# Patient Record
Sex: Male | Born: 1935 | Race: White | Hispanic: No | Marital: Married | Smoking: Former smoker
Health system: Southern US, Community
[De-identification: ages and names within clinical notes are randomized; demographics above are authoritative.]

## PROBLEM LIST (undated history)

## (undated) DIAGNOSIS — E559 Vitamin D deficiency, unspecified: Secondary | ICD-10-CM

## (undated) DIAGNOSIS — N200 Calculus of kidney: Secondary | ICD-10-CM

## (undated) DIAGNOSIS — E785 Hyperlipidemia, unspecified: Secondary | ICD-10-CM

## (undated) DIAGNOSIS — I219 Acute myocardial infarction, unspecified: Secondary | ICD-10-CM

## (undated) DIAGNOSIS — I10 Essential (primary) hypertension: Secondary | ICD-10-CM

## (undated) DIAGNOSIS — J449 Chronic obstructive pulmonary disease, unspecified: Secondary | ICD-10-CM

## (undated) DIAGNOSIS — I639 Cerebral infarction, unspecified: Secondary | ICD-10-CM

## (undated) DIAGNOSIS — N400 Enlarged prostate without lower urinary tract symptoms: Secondary | ICD-10-CM

## (undated) DIAGNOSIS — I2510 Atherosclerotic heart disease of native coronary artery without angina pectoris: Secondary | ICD-10-CM

## (undated) HISTORY — DX: Hyperlipidemia, unspecified: E78.5

## (undated) HISTORY — DX: Vitamin D deficiency, unspecified: E55.9

## (undated) HISTORY — DX: Benign prostatic hyperplasia without lower urinary tract symptoms: N40.0

## (undated) HISTORY — DX: Essential (primary) hypertension: I10

## (undated) HISTORY — DX: Calculus of kidney: N20.0

## (undated) HISTORY — PX: SHOULDER SURGERY: SHX246

## (undated) HISTORY — DX: Atherosclerotic heart disease of native coronary artery without angina pectoris: I25.10

## (undated) HISTORY — DX: Chronic obstructive pulmonary disease, unspecified: J44.9

## (undated) HISTORY — DX: Acute myocardial infarction, unspecified: I21.9

## (undated) HISTORY — PX: OTHER SURGICAL HISTORY: SHX169

---

## 1997-11-11 ENCOUNTER — Ambulatory Visit (HOSPITAL_COMMUNITY): Admission: RE | Admit: 1997-11-11 | Discharge: 1997-11-11 | Payer: Self-pay | Admitting: Cardiology

## 1998-03-22 ENCOUNTER — Ambulatory Visit (HOSPITAL_COMMUNITY): Admission: RE | Admit: 1998-03-22 | Discharge: 1998-03-22 | Payer: Self-pay | Admitting: *Deleted

## 1998-04-21 ENCOUNTER — Ambulatory Visit (HOSPITAL_COMMUNITY): Admission: RE | Admit: 1998-04-21 | Discharge: 1998-04-21 | Payer: Self-pay | Admitting: Gastroenterology

## 1999-03-28 ENCOUNTER — Ambulatory Visit (HOSPITAL_COMMUNITY): Admission: RE | Admit: 1999-03-28 | Discharge: 1999-03-28 | Payer: Self-pay | Admitting: *Deleted

## 1999-10-30 ENCOUNTER — Encounter: Payer: Self-pay | Admitting: Oncology

## 1999-10-30 ENCOUNTER — Ambulatory Visit (HOSPITAL_COMMUNITY): Admission: RE | Admit: 1999-10-30 | Discharge: 1999-10-30 | Payer: Self-pay | Admitting: Oncology

## 1999-11-09 ENCOUNTER — Encounter: Payer: Self-pay | Admitting: Specialist

## 1999-11-09 ENCOUNTER — Ambulatory Visit (HOSPITAL_COMMUNITY): Admission: RE | Admit: 1999-11-09 | Discharge: 1999-11-09 | Payer: Self-pay | Admitting: Specialist

## 2000-04-15 ENCOUNTER — Ambulatory Visit (HOSPITAL_COMMUNITY): Admission: RE | Admit: 2000-04-15 | Discharge: 2000-04-15 | Payer: Self-pay | Admitting: *Deleted

## 2000-05-07 ENCOUNTER — Ambulatory Visit: Admission: RE | Admit: 2000-05-07 | Discharge: 2000-05-07 | Payer: Self-pay | Admitting: Internal Medicine

## 2000-05-07 ENCOUNTER — Encounter (INDEPENDENT_AMBULATORY_CARE_PROVIDER_SITE_OTHER): Payer: Self-pay | Admitting: Specialist

## 2001-04-17 ENCOUNTER — Encounter: Payer: Self-pay | Admitting: Internal Medicine

## 2001-04-17 ENCOUNTER — Ambulatory Visit (HOSPITAL_COMMUNITY): Admission: RE | Admit: 2001-04-17 | Discharge: 2001-04-17 | Payer: Self-pay | Admitting: Internal Medicine

## 2002-07-08 LAB — HM COLONOSCOPY

## 2002-08-25 ENCOUNTER — Ambulatory Visit (HOSPITAL_COMMUNITY): Admission: RE | Admit: 2002-08-25 | Discharge: 2002-08-25 | Payer: Self-pay | Admitting: Internal Medicine

## 2002-08-25 ENCOUNTER — Encounter: Payer: Self-pay | Admitting: Internal Medicine

## 2003-06-24 ENCOUNTER — Ambulatory Visit (HOSPITAL_COMMUNITY): Admission: RE | Admit: 2003-06-24 | Discharge: 2003-06-24 | Payer: Self-pay | Admitting: Gastroenterology

## 2003-09-28 ENCOUNTER — Ambulatory Visit (HOSPITAL_COMMUNITY): Admission: RE | Admit: 2003-09-28 | Discharge: 2003-09-28 | Payer: Self-pay | Admitting: Internal Medicine

## 2003-12-14 ENCOUNTER — Observation Stay (HOSPITAL_COMMUNITY): Admission: EM | Admit: 2003-12-14 | Discharge: 2003-12-16 | Payer: Self-pay | Admitting: Emergency Medicine

## 2004-06-15 ENCOUNTER — Encounter: Admission: RE | Admit: 2004-06-15 | Discharge: 2004-06-15 | Payer: Self-pay | Admitting: Vascular Surgery

## 2004-09-07 ENCOUNTER — Encounter: Admission: RE | Admit: 2004-09-07 | Discharge: 2004-09-07 | Payer: Self-pay | Admitting: Vascular Surgery

## 2004-11-12 ENCOUNTER — Ambulatory Visit (HOSPITAL_COMMUNITY): Admission: RE | Admit: 2004-11-12 | Discharge: 2004-11-12 | Payer: Self-pay | Admitting: Internal Medicine

## 2005-03-22 ENCOUNTER — Encounter: Admission: RE | Admit: 2005-03-22 | Discharge: 2005-03-22 | Payer: Self-pay | Admitting: Vascular Surgery

## 2005-07-12 ENCOUNTER — Ambulatory Visit (HOSPITAL_COMMUNITY): Admission: RE | Admit: 2005-07-12 | Discharge: 2005-07-12 | Payer: Self-pay | Admitting: Internal Medicine

## 2005-09-27 ENCOUNTER — Encounter: Admission: RE | Admit: 2005-09-27 | Discharge: 2005-09-27 | Payer: Self-pay | Admitting: Vascular Surgery

## 2005-10-23 ENCOUNTER — Inpatient Hospital Stay (HOSPITAL_COMMUNITY): Admission: RE | Admit: 2005-10-23 | Discharge: 2005-10-27 | Payer: Self-pay | Admitting: Vascular Surgery

## 2005-10-23 HISTORY — PX: ABDOMINAL AORTIC ANEURYSM REPAIR: SUR1152

## 2005-10-24 ENCOUNTER — Encounter: Payer: Self-pay | Admitting: Vascular Surgery

## 2005-11-15 ENCOUNTER — Encounter: Admission: RE | Admit: 2005-11-15 | Discharge: 2005-11-15 | Payer: Self-pay | Admitting: Vascular Surgery

## 2006-03-07 ENCOUNTER — Encounter: Admission: RE | Admit: 2006-03-07 | Discharge: 2006-03-07 | Payer: Self-pay | Admitting: Vascular Surgery

## 2006-06-06 ENCOUNTER — Encounter: Admission: RE | Admit: 2006-06-06 | Discharge: 2006-06-06 | Payer: Self-pay | Admitting: Vascular Surgery

## 2006-10-10 ENCOUNTER — Encounter: Admission: RE | Admit: 2006-10-10 | Discharge: 2006-10-10 | Payer: Self-pay | Admitting: Vascular Surgery

## 2006-10-10 ENCOUNTER — Ambulatory Visit: Payer: Self-pay | Admitting: Vascular Surgery

## 2007-11-04 ENCOUNTER — Ambulatory Visit: Payer: Self-pay | Admitting: Vascular Surgery

## 2007-11-04 ENCOUNTER — Encounter: Admission: RE | Admit: 2007-11-04 | Discharge: 2007-11-04 | Payer: Self-pay | Admitting: Vascular Surgery

## 2008-10-19 ENCOUNTER — Ambulatory Visit: Payer: Self-pay | Admitting: Vascular Surgery

## 2009-10-18 ENCOUNTER — Ambulatory Visit: Payer: Self-pay | Admitting: Vascular Surgery

## 2009-10-18 ENCOUNTER — Encounter: Admission: RE | Admit: 2009-10-18 | Discharge: 2009-10-18 | Payer: Self-pay | Admitting: Vascular Surgery

## 2010-06-13 ENCOUNTER — Ambulatory Visit (HOSPITAL_COMMUNITY)
Admission: RE | Admit: 2010-06-13 | Discharge: 2010-06-13 | Payer: Self-pay | Source: Home / Self Care | Admitting: Internal Medicine

## 2010-06-29 ENCOUNTER — Ambulatory Visit (HOSPITAL_COMMUNITY)
Admission: RE | Admit: 2010-06-29 | Discharge: 2010-06-29 | Payer: Self-pay | Source: Home / Self Care | Attending: Internal Medicine | Admitting: Internal Medicine

## 2010-07-29 ENCOUNTER — Encounter: Payer: Self-pay | Admitting: Vascular Surgery

## 2010-11-02 ENCOUNTER — Encounter (INDEPENDENT_AMBULATORY_CARE_PROVIDER_SITE_OTHER): Payer: Medicare Other

## 2010-11-02 DIAGNOSIS — Z48812 Encounter for surgical aftercare following surgery on the circulatory system: Secondary | ICD-10-CM

## 2010-11-02 DIAGNOSIS — I714 Abdominal aortic aneurysm, without rupture: Secondary | ICD-10-CM

## 2010-11-09 NOTE — Procedures (Unsigned)
VASCULAR LAB EXAM  INDICATION:  Followup AAA endograft placed 10/23/2005.  HISTORY: Diabetes:  Yes. Cardiac:  MI. Hypertension:  No.  EXAM:  AAA sac size 3.3 cm AP, 3.9 cm transverse. Previous sac size 10/19/2008 3.3 cm AP, 4.4 cm transverse.  IMPRESSION: 1. The aorta and endograft appear patent. 2. No significant change in size of the aneurysmal sac surrounding the     endograft. 3. No evidence of endoleak was detected. 4. Limited visualization due to overlying bowel gas.  ___________________________________________ Jessy Oto. Oneida Alar, MD  EM/MEDQ  D:  11/05/2010  T:  11/05/2010  Job:  [id number]

## 2010-11-20 NOTE — Assessment & Plan Note (Signed)
OFFICE VISIT   [name], Isaiah Jackson  DOB:  [birth year]                                       11/04/2007  [id number]   The patient returns for followup today.  He had an aortic aneurysm stent  graft placed in April of 2007.  He has done well since then.  He has no  complaints of abdominal or back pain.  He is continuing to eat well.  He  has officially retired and is now mainly spending time at [employer] doing  some fishing   PHYSICAL EXAMINATION:  Vital signs:  On physical exam today blood  pressure is 123/69 in the left arm, pulse is 64 and regular.  Abdomen:  Abdomen is soft, nontender, no pulsatile mass.  Extremities:  He has 2+  femoral and dorsalis pedis pulses bilaterally.  Feet are pink, warm and  well-perfused.   He had a CT scan of the abdomen and pelvis performed today which showed  some thrombus lining the inner lumen of the stent at its midportion  primarily around the origins of the limbs of the graft.  This was  present 1 year ago and is unchanged.  The aneurysm diameter is 4.6 cm.  The aneurysm was originally 5.1 cm prior to stenting.  The stent begins  at the lower aspect of the superior mesenteric artery and is unchanged.  Renal arteries are patent bilaterally.   Overall the patient is doing well.  He is now 2 years out from his stent  graft repair.  We will follow up in 1 year with an abdominal aortic  ultrasound for stent grafts in our office.   Jessy Oto. Fields, MD  Electronically Signed   CEF/MEDQ  D:  11/04/2007  T:  11/05/2007  Job:  [id number]   cc:   Darcus Austin, D.O.  Ezzard Standing, M.D.

## 2010-11-20 NOTE — Procedures (Signed)
ENDOVASCULAR STENT GRAFT EXAM   INDICATION:  Stent graft repair of abdominal aortic aneurysm on  10/23/05.   HISTORY:                           DUPLEX EVALUATION   AAA Sac Size:                 3.3 CM AP             4.4 CM TRV  Previous Sac Size:            3.9 CM AP             4.6, 11/04/07 CM TRV  Evidence of an endoleak?      No                    No   Velocity Criteria:  Proximal Aorta                159 cm/sec  Proximal Stent Graft          127 cm/sec  Main Body Stent Graft-Mid     137 cm/sec  Right Limb-Proximal           118 cm/sec  Right Limb-Distal             118 cm/sec  Left Limb-Proximal            120 cm/sec  Left Limb-Distal              107 cm/sec  Patent Renal Arteries?        Yes                   Yes   IMPRESSION:  Patent aorto-iliac endostent with no evidence of endoleak.   ___________________________________________  Jessy Oto. Fields, MD   MC/MEDQ  D:  10/19/2008  T:  10/19/2008  Job:  [id number]

## 2010-11-20 NOTE — Procedures (Signed)
LOWER EXTREMITY ARTERIAL EVALUATION-SINGLE LEVEL   INDICATION:  Follow-up evaluation of aorto-iliac stent graft.   HISTORY:  Diabetes:  Yes.  Cardiac:  MI.  Hypertension:  No.  Smoking:  Patient is a former smoker for 40 years.  Previous Surgery:  Stent graft of abdominal aortic aneurysm in 10/2005.   RESTING SYSTOLIC PRESSURES: (ABI)                          RIGHT                LEFT  Brachial:               158                  160  Anterior tibial:        158                  164  Posterior tibial:       182 (>1.0)           180 (>1.0)  Peroneal:  DOPPLER WAVEFORM ANALYSIS:  Anterior tibial:        Biphasic             Triphasic  Posterior tibial:       Biphasic             Triphasic  Peroneal:   PREVIOUS ABI'S:  Date: 10/24/05  RIGHT:  >1.0  LEFT:  >1.0   IMPRESSION:  Ankle brachial indices are stable compared to previous  study bilaterally and suggest no significant arterial occlusive disease.   ___________________________________________  Jessy Oto. Fields, MD   MC/MEDQ  D:  10/19/2008  T:  10/19/2008  Job:  [id number]

## 2010-11-23 NOTE — Op Note (Signed)
Isaiah Jackson, CONSTABLE NO.:  [id number]   MEDICAL RECORD NO.:  [id number]          PATIENT TYPE:  INP   LOCATION:  3301                         FACILITY:  [facility]   PHYSICIAN:  Dorothea Glassman, M.D.    DATE OF BIRTH:  [birth year]   DATE OF PROCEDURE:  10/23/2005  DATE OF DISCHARGE:                                 OPERATIVE REPORT   SURGEON:  Gordy Clement, M.D.   ASSISTANT:  Ruta Hinds, M.D.   DIAGNOSIS:  A 5.1-cm abdominal aortic aneurysm.   PROCEDURE:  Open left common femoral artery exposure for placement of Zenith  bifurcated aortic stent graft.   OPERATIVE PROCEDURE:  The patient brought to the operating room in stable  condition.  Placed in supine position.  General endotracheal anesthesia  induced.  Abdomen and both legs prepped and draped in sterile fashion.   Oblique skin incision made through the left groin at the level of the  inguinal ligament.  Dissection carried through subcutaneous tissue with  electrocautery.  Deep dissection carried down to expose the inguinal  ligament.  The common femoral artery exposed and encircled with vessel loop  proximally.  Bridging veins were ligated with 3-0 silk and divided.  Common  femoral artery mobilized down to the origin of the profunda and superficial  femoral arteries and encircled with vessel loop distally.   The common femoral artery was soft, large, a good caliber, minimal disease.   A Zenith bifurcated aortic stent graft was then placed, dictated under  separate heading by Dr. Oneida Alar.   At completion of the aortic stent graft procedure, all sheaths and  guidewires and catheters were removed.  The left common femoral artery  controlled with DeBakey clamps proximally and distally.  The transverse  arteriotomy was closed with running 6-0 Prolene suture.  The wound then  irrigated with saline solution.  Subcutaneous tissue closed with deep layer  of running 2-0 Vicryl suture.  Subcutaneous layer  running 3-0 Vicryl suture.  Skin closed with 4-0 Monocryl.  Steri-Strips applied.  The patient remained  stable throughout the operative procedure.      Dorothea Glassman, M.D.  Electronically Signed     PGH/MEDQ  D:  10/23/2005  T:  10/24/2005  Job:  [id number]

## 2010-11-23 NOTE — Discharge Summary (Signed)
Isaiah Jackson, Isaiah Jackson NO.:  [id number]   MEDICAL RECORD NO.:  [id number]           PATIENT TYPE:   LOCATION:                               FACILITY:  [facility]   PHYSICIAN:  Jessy Oto. Fields, MD  DATE OF BIRTH:  Mar 02, 1934   DATE OF ADMISSION:  10/23/2005  DATE OF DISCHARGE:                                 DISCHARGE SUMMARY   ADMISSION DIAGNOSIS:  Abdominal aortic aneurysm.   DISCHARGE/SECONDARY DIAGNOSES:  1.  Abdominal aortic aneurysm.  2.  Hypertension.  3.  Diabetes mellitus, type 2.  4.  Coronary artery disease, status post PTA and stenting.  5.  History of myocardial infarction.  6.  Hyperlipidemia.  7.  Obesity.  8.  Benign prostatic hypertrophy with history of post catheterization,      urinary retention requiring Foley catheter placement.  9.  Nephrolithiasis with history of cystoscopy.   ALLERGIES:  No known drug allergies.   PROCEDURES:  October 23, 2005:  Venous aneurysm stent graft repair of  abdominal aortic aneurysm (main body venous graft 32 x 111, left limb 14 x  88, right limb 16 x 71).  Surgeons:  Isaiah Jackson and Isaiah Jackson.   BRIEF HISTORY AND PHYSICAL:  Isaiah Jackson is a 77 year old Caucasian male who  was found to have an abdominal aortic aneurysm in June of 2005 which  measured 5.2 cm.  The patient had been completely asymptomatic.  He was  referred to vascular surgeon Isaiah Jackson, who recommended  surveillance scans every six months.  He continued to follow up the regular  intervals and his aneurysm remained stable.  He recently underwent CT angio  on September 27, 2005, which revealed his aneurysm to be 5.1 cm in diameter with  26 mm diameter neck and adequate neck length for stent graft repair.  Once  again Isaiah Jackson felt that the patient's risk for rupture was low and he  could continue routine surveillance.  However, at the time the patient  desired to proceed with surgical intervention.  Isaiah Jackson discussed  a  repair alternative and ultimately it was felt he was a good candidate for  stent graft repair.  The patient was seen by Isaiah Jackson preoperatively and  was cleared to proceed with the surgery from a cardiac standpoint.   HOSPITAL COURSE:  On October 23, 2005, Isaiah Jackson was electively admitted to  [facility] and did undergo stent graft repair of his abdominal  aortic aneurysm.  At the time of this dictation he has remained stable  throughout his postoperative course, although he has been monitored for  leukocytosis with a white count peaking at 19,000 with follow-up white count  beginning to trend down.  There was a follow-up CBC ordered but is still  pending.  He has had a low grade temperature peaking at 100.7 which is felt  possibly secondary to atelectasis.  A urinalysis was checked to look for  possible other sources, but is negative for leukocyte nitrites.  At this  time he has not had a postoperative chest  x-ray but his lung sounds have  remained clear and his saturations have been __________ % on room air.  Postoperatively, Isaiah Jackson has been comfortable from a pain standpoint.  His incisions also appear to be healing well without signs of infection or  evidence of hematoma.  He has been able to mobilize without difficulty.  He  has been advanced to a regular carbohydrate modified diet and his home  medications have been resumed.  Initially it was anticipated he would be  ready for discharge home on postoperative day #2, but developed nausea and  at this point without vomiting.  He has had no abdominal pain and his  physical exam shows excellent bowel sounds, although his abdomen is  moderately distended.  It remains soft and nontender.  His nausea was  treated with Zofran and Phenergan p.r.n. and Dulcolax suppository was given  to simulate his bowel function.  He was encouraged to increase his  mobilization.  It was determined he should remain hospitalized for an   additional day to monitor his bowel function and to give his nausea a chance  to subside.  Will also follow up on his lab results, specifically his white  blood count and follow up his basic metabolic panel as well.  If his nausea  has resolved and there are no significant changes in status, it is  anticipated he will be ready for discharge home on postoperative day #3,  October 26, 2005.   LABORATORY DATA:  His most recent labs show a white blood count of 19,600,  hemoglobin 10.8, hematocrit of 30.8, platelet count 190.  Sodium 135,  potassium 3.5, chloride 105, CO2 25, blood glucose 145, BUN 15, creatinine  1.3, calcium 8.3.  A follow-up CBC and BMET have been ordered but are still  pending at the time of this dictation.  I previously had stated his white  blood count had been trending down; however, after further review, it  appears that his white blood count postoperatively had remained stable at  first 19.4, then increased to 19.6, again with the follow-up WBC still  pending.  His preoperative liver function tests were normal, showing a total  bilirubin of 0.3, alkaline phosphatase 57, AST 24, ALT of 18, total protein  6.2, and a__________ was 3.8.   DISCHARGE MEDICATIONS:  1.  Toprol XL 50 mg p.o. daily.  2.  Actos 45 mg p.o. daily.  3.  Lipitor 40 mg p.o. daily.  4.  Tricor 145 mg p.o. daily.  5.  Aspirin 81 mg p.o. daily.  6.  Oxycodone 5 mg 1-2 tablets p.o. q.4-6 h. p.r.n. pain.   DISCHARGE INSTRUCTIONS:   DIET:  He is to follow a diabetic low fat, low salt diet.   ACTIVITY:  He is to avoid driving or heavy lifting for the next two weeks.  He should increase his activity slowly and was encouraged to continue daily  walking and breathing exercises.   WOUND CARE:  He may shower and clean his incision gently with soap and  water.  He should notify the [facility] office if he develops fever greater than 101, redness or drainage from his incision site, or abdominal pain or   increasing abdominal distention, or nausea or vomiting.  Of note his feet  have remained well perfused during this hospitalization.   FOLLOW UP:  He is to follow up with Isaiah Jackson at the [place] office per stent  graft protocol and he will be contacted regarding a specific  appointment  date and time.      Jacinta Shoe, P.A.    ______________________________  Jessy Oto Fields, MD    AWZ/MEDQ  D:  10/25/2005  T:  10/26/2005  Job:  [id number]   cc:   Jessy Oto. [address], [place], [place] [address]   W. Tollie Eth, M.D.  Fax: [phone]  Email: [email or web address]   Darcus Austin, D.O.  Fax: [phone]

## 2010-11-23 NOTE — H&P (Signed)
NAMERAMIR, KALLAY NO.:  [id number]   MEDICAL RECORD NO.:  [id number]          PATIENT TYPE:  INP   LOCATION:  NA                           FACILITY:  [facility]   PHYSICIAN:  Jessy Oto. Fields, MD  DATE OF BIRTH:  1934-05-04   DATE OF ADMISSION:  DATE OF DISCHARGE:                                HISTORY & PHYSICAL   REASON FOR ADMISSION:  Abdominal aortic aneurysm.   HISTORY OF PRESENT ILLNESS:  The patient is a 77 year old white male who was  found in June 2005 to have an abdominal aortic aneurysm which at that time  measured 5.2 cm.  The patient been completely asymptomatic.  He was seen in  consultation by Dr. Ruta Hinds, and because he had been asymptomatic and  based on the size of the aneurysm, Dr. Oneida Alar recommended follow-up  surveillance scans every six months.  He has continued to follow up at  regular intervals, and his aneurysm has remained stable.  He continues to  remain asymptomatic as well, denying abdominal pain or back pain.  He  recently underwent a CT angiogram on September 27, 2005 which revealed his  aneurysm to be 5.1 cm in diameter with 26-mm diameter neck and adequate neck  length for stent graft repair.  Once again, Dr. Oneida Alar felt that the  patient's risk of rupture was low and that he can continue routine  surveillance; however, at this time, the patient desires to proceed with  surgical intervention.  Dr. Oneida Alar has discussed the risks, benefits and  alternatives of repair, and based on his discussions with the patient, it  was decided to proceed with stent graft repair at this time.  Mr. Sport  has been seen by Dr. Wynonia Lawman preoperatively and has been cleared to proceed  with surgery from a cardiac standpoint.  He presents today for preoperative  history and physical.   PAST MEDICAL HISTORY:  1.  History of hypertension, currently on no medications secondary to      dizziness with his medications.  2.  Type 2 diabetes mellitus.  3.  Coronary artery disease status post PTA and stent.  4.  History of MI.  5.  Hyperlipidemia  6.  Obesity.  7.  History of benign prostatic hypertrophy with a history of post      catheterization urinary retention requiring Foley catheter placement.  8.  History of kidney stones.   PAST SURGICAL HISTORY:  Cystoscopy for kidney stones.   ALLERGIES:  No known drug allergies.   MEDICATIONS:  1.  Actos 45 mg q.d.  2.  Lipitor 40 mg q.d.  3.  TriCor 145 mg q.d.  4.  Aspirin 81 mg q.d.  5.  Toprol XL 50 mg; this was started one week prior to surgery by Dr.      Wynonia Lawman and is to be continued one week postoperatively   SOCIAL HISTORY:  He is married and resides with his wife in [place].  He  has two children.  He previously smoked one pack of cigarettes per day for  30 years and quit in 1992.  He does not consume alcohol.  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   FAMILY HISTORY:  His mother died age at 65 of a myocardial infarction.  She  also had diabetes.  His father died when the patient was quite young an  accidental death.  He has one sister living who has diabetes and one sister  living of whose medical history he is unaware.  He has one daughter who has  diabetes as well.   REVIEW OF SYSTEMS:  See history of present illness for pertinent positives  and negatives.  Also, he has had problems recently with the swelling in his  right knee and is scheduled for further workup once the aneurysm repair is  completed.  He has occasional dizzy spells, but this have improved since he  was been taken off his hypertensive medications.  He wears glasses.  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  He complains of nocturia.  He also has  hemorrhoids.  He has some arthritis which he treats conservatively.  He  denies fevers, chills, weight loss, recent infections, TIA symptoms, visual  changes, amaurosis fugax, dysphagia, dysarthria, syncope,  chest pain,  palpitations shortness of breath, dyspnea on exertion, orthopnea, PND,  hemoptysis, hematemesis, hematochezia, melena, abdominal pain, nausea,  vomiting, diarrhea, constipation, reflux symptoms, dysuria, hematuria, lower  extremity edema, lower extremity claudication symptoms, rest pain,  nonhealing ulcers, intolerance to heat or cold, anxiety, depression, muscle  or joint problems.   PHYSICAL EXAMINATION:  VITAL SIGNS:  Blood pressure is 118/70, heart rate 60  and regular, respirations 16 and unlabored.  GENERAL:  This is a well-developed, well-nourished white male in no acute  distress.  HEENT: Normocephalic, atraumatic.  Pupils equal, round and reactive to light  and accommodation.  Extraocular movements intact.  TMs and canals are clear.  Oropharynx is clear with moist mucous membranes and upper and lower dentures  in place.  NECK:  Supple without lymphadenopathy, thyromegaly or carotid bruits.  HEART:  Regular rate and rhythm without murmurs, rubs or gallops.  LUNGS:  Clear to auscultation.  ABDOMEN:  Soft, nontender, nondistended, obese with active bowel sounds in  all quadrants.  There is a pulsatile midline mass to deep palpation.  EXTREMITIES:  No clubbing, cyanosis or edema.  He has 2+ femoral, dorsalis  pedis and posterior tibial pulses bilaterally.  NEUROLOGIC:  Cranial nerves II-XII grossly intact.  He is alert and oriented  x3.  His gait is within normal limits.  Muscle strength is 5+ in the upper  and lower extremities and symmetrical.   ASSESSMENT/PLAN:  This is a 77 year old male with an asymptomatic abdominal  aortic aneurysm.  He will be admitted to [facility] on October 23, 2005 for stent graft repair by Dr. Ruta Hinds.      Suzzanne Cloud, P.A.    ______________________________  Jessy Oto. Fields, MD    GC/MEDQ  D:  10/18/2005  T:  10/18/2005  Job:  [id number]  cc:   Darcus Austin, D.O.  [phone]   W. Tollie Eth, M.D.   Fax: [phone]  Email: [email or web address]

## 2010-11-23 NOTE — Op Note (Signed)
NAMEBONNER, Isaiah NO.:  [id number]   MEDICAL RECORD NO.:  [id number]          PATIENT TYPE:  INP   LOCATION:  3301                         FACILITY:  [facility]   PHYSICIAN:  Jessy Oto. Fields, MD  DATE OF BIRTH:  06-07-1934   DATE OF PROCEDURE:  10/23/2005  DATE OF DISCHARGE:                                 OPERATIVE REPORT   PROCEDURE:  Zenith aneurysm stent graft repair of abdominal aortic aneurysm.   PREOPERATIVE DIAGNOSIS:  Abdominal aortic aneurysm.   POSTOPERATIVE DIAGNOSIS:  Abdominal aortic aneurysm.   ANESTHESIA:  General.   INDICATIONS:  The patient is a 77 year old male with history of a 5.1-cm  abdominal aortic aneurysm.  He now presents for elective repair.  Options of  waiting until the aneurysm was 5.5 cm diameter were explained to the patient  but he is very anxious about the aneurysm and wishes to have it repaired  electively at this time.   OPERATIVE FINDINGS:  1.  Main body Zenith graft 32 x 111.  2.  Left limb 14 x 88.  3.  Right limb 16 x 71.   OPERATIVE DETAIL:  After obtaining informed consent, the patient taken the  operating room.  The patient placed supine position operating table.  After  induction of general anesthesia endotracheal intubation, a Foley catheter  was placed.  Next the patient's abdomen was prepped and draped usual sterile  fashion.  Bilateral groin incisions were made over the common femoral  artery.  The left common femoral artery dissection then repair is dictated  as separate operative note by Dr. Amedeo Plenty.  On the right side incision was  carried down through subcutaneous tissues down to level of right common  femoral artery.  This dissected free circumferentially and controlled  proximally and distally with vessel loops.  Next a Majestic needle was used  to cannulate the right common femoral artery.  A 0.035 J-tip guidewire was  threaded through the right common femoral artery up into the abdominal aorta  under fluoroscopic guidance.  Next the 8-French sheath was placed over the  guidewire into the right iliac system.  An H1 catheter was then threaded  over the guidewire through the sheath and the J-wire exchange for a 0.035  Amplatz wire.  Next attention was turned to left side.  The left common  femoral artery was cannulated with a Majestic needle.  A 0.035 J-tip  guidewire was then threaded up into the abdominal aorta and a 8-French  sheath placed over the guidewire into the left common femoral artery.  A 5-  French pigtail catheter was then placed over the guidewire into the  abdominal aorta.  Tip of the Amplatz wire was measured to be in the mid  thoracic aorta.  Next a 32 x 111 main body device was brought up to the  operative field and oriented with the gate in a slightly anterior fashion.  The 8-French sheath was removed from the right common femoral artery and the  main body device threaded over the guidewire up into the abdominal aorta.  A  contrast arteriogram  was then obtained using a pigtail catheter to show the  precise location of the renal arteries.  Top portion of the main body the  graft was then deployed in the usual fashion.  This was done with 10 degrees  of craniocaudal angulation.  After the main body device had been deployed  down to the level of the gate.  This was inspected and found to be in good  position below the renal arteries and the suprarenal stent was deployed.  Next the pigtail catheter was pulled down into the left common iliac system.  Using a guidewire this was then threaded into the contralateral limb and the  pigtail catheter advanced up into the abdominal aorta.  This was then  confirmed to be within the lumen of the stent by twirling the catheter as  well as checking lateral fluoroscopy.  A retrograde arteriogram was obtained  through the sheath in the left groin to confirm that a 14 x 88 limb would be  adequate coverage of the left iliac system.   This was then brought up on the  operative field.  The 8-French sheath was removed over the guidewire as well  as the pigtail catheter.  14 x 88 limb was then placed over the guidewire in  the left iliac system and deployed with one and a half stent overlap in the  usual fashion.  Attention was then turned back to the right femoral artery.  The remainder of the ipsilateral limb was deployed and the trigger wire  removed.  The remainder of the ipsilateral limb was then deployed.  The  dilator was then recaptured in the usual fashion.  This was then removed and  a pigtail catheter placed over the guidewire and again a retrograde contrast  angiogram was obtained to show the precise level of the internal iliac  artery on the right side.  Contrast angiogram had also been obtained in the  left iliac system retrograde to determine level of hypogastric artery.  A 16  x 71 limb was selected for the right side.  This was then deployed with  approximately two-stent overlap above the level of the internal iliac  artery.  The dilator was then recaptured.  A Coda molding balloon was then  brought up on the operating field and placed over the guidewire and the  proximal portion of the stent was tacked with this.  Also all joints on the  left and right side were also tacked as well as the distal endpoint.  5-  French pigtail catheter was then placed over the guidewire up into the  abdominal aorta and a completion arteriogram was obtained.  This showed no  evidence of type 2 or type 1 proximal or distal endo leak.  Stent graft was  in good position and both internal iliac arteries were widely patent as well  as both renal arteries.  At this point all guidewires and sheaths were  removed.  The femoral arteries were clamped proximally.  As stated above,  repair of the left artery is dictated by Dr. Amedeo Plenty.  The right common femoral artery was closed with interrupted 6-0 Prolene suture.  Subcutaneous  tissues  reapproximated with running 2-0 Vicryl suture.  The deep layers  closed running 3-0 Vicryl suture.  The skin closed with 4-0 Vicryl  subcuticular stitch.  A left groin was closed in similar fashion.  The  patient tolerated procedure well and there were no complications.  Instrument, sponge, needle counts were correct  at the end of the case.  The  patient was taken the recovery room in stable condition with warm feet with  palpable pedal pulses.           ______________________________  Jessy Oto Fields, MD     CEF/MEDQ  D:  10/23/2005  T:  10/24/2005  Job:  [id number]

## 2010-11-23 NOTE — Cardiovascular Report (Signed)
NAMEELSIE, REE                         ACCOUNT NO.:  [id number]   MEDICAL RECORD NO.:  [id number]                   PATIENT TYPE:  INP   LOCATION:  [id number]                                 FACILITY:  [facility]   PHYSICIAN:  W. Doristine Church., M.D.         DATE OF BIRTH:  [birth year]   DATE OF PROCEDURE:  12/15/2003  DATE OF DISCHARGE:  12/16/2003                              CARDIAC CATHETERIZATION   HISTORY:  A 77 year old male with previous stenting of the LAD years ago for  an anterior infarct.  He presented with increasing nausea, dizziness,  diaphoresis and had a myocardial infarction ruled out.  He also had some  abdominal pain and was found to have a 5.2 cm aneurysm.  He is admitted at  this time for catheterization to exclude unstable angina.   PROCEDURE:  Left heart catheterization with coronary angiogram, left  ventriculogram, distal aortogram.   COMMENTS ABOUT PROCEDURE:  The patient tolerated the procedure well without  complications.  A distal aortogram was obtained following the procedure  without complications.  He had good hemostasis and peripheral pulses noted  following the procedure.   HEMODYNAMIC DATA:  1. Aorta postcontrast:  140/72.  2. LV postcontrast:  140/18-21.   ANGIOGRAPHIC DATA:   LEFT VENTRICULOGRAM:  Performed in the 30 degree RAO projection.  The aortic  valve was normal.  The mitral valve was normal.  The left ventricle appears  normal in size.  There is  moderate anterior hypokinesis noted.  Estimated  ejection fraction is 40-45%.  Coronary arteries arise and distribute  normally.  There is calcification noted involving the left and the right  coronary arteries.  Left main coronary artery is calcified with mild  narrowing.  Left anterior descending previous Cook stent is seen and is  widely patent.  There is no significant residual stenosis noted.  There is  mild-to-moderate 30% irregularity with a somewhat small distal vessel that  terminates in the distal anterior wall.   Circumflex coronary artery:  Single marginal artery has mild-to-moderate 30%  diffuse disease.   Right coronary artery is a large dominant vessel.  There is diffuse 30%  irregularity in the proximal and mid portion of the vessel.   The posterior descending extends to the apex.  Several posterior lateral  branches arise which are free of disease.   Distal aortogram shows renal arteries without significant stenosis.  There  is a somewhat fusiform aneurysmal dilation of the aorta diffusely with a  focal area just below the renal arteries.   IMPRESSION:  1. Coronary artery disease with mild-to-moderate diffuse disease, residual,     with long term patency of the stented     site to the left anterior descending.  2. Abnormal left ventricular function with anterior hypokinesis.  3. Distal abdominal aortic aneurysm.  Kerry Hough., M.D.    WST/MEDQ  D:  12/15/2003  T:  12/16/2003  Job:  [id number]   cc:   Darcus Austin, D.O.  [address]  [place]  [place] [address]  Fax: [phone]

## 2010-11-23 NOTE — Op Note (Signed)
NAME:  Isaiah Jackson, Isaiah Jackson                         ACCOUNT NO.:  [id number]   MEDICAL RECORD NO.:  [id number]                   PATIENT TYPE:  AMB   LOCATION:  ENDO                                 FACILITY:  [facility]   PHYSICIAN:  James L. Rolla Flatten., M.D.          DATE OF BIRTH:  [birth year]   DATE OF PROCEDURE:  06/24/2003  DATE OF DISCHARGE:                                 OPERATIVE REPORT   PROCEDURE:  Colonoscopy.   MEDICATIONS:  Fentanyl 25 mcg, Versed 5 mg IV.   SCOPE:  Olympus pediatric adjustable colonoscope.   INDICATIONS FOR PROCEDURE:  Colon cancer screening.   DESCRIPTION OF PROCEDURE:  The procedure had been explained to the patient  and consent obtained. With the patient in the left lateral decubitus  position, the Olympus pediatric adjustable scope was inserted and advanced.  The prep was excellent. We were able to reach the cecum without difficulty.  The ileocecal valve and appendiceal orifice were seen. The scope was  withdrawn and the cecum, ascending colon, transverse colon, descending and  sigmoid colon were seen well. No polyps or other lesions were seen. The  scope was withdrawn. The patient tolerated the procedure well.   ASSESSMENT:  Normal screening colonoscopy, V76.51.   PLAN:  Routine colonoscopy instructions.  Recommend yearly Hemoccults,  consider another screen in 10 years.                                               James L. Rolla Flatten., M.D.    Jaynie Bream  D:  06/24/2003  T:  06/24/2003  Job:  [id number]   cc:   Darcus Austin, D.O.  [address]  [place]  [place] [address]  Fax: [phone]

## 2010-11-23 NOTE — H&P (Signed)
Isaiah Jackson, Isaiah Jackson                         ACCOUNT NO.:  [id number]   MEDICAL RECORD NO.:  [id number]                   PATIENT TYPE:  INP   LOCATION:  1826                                 FACILITY:  [facility]   PHYSICIAN:  W. Doristine Church., M.D.         DATE OF BIRTH:  05-Mar-1934   DATE OF ADMISSION:  12/14/2003  DATE OF DISCHARGE:                                HISTORY & PHYSICAL   CHIEF COMPLAINT:  Diaphoresis, chest and back pain and nausea.   HISTORY OF PRESENT ILLNESS:  This 77 year old male has a prior history of  hypertension, diabetes and hyperlipidemia.  He several years ago had an  anterior myocardial infarction treated with angioplasty and later had  stenting of the LAD.  He had done well from a cardiac viewpoint, but had  recently been diagnosed with diabetes over the past year.  He presented with  a three to four day episode of episodic spells of dizziness.  He would  become dizzy, nauseous and feel like he might pass out.  One day he had cut  the grass at his daughter's house and shortly afterwards became severely  nauseous, dizziness, broke into a cold sweat and felt as if he might pass  out and went home.  Last evening, after getting a hair cut, he was coming  home and had severe nausea.  He went home, broke out into a diffuse sweat  and had significant nausea and then mid and low back pain.  He laid down but  had then some continued aching in his lower back as well as cramping in his  right leg.  He has not had any arm pain, but because of the symptoms he went  and saw him primary doctor who asked him to see me and he is admitted at  this time to evaluate for a possible unstable angina or acute coronary  syndrome.  He has been having increasing shortness of breath and had been  recently called back to work from his previous employer, but had to cut his  hours back because he did not feel well.   PAST MEDICAL HISTORY:  1. Diabetes.  2. Hypertension.  3.  Hyperlipidemia.  4. Obesity.   PAST SURGICAL HISTORY:  None.   ALLERGIES:  None.   CURRENT MEDICATIONS:  1. Lipitor daily.  2. Tricor daily.  3. Aspirin daily.  4. Actos 15 mg daily.  5. Atenolol daily.   FAMILY HISTORY:  Father died of trauma.  Mother died of diabetes and heart  disease.  Three sisters are living.  One brother died several years ago.   SOCIAL HISTORY:  He has been married.  He quit smoking in 1994 at the time  of his heart attack.  He is a semi-retired Clinical biochemist for [employer]  He does not use alcohol to excess.   REVIEW OF SYMPTOMS:  GENERAL:  His weight tends to fluctuate.  ENDOCRINE:  He has no diabetic retinopathy, glaucoma, cataracts or macular degeneration.  HEENT:  He has no difficulty hearing and no difficulty swallowing.  He has  no eye, ear, nose or throat complaints otherwise.  PULMONARY:  He has a  history of a chronic cough and has been receiving treatment for an upper  respiratory infection recently through Dr. Franklyn Lor office.  CARDIAC:  He  does not have palpitations and does not have definite syncope.  GI:  He  denies any diarrhea, constipation, hematochezia or melena.  GU:  He has no  dysuria, urgency, frequency or hematuria.  MUSCULOSKELETAL:  He does have  arthritis that involves his left knee.  He complains of some cramping  involving his right leg, but no claudication.  NEUROLOGIC:  He has had some  history of headaches but no history of chronic strokes.  He did have a  bronchoscopy several years ago for a possibly abnormal chest x-ray and cough  that was unremarkable.  Other than as noted above, the remainder of the  review of systems is unremarkable.   PHYSICAL EXAMINATION:  GENERAL:  On examination he is a pleasant, obese male  who is currently in no acute distress.  VITAL SIGNS:  Blood pressure was 120/70, pulse was 45.  SKIN: Warm and dry.  HEENT:  There is an erythematous rash involving the right side of the  face.  He has a slightly balding male hair pattern. Eyes:  He wears glasses.  Extraocular movements are intact.  Pupils equal, round and reactive to light  and accommodation.  CNS is clear.  Fundi are unremarkable.  Pharynx is  negative.  The neck is supple without masses, JVD, thyromegaly or bruits.  Lymph nodes are normal.  LUNGS:  Clear to auscultation and percussion.  CARDIAC:  Normal S1 and S2, slow rhythm, no S3 or murmur.  ABDOMEN:  Soft and non-tender.  No masses, hepatosplenomegaly or aneurysm  palpated.  EXTREMITIES:  Femoral pulses are 2+.  Dorsalis pedis and posterior tibialis  pulses are 2+.  There is no edema noted.  NEUROLOGIC:  Normal.  GU AND RECTAL EXAMS:  Deferred due to cardiac condition.   LABORATORY DATA:  EKG shows sinus rhythm with bradycardia, nonspecific ST  and T wave changes, possible old anterior infarction.   Hemoglobin 14.1, hematocrit 40.7, white count 10,300.  PT and PTT are  normal.  Chemistry panel shows a glucose of 153, sodium 134, potassium 3.5,  chloride 103.  Liver enzymes are all normal.   Chest x-ray portable was normal.   IMPRESSION:  1. Atypical back pain with nausea, diaphoresis and near syncope; rule out     arrhythmia, rule out unstable angina or presentation of ischemia.  2. Coronary artery disease with previous stenting fo the left anterior     descending and previous anterior infarction.  3. Hypertension.  4. Hyperlipidemia, under treatment.  5. Type 2 diabetes, under treatment.  6. Obesity.   RECOMMENDATIONS:  1. The patient is brought in at this time and will have a CT scan of the     chest and abdomen to rule out dissection of     the aorta or pulmonary embolus.  2. Serial enzymes will be obtained.  3. Likely consider repeat catheterization if he rules out for a myocardial     infarction.  Kerry Hough., M.D.   WST/MEDQ  D:  12/14/2003  T:  12/14/2003  Job:   [id number]   cc:   Darcus Austin, D.O.  [address]  [place]  [place] [address]  Fax: [phone]

## 2010-11-23 NOTE — Assessment & Plan Note (Signed)
OFFICE VISIT   DAMARIS, REHL  DOB:  August 31, 1933                                       10/18/2009  [id number]   The patient is a 77 year old male who previously underwent aneurysm  stent graft repair in 2007.  This was done with a Zenith graft.  He  returns for followup today.  He has no new medical problems since his  last office visit.  He has no complaints of abdominal or back pain.  He  has no symptoms of claudication.   CHRONIC MEDICAL PROBLEMS:  Include elevated cholesterol and diabetes.  These are currently controlled.   REVIEW OF SYSTEMS:  He is 6 foot 1, 243 pounds.  He denies any shortness  of breath.  He denies any chest pain.   FAMILY HISTORY:  Unremarkable.   SOCIAL HISTORY:  He is retired.  He is a former smoker and quit in 1994.  He is retired.   PHYSICAL EXAM:  Blood pressure 147/75 in the left arm, heart rate 54 and  regular.  Temperature is 97.8.  Chest is clear to auscultation.  Cardiac  exam is regular rate and rhythm.  Abdomen is soft, nontender,  nondistended.  No palpable masses.  He has 2+ femoral pulses  bilaterally.  He has a 2+ right dorsalis pedis pulse and a 2+ left  dorsalis pedis pulse.  He has absent left dorsalis pedis pulse and  absent right posterior tibial pulse.   He had a CT angiogram today which showed the top of his stent is 2.5 mm  below the origin of the superior mesenteric artery.  This is unchanged  from its previous position.  The aneurysm is well excluded with no  change in size.  There is no evidence of endoleak.   In summary, overall, the patient continues to do well.  He will continue  yearly followup and we will do an ultrasound next year.     Jessy Oto. Fields, MD  Electronically Signed   CEF/MEDQ  D:  10/19/2009  T:  10/19/2009  Job:  [id number]   cc:   Darcus Austin, D.O.  Ezzard Standing, M.D.

## 2010-11-23 NOTE — Cardiovascular Report (Signed)
NAMEHOUGHTON, ABELAR                         ACCOUNT NO.:  [id number]   MEDICAL RECORD NO.:  [id number]                   PATIENT TYPE:  INP   LOCATION:  [id number]                                 FACILITY:  [facility]   PHYSICIAN:  W. Doristine Church., M.D.         DATE OF BIRTH:  02-Nov-1933   DATE OF PROCEDURE:  12/15/2003  DATE OF DISCHARGE:  12/16/2003                              CARDIAC CATHETERIZATION   HISTORY:  A 77 year old male with previous anterior infarction and stenting  of the LAD presented with episodes of diaphoresis, nausea and weakness with  symptoms consistent with possible unstable angina.  He also had mid back  pain and has been found to have an abdominal aneurysm of 5.2 cm.  He is  brought in at this time for catheterization to exclude unstable angina.   PROCEDURE:  Left heart catheterization with coronary angiogram and left  ventriculogram.   COMMENTS ABOUT PROCEDURE:  The patient tolerated the procedure well without  complications.  Following the procedure, he had good hemostasis and good  peripheral pulses noted.  A distal aortogram was performed and he tolerated  the procedure well.   HEMODYNAMIC DATA:  Dictation ended at this point.                                               Kerry Hough., M.D.    WST/MEDQ  D:  12/15/2003  T:  12/16/2003  Job:  [id number]   cc:   Darcus Austin, D.O.  [address]  [place]  [place] [address]  Fax: [phone]

## 2010-11-23 NOTE — Discharge Summary (Signed)
Isaiah Jackson, Isaiah Jackson                         ACCOUNT NO.:  [id number]   MEDICAL RECORD NO.:  [id number]                   PATIENT TYPE:  INP   LOCATION:  3740                                 FACILITY:  [facility]   PHYSICIAN:  Kerry Hough., M.D.         DATE OF BIRTH:  06-29-1934   DATE OF ADMISSION:  12/14/2003  DATE OF DISCHARGE:  12/16/2003                                 DISCHARGE SUMMARY   FINAL DIAGNOSES:  1. Diaphoresis, nausea, presyncope, myocardial infarction ruled out.     A. Catheterization showing widely patent stent site to the LAD and only        mild to moderate diffuse disease elsewhere.  No severe focal        structures or stenoses noted.  2. Hypertension.  3. Type II diabetes.  4. Hyperlipidemia.  5. Obesity.  6. Abdominal aortic aneurysm.   PROCEDURES:  1. CT scan of chest and abdomen.  2. Cardiac catheterization.   HISTORY:  This 77 year old male has a prior history of an anterior  myocardial infarction, treated with angioplasty and later stenting of the  LAD in 1996.  He was recently diagnosed with diabetes.  He presented with a  three to four day episode of episodic dizziness, where he would become  dizzy, nauseous, and felt like he might pass out.  He has had significant  diaphoresis associated with it, and last evening prior to admission had mid  and low back pain accompanied with nausea.  He had aching in his lower back  as well as cramping in his right leg.  He had no arm pain and was seen by  his primary doctor and I saw him in the emergency room and he was admitted  to rule out an myocardial infarction.  Please see the previously dictated  history and physical for the remainder of the details.   HOSPITAL COURSE:  Laboratory data on admission shows a normal CBC.  Hemoglobin is 14, hematocrit is 40.7.  Chemistry panel shows a sodium of  134, potassium 3.5, normal BUN and creatinine, glucose of 153.  PT and PTT  were normal.  Liver enzymes  were normal.  Lipid panel shows a total  cholesterol of 121.  HDL cholesterol of 44.  LDL cholesterol 48.  Triglycerides of 144.  Serial CPK and troponins were normal.  The day of  admission he underwent a spiral CT scan of his chest and abdomen.  He had  cardiomegaly noted.  There was mild ectasia of the descending thoracic aorta  noted.  There were upper limits of normal hilar mediastinal nodes that have  previously been worked up.  There is a 2.7 x 1.7 focal opacity in the right  posterior lung lobe.  The remainder of the lobes were clear.  There is a  focal infra-renal suprailiac abdominal aortic aneurysm, measuring 4.7 x 5.2  cm in greatest diameter.  There was a moderate  enlargement of  atherosclerotic plaque on the left posterior aspect of the aneurysm.  Chest  x-ray shows cardiomegaly.   The patient was given Lovenox on admission.  An EKG was unremarkable.  He  had no recurrence of  symptoms and remained bradycardiac.  He was taken to  the cardiac cath lab the next day and had findings of abnormal LV function  with a moderate anterior hypokinesis noted.  His previously stented site in  the LAD was widely patent.  Circumflex had 30 - 40 percent proximal  narrowing and the right coronary artery diffuse 30 percent narrowing, but no  severe focal obstructions or stenoses were noted anywhere.  He was seen in  consultation by Dr. Oneida Alar, who felt as if his symptoms had completely  resolved, and he doubted that it was related to the abdominal aneurysm.  He  felt that the risk of rupture at 5.2 cm was less than 5% per year.  He was  given signs and symptoms to watch for, and was recommended to have a follow  up ultrasound and an appointment with him in six months.  He felt that he  had a high thrombus burden, but he felt that this was usual, asymptomatic as  was in his case, and it was not an indication for repair.  The results were  discussed with the patient.  The etiology of his spells  is uncertain.  It  was not felt to be ischemic, and we wondered whether it could be arrhythmic  because of the bradycardia that he had.  Because of this, I stopped his  Atenolol and he will be discharged today on Lipitor daily, Tricor 145 mg  daily, Actos 15 mg daily, and aspirin.  He is to follow up with me in one  week, and he is to call if there are problems.                                                Kerry Hough., M.D.    WST/MEDQ  D:  12/16/2003  T:  12/16/2003  Job:  [id number]   cc:   Jessy Oto. [place], [place], [place] [address]   Darcus Austin, [address], [place]  [place]  [place] [address]  Fax: [phone]

## 2010-11-23 NOTE — Procedures (Signed)
[facility]  Patient:    Isaiah Jackson, Isaiah Jackson                      MRN: [id number] Proc. Date: 05/07/00 Adm. Date:  [id number] Attending:  Arturo Morton CC:         Kirtland Bouchard, M.D.   Procedure Report  PROCEDURE:  Fiberoptic bronchoscopy with Wang biopsy of the carina.  SURGEON:  Christena Deem. Melvyn Novas, M.D.  INDICATIONS:  This is a 77 year old white male, former smoker with incidentally noted fullness in the right hilum by plain film associated with nonspecific adenopathy.  Because of the history of smoking and adenopathy, the differential diagnosis includes bronchogenic carcinoma.  Therefore, bronchoscopy was felt to be indicated for exploration of endobronchial anatomy and tissue diagnosis if possible.  DESCRIPTION OF PROCEDURE:  The procedure was performed in the bronchoscopy suite with continuous monitoring by surface ECG and oximetry.  The patient maintained greater than 90% saturations throughout the procedure in sinus rhythm.  He received a total of 5 mg of IV Versed and 50 mg of IV Demerol for adequate sedation and cough suppression.  The right nares and oropharynx were anesthetized with 10% lidocaine spray.  The right nares was additional _______ with 2% lidocaine jelly.  Using a standard flexible fiberoptic bronchoscope, the right nares was easily cannulated with good visualization of the entire oropharynx and larynx.  The cords moved normally and there were upper airway lesions identified.  Using additional 1% lidocaine as needed, the entire tracheal bronchial tree was explored bilaterally with the following findings:  1. There were nonspecific changes in the airways consistent with mild chronic    bronchitis.  There were no focal endobronchial lesions with all airways    opening widely to the subsegmental ______ bilaterally.  The right airways    were inspected twice and all the airway dividers appeared relatively  sharp.  Therefore, using a standard Wang transtracheal approach, the carina was biopsied x 2 with adequate sampling.  The patient tolerated the procedure well.  IMPRESSION: 1. No evidence of an endobronchial lesion. 2. Mediastinal node sampled by Mina Marble technique.  Most likely these will be    nonspecific and the recommendation probably will be simply to follow him    serially, even if this is a "early" bronchogenic carcinoma with extensive    mediastinal and hilar adenopathy, it would not be considered resectable for    cure.  The best case scenario is that this simply represents reactive    adenopathy and will not evolve over time any worsening changes to suggest    an underlying malignancy. DD:  05/07/00 TD:  05/07/00 Job: [id number]

## 2011-04-29 ENCOUNTER — Other Ambulatory Visit (HOSPITAL_COMMUNITY): Payer: Self-pay | Admitting: Internal Medicine

## 2011-04-29 ENCOUNTER — Ambulatory Visit (HOSPITAL_COMMUNITY)
Admission: RE | Admit: 2011-04-29 | Discharge: 2011-04-29 | Disposition: A | Discharge status: Home / Self Care | Payer: Medicare Other | Source: Ambulatory Visit | Attending: Internal Medicine | Admitting: Internal Medicine

## 2011-04-29 DIAGNOSIS — Z87891 Personal history of nicotine dependence: Secondary | ICD-10-CM | POA: Insufficient documentation

## 2011-04-29 DIAGNOSIS — J4489 Other specified chronic obstructive pulmonary disease: Secondary | ICD-10-CM | POA: Insufficient documentation

## 2011-04-29 DIAGNOSIS — R05 Cough: Secondary | ICD-10-CM

## 2011-04-29 DIAGNOSIS — R059 Cough, unspecified: Secondary | ICD-10-CM

## 2011-04-29 DIAGNOSIS — J449 Chronic obstructive pulmonary disease, unspecified: Secondary | ICD-10-CM | POA: Insufficient documentation

## 2011-10-25 ENCOUNTER — Encounter: Payer: Self-pay | Admitting: Vascular Surgery

## 2011-11-06 ENCOUNTER — Encounter: Payer: Self-pay | Admitting: Vascular Surgery

## 2011-11-07 ENCOUNTER — Encounter: Payer: Self-pay | Admitting: Vascular Surgery

## 2011-11-07 ENCOUNTER — Ambulatory Visit (INDEPENDENT_AMBULATORY_CARE_PROVIDER_SITE_OTHER): Payer: Medicare Other | Admitting: *Deleted

## 2011-11-07 ENCOUNTER — Ambulatory Visit (INDEPENDENT_AMBULATORY_CARE_PROVIDER_SITE_OTHER): Payer: Medicare Other | Admitting: Neurosurgery

## 2011-11-07 VITALS — BP 137/66 | HR 51 | Temp 98.0°F | Ht 74.0 in | Wt 212.4 lb

## 2011-11-07 DIAGNOSIS — Z48812 Encounter for surgical aftercare following surgery on the circulatory system: Secondary | ICD-10-CM

## 2011-11-07 DIAGNOSIS — I714 Abdominal aortic aneurysm, without rupture, unspecified: Secondary | ICD-10-CM

## 2011-11-07 NOTE — Progress Notes (Signed)
Addended by: Mena Goes on: 11/07/2011 02:37 PM   Modules accepted: Orders

## 2011-11-07 NOTE — Progress Notes (Signed)
[facility] [place] HISTORY AND PHYSICAL   CC: One year AAA duplex Referring Physician: Fields  History of Present Illness: 78 year old patient of Dr. Oneida Alar that underwent a AAA endograft in April of 2007. Patient reports no problems with abdominal pain or back pain and no signs of claudication with ambulation.  Past Medical History  Diagnosis Date  . Hyperlipidemia   . Diabetes mellitus   . Hypertension   . Coronary artery disease     s/p PTA and stenting  . Myocardial infarction   . Benign prostatic hypertrophy     history of post catheterization  . Nephrolithiasis     history of cystoscopy  . Kidney stone     ROS: [x]  Positive   [ ]  Denies    General: [ ]  Weight loss, [ ]  Fever, [ ]  chills Neurologic: [ ]  Dizziness, [ ]  Blackouts, [ ]  Seizure [ ]  Stroke, [ ]  "Mini stroke", [ ]  Slurred speech, [ ]  Temporary blindness; [ ]  weakness in arms or legs, [ ]  Hoarseness Cardiac: [ ]  Chest pain/pressure, [ ]  Shortness of breath at rest [ ]  Shortness of breath with exertion, [ ]  Atrial fibrillation or irregular heartbeat Vascular: [ ]  Pain in legs with walking, [ ]  Pain in legs at rest, [ ]  Pain in legs at night,  [ ]  Non-healing ulcer, [ ]  Blood clot in vein/DVT,   Pulmonary: [ ]  Home oxygen, [ ]  Productive cough, [ ]  Coughing up blood, [ ]  Asthma,  [ ]  Wheezing Musculoskeletal:  [ ]  Arthritis, [ ]  Low back pain, [ ]  Joint pain Hematologic: [ ]  Easy Bruising, [ ]  Anemia; [ ]  Hepatitis Gastrointestinal: [ ]  Blood in stool, [ ]  Gastroesophageal Reflux/heartburn, [ ]  Trouble swallowing Urinary: [ ]  chronic Kidney disease, [ ]  on HD - [ ]  MWF or [ ]  TTHS, [ ]  Burning with urination, [ ]  Difficulty urinating Skin: [ ]  Rashes, [ ]  Wounds Psychological: [ ]  Anxiety, [ ]  Depression   Social History History  Substance Use Topics  . Smoking status: Former Smoker    Quit date: 07/08/1992  . Smokeless tobacco: Not on file  . Alcohol Use:     Family  History Family History  Problem Relation Age of Onset  . Diabetes Mother   . Heart disease Mother   . Heart attack Mother 47    mother passed as a result  . Diabetes Daughter   . Diabetes Sister     No Known Allergies  Current Outpatient Prescriptions  Medication Sig Dispense Refill  . aspirin 81 MG tablet Take 81 mg by mouth daily.      [name] atorvastatin (LIPITOR) 10 MG tablet Take 10 mg by mouth daily.      . fenofibrate (TRICOR) 145 MG tablet Take 145 mg by mouth daily.      . fenofibrate micronized (LOFIBRA) 134 MG capsule Take 134 mg by mouth daily.      . metFORMIN (GLUCOPHAGE-XR) 500 MG 24 hr tablet Take 500 mg by mouth 2 (two) times daily. 2 pills bid      . pioglitazone (ACTOS) 45 MG tablet Take 45 mg by mouth daily.      . pioglitazone-metformin (ACTOPLUS MET) 15-500 MG per tablet Take 1 tablet by mouth 2 (two) times daily.        Physical Examination  Filed Vitals:   11/07/11 0943  BP: 137/66  Pulse: 51  Temp: 98 F (36.7 C)    Body  mass index is 27.27 kg/(m^2).  General:  WDWN in NAD Gait: Normal HEENT: WNL Eyes: Pupils equal Pulmonary: normal non-labored breathing , without Rales, rhonchi,  wheezing Cardiac: RRR, without  Murmurs, rubs or gallops; No carotid bruits Abdomen: soft, NT, no masses Skin: no rashes, ulcers noted Vascular Exam/Pulses: Patient has palpable femoral pulses bilaterally no palpable abdominal mass  Extremities without ischemic changes, no Gangrene , no cellulitis; no open wounds;  Musculoskeletal: no muscle wasting or atrophy  Neurologic: A&O X 3; Appropriate Affect ; SENSATION: normal; MOTOR FUNCTION:  moving all extremities equally. Speech is fluent/normal  Non-Invasive Vascular Imaging: Vascular duplex of AAA today shows a sac sestamibi 3.2 AP, 3.3 transverse which is a slight decrease from last year  ASSESSMENT/PLAN: Patient now 6 years status post AAA endograft doing well, he will return in one year for repeat AAA duplex and be  seen in followup his questions were encouraged and answered.  Beatris Ship ANP  Clinic M.D.: Fields

## 2011-11-15 NOTE — Procedures (Unsigned)
VASCULAR LAB EXAM  INDICATION:  Follow up AAA endograft placed 10/23/2005.  HISTORY: Diabetes:  Yes. Cardiac:  Yes. Hypertension:  No.  EXAM:  AAA sac size 3.2 cm AP, 3.3 cm transverse.  Previous sac size date 11/02/2010 3.3 cm AP, 3.9 cm transverse.  IMPRESSION: 1. The aorta and endograft are patent. 2. Aneurysmal sac cannot be appreciated and appears to have fully     shrunk around the stent graft 3. No evidence of endoleak was detected.  ___________________________________________ Jessy Oto. Fields, MD  LT/MEDQ  D:  11/07/2011  T:  11/07/2011  Job:  [id number]

## 2012-01-16 ENCOUNTER — Other Ambulatory Visit: Payer: Self-pay | Admitting: Dermatology

## 2012-11-11 ENCOUNTER — Encounter: Payer: Self-pay | Admitting: Vascular Surgery

## 2012-11-12 ENCOUNTER — Encounter (INDEPENDENT_AMBULATORY_CARE_PROVIDER_SITE_OTHER): Payer: Medicare Other | Admitting: *Deleted

## 2012-11-12 ENCOUNTER — Ambulatory Visit: Payer: Medicare Other | Admitting: Vascular Surgery

## 2012-11-12 DIAGNOSIS — Z48812 Encounter for surgical aftercare following surgery on the circulatory system: Secondary | ICD-10-CM

## 2012-11-12 DIAGNOSIS — I714 Abdominal aortic aneurysm, without rupture: Secondary | ICD-10-CM

## 2013-07-12 ENCOUNTER — Encounter: Payer: Self-pay | Admitting: Internal Medicine

## 2013-07-13 ENCOUNTER — Encounter: Payer: Self-pay | Admitting: Emergency Medicine

## 2013-07-13 ENCOUNTER — Ambulatory Visit (INDEPENDENT_AMBULATORY_CARE_PROVIDER_SITE_OTHER): Payer: Medicare Other | Admitting: Emergency Medicine

## 2013-07-13 VITALS — BP 104/62 | HR 58 | Temp 98.4°F | Resp 16 | Ht 73.5 in | Wt 208.0 lb

## 2013-07-13 DIAGNOSIS — E785 Hyperlipidemia, unspecified: Secondary | ICD-10-CM

## 2013-07-13 DIAGNOSIS — J449 Chronic obstructive pulmonary disease, unspecified: Secondary | ICD-10-CM | POA: Insufficient documentation

## 2013-07-13 DIAGNOSIS — I2510 Atherosclerotic heart disease of native coronary artery without angina pectoris: Secondary | ICD-10-CM

## 2013-07-13 DIAGNOSIS — E1169 Type 2 diabetes mellitus with other specified complication: Secondary | ICD-10-CM | POA: Insufficient documentation

## 2013-07-13 DIAGNOSIS — E559 Vitamin D deficiency, unspecified: Secondary | ICD-10-CM | POA: Insufficient documentation

## 2013-07-13 DIAGNOSIS — E782 Mixed hyperlipidemia: Secondary | ICD-10-CM

## 2013-07-13 DIAGNOSIS — I10 Essential (primary) hypertension: Secondary | ICD-10-CM

## 2013-07-13 DIAGNOSIS — E119 Type 2 diabetes mellitus without complications: Secondary | ICD-10-CM

## 2013-07-13 LAB — CBC WITH DIFFERENTIAL/PLATELET
Basophils Absolute: 0.1 10*3/uL (ref 0.0–0.1)
Basophils Relative: 1 % (ref 0–1)
Eosinophils Absolute: 0.1 10*3/uL (ref 0.0–0.7)
Eosinophils Relative: 2 % (ref 0–5)
HEMATOCRIT: 42.2 % (ref 39.0–52.0)
HEMOGLOBIN: 14.6 g/dL (ref 13.0–17.0)
LYMPHS ABS: 3.5 10*3/uL (ref 0.7–4.0)
LYMPHS PCT: 45 % (ref 12–46)
MCH: 30.5 pg (ref 26.0–34.0)
MCHC: 34.6 g/dL (ref 30.0–36.0)
MCV: 88.3 fL (ref 78.0–100.0)
MONO ABS: 0.7 10*3/uL (ref 0.1–1.0)
MONOS PCT: 9 % (ref 3–12)
NEUTROS ABS: 3.5 10*3/uL (ref 1.7–7.7)
Neutrophils Relative %: 43 % (ref 43–77)
Platelets: 266 10*3/uL (ref 150–400)
RBC: 4.78 MIL/uL (ref 4.22–5.81)
RDW: 13.6 % (ref 11.5–15.5)
WBC: 7.9 10*3/uL (ref 4.0–10.5)

## 2013-07-13 LAB — LIPID PANEL
CHOL/HDL RATIO: 3.3 ratio
CHOLESTEROL: 159 mg/dL (ref 0–200)
HDL: 48 mg/dL (ref >39)
LDL Cholesterol: 70 mg/dL (ref 0–99)
Triglycerides: 205 mg/dL — ABNORMAL HIGH (ref <150)
VLDL: 41 mg/dL — ABNORMAL HIGH (ref 0–40)

## 2013-07-13 LAB — HEPATIC FUNCTION PANEL
ALBUMIN: 4.1 g/dL (ref 3.5–5.2)
ALT: 15 U/L (ref 0–53)
AST: 18 U/L (ref 0–37)
Alkaline Phosphatase: 78 U/L (ref 39–117)
BILIRUBIN TOTAL: 0.6 mg/dL (ref 0.3–1.2)
Bilirubin, Direct: 0.1 mg/dL (ref 0.0–0.3)
Indirect Bilirubin: 0.5 mg/dL (ref 0.0–0.9)
Total Protein: 6.5 g/dL (ref 6.0–8.3)

## 2013-07-13 LAB — BASIC METABOLIC PANEL WITH GFR
BUN: 11 mg/dL (ref 6–23)
CO2: 28 mEq/L (ref 19–32)
Calcium: 9 mg/dL (ref 8.4–10.5)
Chloride: 97 mEq/L (ref 96–112)
Creat: 1.08 mg/dL (ref 0.50–1.35)
GFR, EST AFRICAN AMERICAN: 76 mL/min
GFR, Est Non African American: 66 mL/min
GLUCOSE: 176 mg/dL — AB (ref 70–99)
POTASSIUM: 4 meq/L (ref 3.5–5.3)
Sodium: 131 mEq/L — ABNORMAL LOW (ref 135–145)

## 2013-07-13 LAB — HEMOGLOBIN A1C
Hgb A1c MFr Bld: 8.4 % — ABNORMAL HIGH (ref <5.7)
MEAN PLASMA GLUCOSE: 194 mg/dL — AB (ref <117)

## 2013-07-13 LAB — MAGNESIUM: Magnesium: 1.8 mg/dL (ref 1.5–2.5)

## 2013-07-13 NOTE — Patient Instructions (Signed)

## 2013-07-13 NOTE — Progress Notes (Signed)
   Subjective:    Patient ID: Isaiah Jackson, male    DOB: [birth year], 80 y.o.   MRN: [id number]  HPI Comments: 80 yo male presents for 3 month F/U for HTN, Cholesterol, DM, D. Deficient. He is eating descent except for sweets and breads. He is not exercising routinely. LAST LABS T 164 TG 175 H 50 L 79 A1C 8.2 MAG 1.8 D 47 BS at home 140s. HE IS  Not checking BP since has been off RX.  He would like samples of viagra for ED. He notes it works well without any effect on BP or adverse SE.  Hyperlipidemia  Diabetes   Current Outpatient Prescriptions on File Prior to Visit  Medication Sig Dispense Refill  . aspirin 81 MG tablet Take 81 mg by mouth daily.      . Cholecalciferol (VITAMIN D-3) 1000 UNITS CAPS Take 1,000 Units by mouth daily.      . fenofibrate micronized (LOFIBRA) 134 MG capsule Take 134 mg by mouth daily. Tues, Thurs, Sat, Sun      . Magnesium 400 MG CAPS Take 400 mg by mouth daily.       No current facility-administered medications on file prior to visit.   ALLERGIES Capoten and Lamisil  Past Medical History  Diagnosis Date  . Hyperlipidemia   . Diabetes mellitus   . Hypertension   . Coronary artery disease     s/p PTA and stenting  . Myocardial infarction   . Benign prostatic hypertrophy     history of post catheterization  . Nephrolithiasis     history of cystoscopy  . Kidney stone   . COPD (chronic obstructive pulmonary disease)   . Vitamin D deficiency        Review of Systems  Genitourinary:       ED +  All other systems reviewed and are negative.   BP 104/62  Pulse 58  Temp(Src) 98.4 F (36.9 C) (Temporal)  Resp 16  Ht 6' 1.5" (1.867 m)  Wt 208 lb (94.348 kg)  BMI 27.07 kg/m2     Objective:   Physical Exam  Nursing note and vitals reviewed. Constitutional: He is oriented to person, place, and time. He appears well-developed and well-nourished.  HENT:  Head: Normocephalic and atraumatic.  Right Ear: External ear normal.  Left Ear:  External ear normal.  Nose: Nose normal.  Eyes: Conjunctivae and EOM are normal.  Neck: Normal range of motion. Neck supple. No JVD present. No thyromegaly present.  Cardiovascular: Normal rate, regular rhythm, normal heart sounds and intact distal pulses.   Pulmonary/Chest: Effort normal and breath sounds normal.  Abdominal: Soft. Bowel sounds are normal. He exhibits no distension and no mass. There is no tenderness. There is no rebound and no guarding.  Musculoskeletal: Normal range of motion. He exhibits no edema and no tenderness.  Lymphadenopathy:    He has no cervical adenopathy.  Neurological: He is alert and oriented to person, place, and time. He has normal reflexes. No cranial nerve deficit. Coordination normal.  Skin: Skin is warm and dry.  Psychiatric: He has a normal mood and affect. His behavior is normal. Judgment and thought content normal.          Assessment & Plan:  1.  3 month F/U for HTN, Cholesterol,DM, D. Deficient. Needs healthy diet, cardio QD and obtain healthy weight. Check Labs, Check BP if >130/80 call office, Check BS if >200 call office 2. ED-SX Viagra 50 mg #3 AD

## 2013-07-14 LAB — INSULIN, FASTING: Insulin fasting, serum: 22 u[IU]/mL (ref 3–28)

## 2013-08-26 ENCOUNTER — Other Ambulatory Visit: Payer: Self-pay | Admitting: *Deleted

## 2013-08-26 MED ORDER — FENOFIBRATE MICRONIZED 134 MG PO CAPS: 134.0000 mg | ORAL_CAPSULE | Freq: Every day | ORAL | Status: DC

## 2013-08-31 ENCOUNTER — Ambulatory Visit: Payer: Self-pay

## 2013-09-07 ENCOUNTER — Ambulatory Visit (INDEPENDENT_AMBULATORY_CARE_PROVIDER_SITE_OTHER): Payer: Medicare Other | Admitting: *Deleted

## 2013-09-07 DIAGNOSIS — R6889 Other general symptoms and signs: Secondary | ICD-10-CM

## 2013-09-07 LAB — BASIC METABOLIC PANEL
BUN: 15 mg/dL (ref 6–23)
CHLORIDE: 98 meq/L (ref 96–112)
CO2: 25 meq/L (ref 19–32)
Calcium: 9.4 mg/dL (ref 8.4–10.5)
Creat: 1.12 mg/dL (ref 0.50–1.35)
GLUCOSE: 299 mg/dL — AB (ref 70–99)
POTASSIUM: 4.8 meq/L (ref 3.5–5.3)
SODIUM: 133 meq/L — AB (ref 135–145)

## 2013-09-09 ENCOUNTER — Other Ambulatory Visit: Payer: Self-pay | Admitting: *Deleted

## 2013-09-09 ENCOUNTER — Other Ambulatory Visit: Payer: Self-pay | Admitting: Emergency Medicine

## 2013-09-09 MED ORDER — CANAGLIFLOZIN 100 MG PO TABS: ORAL_TABLET | ORAL | Status: DC

## 2013-10-13 ENCOUNTER — Ambulatory Visit (INDEPENDENT_AMBULATORY_CARE_PROVIDER_SITE_OTHER): Payer: Medicare Other | Admitting: Internal Medicine

## 2013-10-13 ENCOUNTER — Encounter: Payer: Self-pay | Admitting: Internal Medicine

## 2013-10-13 VITALS — BP 134/66 | HR 60 | Temp 98.1°F | Resp 16 | Ht 73.0 in | Wt 205.4 lb

## 2013-10-13 DIAGNOSIS — E559 Vitamin D deficiency, unspecified: Secondary | ICD-10-CM

## 2013-10-13 DIAGNOSIS — I10 Essential (primary) hypertension: Secondary | ICD-10-CM

## 2013-10-13 DIAGNOSIS — E1140 Type 2 diabetes mellitus with diabetic neuropathy, unspecified: Secondary | ICD-10-CM | POA: Insufficient documentation

## 2013-10-13 DIAGNOSIS — Z79899 Other long term (current) drug therapy: Secondary | ICD-10-CM

## 2013-10-13 DIAGNOSIS — Z125 Encounter for screening for malignant neoplasm of prostate: Secondary | ICD-10-CM

## 2013-10-13 DIAGNOSIS — I714 Abdominal aortic aneurysm, without rupture, unspecified: Secondary | ICD-10-CM

## 2013-10-13 DIAGNOSIS — Z1331 Encounter for screening for depression: Secondary | ICD-10-CM

## 2013-10-13 DIAGNOSIS — Z1212 Encounter for screening for malignant neoplasm of rectum: Secondary | ICD-10-CM

## 2013-10-13 DIAGNOSIS — E1129 Type 2 diabetes mellitus with other diabetic kidney complication: Secondary | ICD-10-CM

## 2013-10-13 DIAGNOSIS — E785 Hyperlipidemia, unspecified: Secondary | ICD-10-CM

## 2013-10-13 DIAGNOSIS — Z0000 Encounter for general adult medical examination without abnormal findings: Secondary | ICD-10-CM

## 2013-10-13 DIAGNOSIS — Z789 Other specified health status: Secondary | ICD-10-CM

## 2013-10-13 LAB — CBC WITH DIFFERENTIAL/PLATELET
BASOS ABS: 0.1 10*3/uL (ref 0.0–0.1)
Basophils Relative: 1 % (ref 0–1)
EOS ABS: 0.1 10*3/uL (ref 0.0–0.7)
EOS PCT: 1 % (ref 0–5)
HEMATOCRIT: 39.7 % (ref 39.0–52.0)
Hemoglobin: 13.6 g/dL (ref 13.0–17.0)
LYMPHS ABS: 3.4 10*3/uL (ref 0.7–4.0)
Lymphocytes Relative: 46 % (ref 12–46)
MCH: 29.2 pg (ref 26.0–34.0)
MCHC: 34.3 g/dL (ref 30.0–36.0)
MCV: 85.2 fL (ref 78.0–100.0)
MONO ABS: 0.7 10*3/uL (ref 0.1–1.0)
Monocytes Relative: 9 % (ref 3–12)
Neutro Abs: 3.1 10*3/uL (ref 1.7–7.7)
Neutrophils Relative %: 43 % (ref 43–77)
PLATELETS: 265 10*3/uL (ref 150–400)
RBC: 4.66 MIL/uL (ref 4.22–5.81)
RDW: 14.2 % (ref 11.5–15.5)
WBC: 7.3 10*3/uL (ref 4.0–10.5)

## 2013-10-13 LAB — MAGNESIUM: Magnesium: 1.9 mg/dL (ref 1.5–2.5)

## 2013-10-13 LAB — BASIC METABOLIC PANEL WITH GFR
BUN: 17 mg/dL (ref 6–23)
CHLORIDE: 98 meq/L (ref 96–112)
CO2: 26 mEq/L (ref 19–32)
Calcium: 10 mg/dL (ref 8.4–10.5)
Creat: 1.07 mg/dL (ref 0.50–1.35)
GFR, Est African American: 77 mL/min
GFR, Est Non African American: 67 mL/min
Glucose, Bld: 141 mg/dL — ABNORMAL HIGH (ref 70–99)
POTASSIUM: 4.2 meq/L (ref 3.5–5.3)
SODIUM: 132 meq/L — AB (ref 135–145)

## 2013-10-13 LAB — HEPATIC FUNCTION PANEL
ALK PHOS: 64 U/L (ref 39–117)
ALT: 15 U/L (ref 0–53)
AST: 19 U/L (ref 0–37)
Albumin: 4 g/dL (ref 3.5–5.2)
BILIRUBIN TOTAL: 0.6 mg/dL (ref 0.2–1.2)
Bilirubin, Direct: 0.1 mg/dL (ref 0.0–0.3)
Indirect Bilirubin: 0.5 mg/dL (ref 0.2–1.2)
Total Protein: 6.4 g/dL (ref 6.0–8.3)

## 2013-10-13 LAB — LIPID PANEL
Cholesterol: 143 mg/dL (ref 0–200)
HDL: 46 mg/dL (ref >39)
LDL CALC: 68 mg/dL (ref 0–99)
TRIGLYCERIDES: 146 mg/dL (ref <150)
Total CHOL/HDL Ratio: 3.1 Ratio
VLDL: 29 mg/dL (ref 0–40)

## 2013-10-13 LAB — TSH: TSH: 2.224 u[IU]/mL (ref 0.350–4.500)

## 2013-10-13 LAB — HEMOGLOBIN A1C
Hgb A1c MFr Bld: 7.7 % — ABNORMAL HIGH (ref <5.7)
Mean Plasma Glucose: 174 mg/dL — ABNORMAL HIGH (ref <117)

## 2013-10-13 MED ORDER — CANAGLIFLOZIN 300 MG PO TABS: ORAL_TABLET | ORAL | Status: DC

## 2013-10-13 NOTE — Patient Instructions (Signed)

## 2013-10-13 NOTE — Progress Notes (Signed)
Patient ID: Isaiah Jackson, male   DOB: 1934-02-21, 80 y.o.   MRN: [id number]   Annual Screening Comprehensive Examination  This very nice 80 y.o.  WWM presents for complete physical.  Patient has been followed for HTN, ASHD, T2 NIDDM w/ Stage II CKD, Hyperlipidemia, and Vitamin D Deficiency.   Patient has long Hx/o labile HTN. Today's BP is 134/66 mmHg.  Patient had an acute MI in 1994 and PTCA/Stents in 1996 with a negative Cardiolyte in 2006. Patient denies any cardiac symptoms as chest pain, palpitations, shortness of breath, dizziness or ankle swelling.   Patient's hyperlipidemia is controlled with diet and medications. Patient denies myalgias or other medication SE's. Last cholesterol last visit was 159, triglycerides 205, HDL 48 and LDL 70 in Jan 2015 - all at goal.     Patient has T2 NIDDM since 2003 w/Stage II CKD (GFR 66 ml/min) with last A1c 8.2% in Sept 2014. Patient denies reactive hypoglycemic symptoms, visual blurring, diabetic polys, or paresthesias.    Finally, patient has history of Vitamin D Deficiency of 33 in 2012with last vitamin D 47 in Sept 2014.  Medication Sig  . aspirin 81 MG tablet Take 81 mg by mouth daily.  . Canagliflozin (INVOKANA) 300 MG TABS Taking 1/2 tab (150 mg) daily  . VITAMIN D-3 1000 UNITS CAPS Take 1,000 Units by mouth daily.  . fenofibrate micronized  134 MG capsule Take 1 capsule (134 mg total) by mouth daily. Tues, Thurs, Sat, Sun  . Magnesium 400 MG CAPS Take 400 mg by mouth daily.  . metFORMIN (GLUCOPHAGE) 500 MG tablet Take 500 mg by mouth 3 (three) times daily.  . simvastatin (ZOCOR) 40 MG tablet Take 40 mg by mouth daily. M, W, F   Allergies  Allergen Reactions  . Capoten [Captopril]     Fatigue/depression  . Lamisil [Terbinafine Hcl] Rash    Past Medical History  Diagnosis Date  . Hyperlipidemia   . Diabetes mellitus   . Hypertension   . Coronary artery disease     s/p PTA and stenting  . Myocardial infarction   . Benign  prostatic hypertrophy     history of post catheterization  . Nephrolithiasis     history of cystoscopy  . Kidney stone   . COPD (chronic obstructive pulmonary disease)   . Vitamin D deficiency     Past Surgical History  Procedure Laterality Date  . Abdominal aortic aneurysm repair  10/23/2005    endograft  . Cytoscopy      for kidney stones  . Shoulder surgery Right     Family History  Problem Relation Age of Onset  . Diabetes Mother   . Heart disease Mother   . Heart attack Mother 7    mother passed as a result  . Diabetes Daughter   . Diabetes Sister     History   Social History  . Marital Status: Widowed Nov 2012    Spouse Name: N/A    Number of Children: N/A  . Years of Education: N/A   Occupational History  . Ret electrician 2002   Social History Main Topics  . Smoking status: Former Smoker    Quit date: 07/08/1992  . Smokeless tobacco: No  . Alcohol Use: No  . Drug Use: No  . Sexual Activity: No     ROS Constitutional: Denies fever, chills, weight loss/gain, headaches, insomnia, fatigue, night sweats, and change in appetite. Eyes: Denies redness, blurred vision, diplopia, discharge, itchy, watery eyes.  ENT: Denies discharge, congestion, post nasal drip, epistaxis, sore throat, earache, hearing loss, dental pain, Tinnitus, Vertigo, Sinus pain, snoring.  Cardio: Denies chest pain, palpitations, irregular heartbeat, syncope, dyspnea, diaphoresis, orthopnea, PND, claudication, edema Respiratory: denies cough, dyspnea, DOE, pleurisy, hoarseness, laryngitis, wheezing.  Gastrointestinal: Denies dysphagia, heartburn, reflux, water brash, pain, cramps, nausea, vomiting, bloating, diarrhea, constipation, hematemesis, melena, hematochezia, jaundice, hemorrhoids Genitourinary: Denies dysuria, frequency, urgency, nocturia, hesitancy, discharge, hematuria, flank pain Musculoskeletal: Denies arthralgia, myalgia, stiffness, Jt. Swelling, pain, limp, and  strain/sprain. Skin: Denies puritis, rash, hives, warts, acne, eczema, changing in skin lesion Neuro: No weakness, tremor, incoordination, spasms, paresthesia, pain Psychiatric: Denies confusion, memory loss, sensory loss Endocrine: Denies change in weight, skin, hair change, nocturia, and paresthesia, diabetic polys, visual blurring, hyper / hypo glycemic episodes.  Heme/Lymph: No excessive bleeding, bruising, or elarged lymph nodes.  Physical Exam    BP 134/66  Pulse 60  Temp 98.1 F   Resp 16  Ht _0    Wt 205 lb 6.4 oz  BMI 27.11 kg/m2  General Appearance: Well nourished, in no apparent distress. Eyes: PERRLA, EOMs, conjunctiva no swelling or erythema, normal fundi and vessels. Sinuses: No frontal/maxillary tenderness ENT/Mouth: EACs patent / TMs  nl. Nares clear without erythema, swelling, mucoid exudates. Oral hygiene is good. No erythema, swelling, or exudate. Tongue normal, non-obstructing. Tonsils not swollen or erythematous. Hearing normal.  Neck: Supple, thyroid normal. No bruits, nodes or JVD. Respiratory: Respiratory effort normal.  BS equal and clear bilateral without rales, rhonci, wheezing or stridor. Cardio: Heart sounds are normal with regular rate and rhythm and no murmurs, rubs or gallops. Peripheral pulses are normal and equal bilaterally without edema. No aortic or femoral bruits. Chest: symmetric with normal excursions and percussion.  Abdomen: Flat, soft, with bowl sounds. Nontender, no guarding, rebound, hernias, masses, or organomegaly.  Lymphatics: Non tender without lymphadenopathy.  Genitourinary: No hernias.Testes nl. DRE - prostate nl for age - smooth & firm w/o nodules. Musculoskeletal: Full ROM all peripheral extremities, joint stability, 5/5 strength, and normal gait. Skin: Warm and dry without rashes, lesions, cyanosis, clubbing or  ecchymosis.  Neuro: Cranial nerves intact, reflexes equal bilaterally. Normal muscle tone, no cerebellar symptoms.  Sensation intact.  Pysch: Awake and oriented X 3, normal affect, insight and judgment appropriate.   Assessment and Plan  1. Annual Screening Examination 2. Hypertension  3. Hyperlipidemia 4. T2 NIDDM w/Stage II CKD 5. Vitamin D Deficiency 6. ASCAD/ ZO(1096), PTCA/Stent (1996)  Continue prudent diet as discussed, weight control, BP monitoring, regular exercise, and medications as discussed.  Discussed med effects and SE's. Routine screening labs and tests as requested with regular follow-up as recommended.

## 2013-10-14 LAB — INSULIN, FASTING: INSULIN FASTING, SERUM: 30 u[IU]/mL — AB (ref 3–28)

## 2013-10-14 LAB — MICROALBUMIN / CREATININE URINE RATIO
CREATININE, URINE: 36.2 mg/dL
MICROALB UR: 0.69 mg/dL (ref 0.00–1.89)
Microalb Creat Ratio: 19.1 mg/g (ref 0.0–30.0)

## 2013-10-14 LAB — URINALYSIS, MICROSCOPIC ONLY
Bacteria, UA: NONE SEEN
Casts: NONE SEEN
Crystals: NONE SEEN
Squamous Epithelial / LPF: NONE SEEN

## 2013-10-14 LAB — VITAMIN D 25 HYDROXY (VIT D DEFICIENCY, FRACTURES): Vit D, 25-Hydroxy: 34 ng/mL (ref 30–89)

## 2013-10-14 LAB — PSA: PSA: 0.17 ng/mL (ref <=4.00)

## 2014-01-17 ENCOUNTER — Encounter: Payer: Self-pay | Admitting: Physician Assistant

## 2014-01-17 ENCOUNTER — Ambulatory Visit (INDEPENDENT_AMBULATORY_CARE_PROVIDER_SITE_OTHER): Payer: Medicare Other | Admitting: Physician Assistant

## 2014-01-17 VITALS — BP 120/60 | HR 56 | Temp 97.9°F | Resp 16 | Wt 208.0 lb

## 2014-01-17 DIAGNOSIS — J42 Unspecified chronic bronchitis: Secondary | ICD-10-CM

## 2014-01-17 DIAGNOSIS — K1370 Unspecified lesions of oral mucosa: Secondary | ICD-10-CM

## 2014-01-17 DIAGNOSIS — Z789 Other specified health status: Secondary | ICD-10-CM

## 2014-01-17 DIAGNOSIS — E785 Hyperlipidemia, unspecified: Secondary | ICD-10-CM

## 2014-01-17 DIAGNOSIS — E1129 Type 2 diabetes mellitus with other diabetic kidney complication: Secondary | ICD-10-CM

## 2014-01-17 DIAGNOSIS — Z0000 Encounter for general adult medical examination without abnormal findings: Secondary | ICD-10-CM

## 2014-01-17 DIAGNOSIS — I10 Essential (primary) hypertension: Secondary | ICD-10-CM

## 2014-01-17 DIAGNOSIS — R590 Localized enlarged lymph nodes: Secondary | ICD-10-CM

## 2014-01-17 DIAGNOSIS — Z1331 Encounter for screening for depression: Secondary | ICD-10-CM

## 2014-01-17 DIAGNOSIS — Z79899 Other long term (current) drug therapy: Secondary | ICD-10-CM

## 2014-01-17 DIAGNOSIS — E559 Vitamin D deficiency, unspecified: Secondary | ICD-10-CM

## 2014-01-17 LAB — LIPID PANEL
CHOL/HDL RATIO: 2.8 ratio
CHOLESTEROL: 145 mg/dL (ref 0–200)
HDL: 52 mg/dL (ref >39)
LDL Cholesterol: 64 mg/dL (ref 0–99)
Triglycerides: 143 mg/dL (ref <150)
VLDL: 29 mg/dL (ref 0–40)

## 2014-01-17 LAB — CBC WITH DIFFERENTIAL/PLATELET
Basophils Absolute: 0.1 10*3/uL (ref 0.0–0.1)
Basophils Relative: 1 % (ref 0–1)
Eosinophils Absolute: 0.1 10*3/uL (ref 0.0–0.7)
Eosinophils Relative: 1 % (ref 0–5)
HCT: 39.2 % (ref 39.0–52.0)
HEMOGLOBIN: 13.2 g/dL (ref 13.0–17.0)
LYMPHS ABS: 2.6 10*3/uL (ref 0.7–4.0)
LYMPHS PCT: 44 % (ref 12–46)
MCH: 27.8 pg (ref 26.0–34.0)
MCHC: 33.7 g/dL (ref 30.0–36.0)
MCV: 82.5 fL (ref 78.0–100.0)
MONOS PCT: 9 % (ref 3–12)
Monocytes Absolute: 0.5 10*3/uL (ref 0.1–1.0)
NEUTROS ABS: 2.7 10*3/uL (ref 1.7–7.7)
NEUTROS PCT: 45 % (ref 43–77)
Platelets: 252 10*3/uL (ref 150–400)
RBC: 4.75 MIL/uL (ref 4.22–5.81)
RDW: 14.1 % (ref 11.5–15.5)
WBC: 5.9 10*3/uL (ref 4.0–10.5)

## 2014-01-17 LAB — TSH: TSH: 1.82 u[IU]/mL (ref 0.350–4.500)

## 2014-01-17 LAB — HEPATIC FUNCTION PANEL
ALBUMIN: 3.9 g/dL (ref 3.5–5.2)
ALT: 16 U/L (ref 0–53)
AST: 16 U/L (ref 0–37)
Alkaline Phosphatase: 57 U/L (ref 39–117)
BILIRUBIN TOTAL: 0.5 mg/dL (ref 0.2–1.2)
Bilirubin, Direct: 0.1 mg/dL (ref 0.0–0.3)
Indirect Bilirubin: 0.4 mg/dL (ref 0.2–1.2)
Total Protein: 6.5 g/dL (ref 6.0–8.3)

## 2014-01-17 LAB — BASIC METABOLIC PANEL WITH GFR
BUN: 16 mg/dL (ref 6–23)
CHLORIDE: 100 meq/L (ref 96–112)
CO2: 26 meq/L (ref 19–32)
Calcium: 9.3 mg/dL (ref 8.4–10.5)
Creat: 0.95 mg/dL (ref 0.50–1.35)
GFR, Est African American: 88 mL/min
GFR, Est Non African American: 76 mL/min
GLUCOSE: 217 mg/dL — AB (ref 70–99)
POTASSIUM: 4.3 meq/L (ref 3.5–5.3)
SODIUM: 133 meq/L — AB (ref 135–145)

## 2014-01-17 LAB — HEMOGLOBIN A1C
HEMOGLOBIN A1C: 7.3 % — AB (ref <5.7)
MEAN PLASMA GLUCOSE: 163 mg/dL — AB (ref <117)

## 2014-01-17 LAB — VITAMIN D 25 HYDROXY (VIT D DEFICIENCY, FRACTURES): Vit D, 25-Hydroxy: 40 ng/mL (ref 30–89)

## 2014-01-17 LAB — MAGNESIUM: MAGNESIUM: 1.7 mg/dL (ref 1.5–2.5)

## 2014-01-17 MED ORDER — METFORMIN HCL 500 MG PO TABS: 500.0000 mg | ORAL_TABLET | Freq: Two times a day (BID) | ORAL | Status: DC

## 2014-01-17 MED ORDER — SIMVASTATIN 40 MG PO TABS: 40.0000 mg | ORAL_TABLET | Freq: Every day | ORAL | Status: DC

## 2014-01-17 MED ORDER — FENOFIBRATE MICRONIZED 134 MG PO CAPS: 134.0000 mg | ORAL_CAPSULE | Freq: Every day | ORAL | Status: DC

## 2014-01-17 NOTE — Patient Instructions (Signed)
Preventative Care for Adults, Male       REGULAR HEALTH EXAMS:  A routine yearly physical is a good way to check in with your primary care provider about your health and preventive screening. It is also an opportunity to share updates about your health and any concerns you have, and receive a thorough all-over exam.   Most health insurance companies pay for at least some preventative services.  Check with your health plan for specific coverages.  WHAT PREVENTATIVE SERVICES DO MEN NEED?  Adult men should have their weight and blood pressure checked regularly.   Men age 35 and older should have their cholesterol levels checked regularly.  Beginning at age 50 and continuing to age 75, men should be screened for colorectal cancer.  Certain people should may need continued testing until age 85.  Other cancer screening may include exams for testicular and prostate cancer.  Updating vaccinations is part of preventative care.  Vaccinations help protect against diseases such as the flu.  Lab tests are generally done as part of preventative care to screen for anemia and blood disorders, to screen for problems with the kidneys and liver, to screen for bladder problems, to check blood sugar, and to check your cholesterol level.  Preventative services generally include counseling about diet, exercise, avoiding tobacco, drugs, excessive alcohol consumption, and sexually transmitted infections.    GENERAL RECOMMENDATIONS FOR GOOD HEALTH:  Healthy diet:  Eat a variety of foods, including fruit, vegetables, animal or vegetable protein, such as meat, fish, chicken, and eggs, or beans, lentils, tofu, and grains, such as rice.  Drink plenty of water daily.  Decrease saturated fat in the diet, avoid lots of red meat, processed foods, sweets, fast foods, and fried foods.  Exercise:  Aerobic exercise helps maintain good heart health. At least 30-40 minutes of moderate-intensity exercise is recommended.  For example, a brisk walk that increases your heart rate and breathing. This should be done on most days of the week.   Find a type of exercise or a variety of exercises that you enjoy so that it becomes a part of your daily life.  Examples are running, walking, swimming, water aerobics, and biking.  For motivation and support, explore group exercise such as aerobic class, spin class, Zumba, Yoga,or  martial arts, etc.    Set exercise goals for yourself, such as a certain weight goal, walk or run in a race such as a 5k walk/run.  Speak to your primary care provider about exercise goals.  Disease prevention:  If you smoke or chew tobacco, find out from your caregiver how to quit. It can literally save your life, no matter how long you have been a tobacco user. If you do not use tobacco, never begin.   Maintain a healthy diet and normal weight. Increased weight leads to problems with blood pressure and diabetes.   The Body Mass Index or BMI is a way of measuring how much of your body is fat. Having a BMI above 27 increases the risk of heart disease, diabetes, hypertension, stroke and other problems related to obesity. Your caregiver can help determine your BMI and based on it develop an exercise and dietary program to help you achieve or maintain this important measurement at a healthful level.  High blood pressure causes heart and blood vessel problems.  Persistent high blood pressure should be treated with medicine if weight loss and exercise do not work.   Fat and cholesterol leaves deposits in your arteries   that can block them. This causes heart disease and vessel disease elsewhere in your body.  If your cholesterol is found to be high, or if you have heart disease or certain other medical conditions, then you may need to have your cholesterol monitored frequently and be treated with medication.   Ask if you should have a stress test if your history suggests this. A stress test is a test done on  a treadmill that looks for heart disease. This test can find disease prior to there being a problem.  Avoid drinking alcohol in excess (more than two drinks per day).  Avoid use of street drugs. Do not share needles with anyone. Ask for professional help if you need assistance or instructions on stopping the use of alcohol, cigarettes, and/or drugs.  Brush your teeth twice a day with fluoride toothpaste, and floss once a day. Good oral hygiene prevents tooth decay and gum disease. The problems can be painful, unattractive, and can cause other health problems. Visit your dentist for a routine oral and dental check up and preventive care every 6-12 months.   Look at your skin regularly.  Use a mirror to look at your back. Notify your caregivers of changes in moles, especially if there are changes in shapes, colors, a size larger than a pencil eraser, an irregular border, or development of new moles.  Safety:  Use seatbelts 100% of the time, whether driving or as a passenger.  Use safety devices such as hearing protection if you work in environments with loud noise or significant background noise.  Use safety glasses when doing any work that could send debris in to the eyes.  Use a helmet if you ride a bike or motorcycle.  Use appropriate safety gear for contact sports.  Talk to your caregiver about gun safety.  Use sunscreen with a SPF (or skin protection factor) of 15 or greater.  Lighter skinned people are at a greater risk of skin cancer. Don't forget to also wear sunglasses in order to protect your eyes from too much damaging sunlight. Damaging sunlight can accelerate cataract formation.   Practice safe sex. Use condoms. Condoms are used for birth control and to help reduce the spread of sexually transmitted infections (or STIs).  Some of the STIs are gonorrhea (the clap), chlamydia, syphilis, trichomonas, herpes, HPV (human papilloma virus) and HIV (human immunodeficiency virus) which causes AIDS.  The herpes, HIV and HPV are viral illnesses that have no cure. These can result in disability, cancer and death.   Keep carbon monoxide and smoke detectors in your home functioning at all times. Change the batteries every 6 months or use a model that plugs into the wall.   Vaccinations:  Stay up to date with your tetanus shots and other required immunizations. You should have a booster for tetanus every 10 years. Be sure to get your flu shot every year, since 5%-20% of the [place] population comes down with the flu. The flu vaccine changes each year, so being vaccinated once is not enough. Get your shot in the fall, before the flu season peaks.   Other vaccines to consider:  Pneumococcal vaccine to protect against certain types of pneumonia.  This is normally recommended for adults age 65 or older.  However, adults younger than 78 years old with certain underlying conditions such as diabetes, heart or lung disease should also receive the vaccine.  Shingles vaccine to protect against Varicella Zoster if you are older than age 60, or younger   than 78 years old with certain underlying illness.  Hepatitis A vaccine to protect against a form of infection of the liver by a virus acquired from food.  Hepatitis B vaccine to protect against a form of infection of the liver by a virus acquired from blood or body fluids, particularly if you work in health care.  If you plan to travel internationally, check with your local health department for specific vaccination recommendations.  Cancer Screening:  Most routine colon cancer screening begins at the age of 50. On a yearly basis, doctors may provide special easy to use take-home tests to check for hidden blood in the stool. Sigmoidoscopy or colonoscopy can detect the earliest forms of colon cancer and is life saving. These tests use a small camera at the end of a tube to directly examine the colon. Speak to your caregiver about this at age 50, when routine  screening begins (and is repeated every 5 years unless early forms of pre-cancerous polyps or small growths are found).   At the age of 50 men usually start screening for prostate cancer every year. Screening may begin at a younger age for those with higher risk. Those at higher risk include African-Americans or having a family history of prostate cancer. There are two types of tests for prostate cancer:   Prostate-specific antigen (PSA) testing. Recent studies raise questions about prostate cancer using PSA and you should discuss this with your caregiver.   Digital rectal exam (in which your doctor's lubricated and gloved finger feels for enlargement of the prostate through the anus).   Screening for testicular cancer.  Do a monthly exam of your testicles. Gently roll each testicle between your thumb and fingers, feeling for any abnormal lumps. The best time to do this is after a hot shower or bath when the tissues are looser. Notify your caregivers of any lumps, tenderness or changes in size or shape immediately.     

## 2014-01-17 NOTE — Progress Notes (Signed)
MEDICARE ANNUAL WELLNESS VISIT AND FOLLOW UP Assessment:   1. Essential hypertension - CBC with Differential - BASIC METABOLIC PANEL WITH GFR - Hepatic function panel - TSH  2. 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 - Hemoglobin A1c  3. Encounter for long-term (current) use of other medications - Magnesium  4. Hyperlipidemia - Lipid panel  5. Vitamin D deficiency - Vit D  25 hydroxy (rtn osteoporosis monitoring)  6. Chronic bronchitis, unspecified chronic bronchitis type stable  7. Lymphadenopathy of right cervical region and white area on right posterior pharynx rule out cancer, tonsil stone, yeast unlikely no pain 50 year pack smoking history +, stopped in 1991.  - Ambulatory referral to ENT rule out cancer  8. Right shoulder pain likely OA, adhesive capsulitis, subacromial bursitis - declines PT, injection at this time -will continue OTC meds and given exercises to do at home.    Plan:   During the course of the visit the patient was educated and counseled about appropriate screening and preventive services including:    Pneumococcal vaccine   Influenza vaccine  Td vaccine  Screening electrocardiogram  Colorectal cancer screening  Diabetes screening  Glaucoma screening  Nutrition counseling   Screening recommendations, referrals: Vaccinations: Tdap vaccine declined Influenza vaccine not indicated Pneumococcal vaccine declined Shingles vaccine declined Hep B vaccine not indicated  Nutrition assessed and recommended  Colonoscopy per patient up to date Recommended yearly ophthalmology/optometry visit for glaucoma screening and checkup Recommended yearly dental visit for hygiene and checkup Advanced directives -  requested  Conditions/risks identified: BMI: Discussed weight loss, diet, and increase physical activity.  Increase physical activity: AHA recommends 150 minutes of physical activity a week.  Medications reviewed Diabetes is not at goal, ACE/ARB therapy: Yes. Urinary Incontinence is not an issue: discussed non pharmacology and pharmacology options.  Fall risk: low- discussed PT, home fall assessment, medications.    Subjective:  Isaiah Jackson is a 80 y.o. male who presents for Medicare Annual Wellness Visit and 3 month follow up for HTN, hyperlipidemia, diabetes, and vitamin D Def.  Date of last medicare wellness visit was is unknown.  His blood pressure has been controlled at home, today their BP is BP: 120/60 mmHg He does workout, walks and dances on the weekends. He denies chest pain, shortness of breath, dizziness.  He is on cholesterol medication and denies myalgias. His cholesterol is at goal. The cholesterol last visit was:   Lab Results  Component Value Date   CHOL 143 10/13/2013   HDL 46 10/13/2013   LDLCALC 68 10/13/2013   TRIG 146 10/13/2013   CHOLHDL 3.1 10/13/2013  He has DMII since 2003 w/Stage II CKD.checks his sugars every morning at home runs 130's.  He has been working on diet and exercise for diabetes, and denies paresthesia of the feet, polydipsia, polyuria and visual disturbances. Last A1C in the office was:  Lab Results  Component Value Date   HGBA1C 7.7* 10/13/2013   Patient is on Vitamin D supplement.   Lab Results  Component Value Date   VD25OH 34 10/13/2013     Has COPD via CXR but denies SOB.   Names of Other Physician/Practitioners you currently use: 1. [facility] here for primary care 2. Dr. Einar Gip, eye doctor, last visit 2 years ago 3. Has false teeth/dentures  Patient Care Team: [name], MD as PCP - General (Internal Medicine) Jacolyn Reedy, MD as Consulting Physician (Cardiology) Elam Dutch, MD  as Consulting Physician (Vascular Surgery) Winfield Cunas., MD as Consulting Physician (Gastroenterology) Simona Huh, MD as Consulting Physician (Dermatology) Dr. Sharol Given, ortho  Medication Review: Current Outpatient Prescriptions on File Prior to Visit  Medication Sig Dispense Refill  . aspirin 81 MG tablet Take 81 mg by mouth daily.      . Canagliflozin (INVOKANA) 300 MG TABS Take 1/2 to 1 tablet daily as directed for Diabetes  30 tablet  99  . Cholecalciferol (VITAMIN D-3) 1000 UNITS CAPS Take 1,000 Units by mouth daily.      . fenofibrate micronized (LOFIBRA) 134 MG capsule Take 1 capsule (134 mg total) by mouth daily. Tues, Thurs, Sat, Sun  90 capsule  1  . Magnesium 400 MG CAPS Take 400 mg by mouth daily.      . metFORMIN (GLUCOPHAGE) 500 MG tablet Take 500 mg by mouth 3 (three) times daily.      . simvastatin (ZOCOR) 40 MG tablet Take 40 mg by mouth daily. M, W, F       No current facility-administered medications on file prior to visit.    Current Problems (verified) Patient Active Problem List   Diagnosis Date Noted  . Encounter for long-term (current) use of other medications 10/13/2013  . 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 Hyperlipidemia   . Hypertension   . Coronary artery disease   . Vitamin D deficiency   . COPD (chronic obstructive pulmonary disease)   . 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 Zostavax  12/21/1995  . Pneumococcal Polysaccharide Vaccine Age 63 And Over  12/20/2000  . Colonoscopy  07/08/2012  . Tetanus/tdap  05/25/2013  . Influenza Vaccine  02/05/2014    Immunization History  Administered Date(s) Administered  . Influenza Whole 06/16/2012, 03/26/2013  . Td 05/26/2003    Preventative care: Last colonoscopy: 2014 per patient, and states every 2 years CXR 2012 Carotid Doppler neg 2013 Stress test 2002   Prior vaccinations: TD or Tdap:  2004  Influenza: 2014 Pneumococcal: 2000 Shingles/Zostavax: declines  History reviewed: allergies, current medications, past family history, past medical history, past social history, past surgical history and problem list   Risk Factors: Tobacco History  Substance Use Topics  . Smoking status: Former Smoker    Quit date: 07/08/1992  . Smokeless tobacco: Not on file  . Alcohol Use: No   He does not smoke.  Patient is a former smoker. Are there smokers in your home (other than you)?  No  Alcohol Current alcohol use: none  Caffeine Current caffeine use: coffee 2-3 /day  Exercise Current exercise: walking and dancing  Nutrition/Diet Current diet: in general, a "healthy" diet    Cardiac risk factors: advanced age (older than 53 for men, 29 for women), diabetes mellitus, dyslipidemia, hypertension, male gender and sedentary lifestyle.  Depression Screen (Note: if answer to either of the following is "Yes", a more complete depression screening is indicated)   Q1: Over the past two weeks, have you felt down, depressed or hopeless? No  Q2: Over the past two weeks, have you felt little interest or pleasure in doing things? No  Have you lost interest or pleasure in daily life? No  Do you often feel hopeless? No  Do you cry easily  over simple problems? No  Activities of Daily Living In your present state of health, do you have any difficulty performing the following activities?:  Driving? No Managing money?  No Feeding yourself? No Getting from bed to chair? No Climbing a flight of stairs? No Preparing food and eating?: No Bathing or showering? No Getting dressed: No Getting to the toilet? No Using the toilet:No Moving around from place to place: No In the past year have you fallen or had a near fall?:No   Are you sexually active?  Yes  Do you have more than one partner?  No  Vision Difficulties: No  Hearing Difficulties: Yes Do you often ask people to speak up or  repeat themselves? Yes Do you experience ringing or noises in your ears? No Do you have difficulty understanding soft or whispered voices? Yes  Cognition  Do you feel that you have a problem with memory?No  Do you often misplace items? No  Do you feel safe at home?  Yes  Advanced directives Does patient have a [place]? Yes Does patient have a Living Will? Yes   Objective:   Blood pressure 120/60, pulse 56, temperature 97.9 F (36.6 C), resp. rate 16, weight 208 lb (94.348 kg). Body mass index is 27.45 kg/(m^2).  General appearance: alert, no distress, WD/WN, male Cognitive Testing  Alert? Yes  Normal Appearance?Yes  Oriented to person? Yes  Place? Yes   Time? Yes  Recall of three objects?  Yes  Can perform simple calculations? Yes  Displays appropriate judgment?Yes  Can read the correct time from a watch face?Yes  HEENT: normocephalic, sclerae anicteric, TMs pearly, nares patent, no discharge or erythema, pharynx normal Oral cavity: Right posterior pharynx with 56mm white unscrapable lesion without erythema Neck: supple, right anterior cervical lymphadenopathy nontender 3-4 cm, no thyromegaly, no masses Heart: RRR, normal S1, S2, no murmurs Lungs: CTA bilaterally, no wheezes, rhonchi, or rales Abdomen: +bs, soft, non tender, non distended, no masses, no hepatomegaly, no splenomegaly Musculoskeletal: Right shoulder with limited passive and active abduction to 100 degrees, + tender subacromial bursa. no swelling, no obvious deformity Extremities: no edema, no cyanosis, no clubbing Pulses: 2+ symmetric, upper and lower extremities, normal cap refill Neurological: alert, oriented x 3, CN2-12 intact, strength normal upper extremities and lower extremities, sensation normal throughout, DTRs 2+ throughout, no cerebellar signs, gait normal Psychiatric: normal affect, behavior normal, pleasant   Medicare Attestation I have personally reviewed: The patient's  medical and social history Their use of alcohol, tobacco or illicit drugs Their current medications and supplements The patient's functional ability including ADLs,fall risks, home safety risks, cognitive, and hearing and visual impairment Diet and physical activities Evidence for depression or mood disorders  The patient's weight, height, BMI, and visual acuity have been recorded in the chart.  I have made referrals, counseling, and provided education to the patient based on review of the above and I have provided the patient with a written personalized care plan for preventive services.     Vicie Mutters, PA-C   01/17/2014

## 2014-01-21 ENCOUNTER — Other Ambulatory Visit: Payer: Self-pay | Admitting: Otolaryngology

## 2014-01-21 ENCOUNTER — Other Ambulatory Visit (HOSPITAL_COMMUNITY)
Admission: RE | Admit: 2014-01-21 | Discharge: 2014-01-21 | Disposition: A | Discharge status: Home / Self Care | Payer: Medicare Other | Source: Ambulatory Visit | Attending: Otolaryngology | Admitting: Otolaryngology

## 2014-01-21 DIAGNOSIS — R599 Enlarged lymph nodes, unspecified: Secondary | ICD-10-CM | POA: Insufficient documentation

## 2014-01-26 ENCOUNTER — Other Ambulatory Visit: Payer: Self-pay | Admitting: Otolaryngology

## 2014-01-26 DIAGNOSIS — R591 Generalized enlarged lymph nodes: Secondary | ICD-10-CM

## 2014-01-26 DIAGNOSIS — R599 Enlarged lymph nodes, unspecified: Secondary | ICD-10-CM

## 2014-01-28 ENCOUNTER — Ambulatory Visit
Admission: RE | Admit: 2014-01-28 | Discharge: 2014-01-28 | Disposition: A | Discharge status: Home / Self Care | Payer: Medicare Other | Source: Ambulatory Visit | Attending: Otolaryngology | Admitting: Otolaryngology

## 2014-01-28 DIAGNOSIS — R599 Enlarged lymph nodes, unspecified: Secondary | ICD-10-CM

## 2014-01-28 DIAGNOSIS — R591 Generalized enlarged lymph nodes: Secondary | ICD-10-CM

## 2014-01-28 MED ORDER — IOHEXOL 300 MG/ML  SOLN
75.0000 mL | Freq: Once | INTRAMUSCULAR | Status: AC | PRN
  Administered 2014-01-28: 75 mL via INTRAVENOUS

## 2014-04-19 ENCOUNTER — Ambulatory Visit (INDEPENDENT_AMBULATORY_CARE_PROVIDER_SITE_OTHER): Payer: Medicare Other | Admitting: Internal Medicine

## 2014-04-19 ENCOUNTER — Encounter: Payer: Self-pay | Admitting: Internal Medicine

## 2014-04-19 VITALS — BP 146/74 | HR 72 | Temp 97.9°F | Resp 16 | Ht 73.5 in | Wt 207.6 lb

## 2014-04-19 DIAGNOSIS — Z79899 Other long term (current) drug therapy: Secondary | ICD-10-CM

## 2014-04-19 DIAGNOSIS — E785 Hyperlipidemia, unspecified: Secondary | ICD-10-CM

## 2014-04-19 DIAGNOSIS — Z23 Encounter for immunization: Secondary | ICD-10-CM

## 2014-04-19 DIAGNOSIS — E119 Type 2 diabetes mellitus without complications: Secondary | ICD-10-CM

## 2014-04-19 DIAGNOSIS — I10 Essential (primary) hypertension: Secondary | ICD-10-CM

## 2014-04-19 DIAGNOSIS — E559 Vitamin D deficiency, unspecified: Secondary | ICD-10-CM

## 2014-04-19 LAB — CBC WITH DIFFERENTIAL/PLATELET
BASOS PCT: 1 % (ref 0–1)
Basophils Absolute: 0.1 10*3/uL (ref 0.0–0.1)
EOS ABS: 0.1 10*3/uL (ref 0.0–0.7)
EOS PCT: 2 % (ref 0–5)
HEMATOCRIT: 41.4 % (ref 39.0–52.0)
HEMOGLOBIN: 13.8 g/dL (ref 13.0–17.0)
Lymphocytes Relative: 44 % (ref 12–46)
Lymphs Abs: 2.8 10*3/uL (ref 0.7–4.0)
MCH: 29.1 pg (ref 26.0–34.0)
MCHC: 33.3 g/dL (ref 30.0–36.0)
MCV: 87.3 fL (ref 78.0–100.0)
MONO ABS: 0.6 10*3/uL (ref 0.1–1.0)
MONOS PCT: 9 % (ref 3–12)
NEUTROS PCT: 44 % (ref 43–77)
Neutro Abs: 2.8 10*3/uL (ref 1.7–7.7)
Platelets: 256 10*3/uL (ref 150–400)
RBC: 4.74 MIL/uL (ref 4.22–5.81)
RDW: 14.9 % (ref 11.5–15.5)
WBC: 6.4 10*3/uL (ref 4.0–10.5)

## 2014-04-19 NOTE — Progress Notes (Signed)
Patient ID: Isaiah Jackson, male   DOB: 1933-08-04, 80 y.o.   MRN: [id number]   This very nice 80 y.o.WWM presents for 3 month follow up with Hypertension, Hyperlipidemia, Pre-Diabetes and Vitamin D Deficiency.    Patient is treated for HTN & BP has been controlled at home. Today's BP was 146/74 mmHg. Patient had a MI in 1994, PTCA in 1996 and a Neg Cardiolite in 2006 and has done well since. Patient has had no complaints of any cardiac type chest pain, palpitations, dyspnea/orthopnea/PND, dizziness, claudication, or dependent edema.   Hyperlipidemia is controlled with diet & meds. Patient denies myalgias or other med SE's. 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  Patient admits infrequently checking his blood sugars. Last A1c was  7.3% on 01/17/2014.    Further, the patient also has history of Vitamin D Deficiency (33 in 2012) and supplements vitamin D without any suspected side-effects. Last vitamin D was  40 on 01/17/2014.    Medication List   aspirin 81 MG tablet  Take 81 mg by mouth daily.     fenofibrate micronized 134 MG capsule  Commonly known as:  LOFIBRA  Take 1 capsule (134 mg total) by mouth daily before breakfast.     Magnesium 400 MG Caps  Take 400 mg by mouth daily.     metFORMIN 500 MG tablet  Commonly known as:  GLUCOPHAGE  Take 1 tablet (500 mg total) by mouth 2 (two) times daily with a meal.     simvastatin 40 MG tablet  Commonly known as:  ZOCOR  Take 1 tablet (40 mg total) by mouth daily.     Vitamin D-3 1000 UNITS Caps  Take 1,000 Units by mouth daily.     Allergies  Allergen Reactions  . Capoten [Captopril]     Fatigue/depression  . Lamisil [Terbinafine Hcl] Rash   PMHx:   Past Medical History  Diagnosis Date  . Hyperlipidemia   .  Diabetes mellitus   . Hypertension   . Coronary artery disease     s/p PTA and stenting  . Myocardial infarction   . Benign prostatic hypertrophy     history of post catheterization  . Nephrolithiasis     history of cystoscopy  . Kidney stone   . COPD (chronic obstructive pulmonary disease)   . Vitamin D deficiency    Immunization History  Administered Date(s) Administered  . Influenza Whole 06/16/2012, 03/26/2013  . Influenza, High Dose Seasonal PF 04/19/2014  . Td 05/26/2003   Past Surgical History  Procedure Laterality Date  . Abdominal aortic aneurysm repair  10/23/2005    endograft  . Cytoscopy      for kidney stones  . Shoulder surgery Right    FHx:    Reviewed / unchanged  SHx:    Reviewed / unchanged  Systems Review:  Constitutional: Denies fever, chills, wt changes, headaches, insomnia, fatigue, night sweats, change in appetite. Eyes: Denies redness, blurred vision, diplopia, discharge, itchy, watery eyes.  ENT: Denies discharge, congestion, post nasal drip, epistaxis, sore throat, earache, hearing loss, dental pain, tinnitus, vertigo, sinus pain, snoring.  CV: Denies chest pain, palpitations, irregular heartbeat, syncope, dyspnea, diaphoresis, orthopnea, PND, claudication or edema. Respiratory: denies cough, dyspnea, DOE, pleurisy, hoarseness, laryngitis, wheezing.  Gastrointestinal: Denies  dysphagia, odynophagia, heartburn, reflux, water brash, abdominal pain or cramps, nausea, vomiting, bloating, diarrhea, constipation, hematemesis, melena, hematochezia  or hemorrhoids. Genitourinary: Denies dysuria, frequency, urgency, nocturia, hesitancy, discharge, hematuria or flank pain. Musculoskeletal: Denies arthralgias, myalgias, stiffness, jt. swelling, pain, limping or strain/sprain.  Skin: Denies pruritus, rash, hives, warts, acne, eczema or change in skin lesion(s). Neuro: No weakness, tremor, incoordination, spasms, paresthesia or pain. Psychiatric: Denies confusion,  memory loss or sensory loss. Endo: Denies change in weight, skin or hair change.  Heme/Lymph: No excessive bleeding, bruising or enlarged lymph nodes. Exam:  BP 146/74  Pulse 72  Temp(Src) 97.9 F (36.6 C) (Temporal)  Resp 16  Ht 6' 1.5" (1.867 m)  Wt 207 lb 9.6 oz (94.167 kg)  BMI 27.02 kg/m2  Appears well nourished and in no distress. Eyes: PERRLA, EOMs, conjunctiva no swelling or erythema. Sinuses: No frontal/maxillary tenderness ENT/Mouth: EAC's clear, TM's nl w/o erythema, bulging. Nares clear w/o erythema, swelling, exudates. Oropharynx clear without erythema or exudates. Oral hygiene is good. Tongue normal, non obstructing. Hearing intact.  Neck: Supple. Thyroid nl. Car 2+/2+ without bruits, nodes or JVD. Chest: Respirations nl with BS clear & equal w/o rales, rhonchi, wheezing or stridor.  Cor: Heart sounds normal w/ regular rate and rhythm without sig. murmurs, gallops, clicks, or rubs. Peripheral pulses normal and equal  without edema.  Abdomen: Soft & bowel sounds normal. Non-tender w/o guarding, rebound, hernias, masses, or organomegaly.  Lymphatics: Unremarkable.  Musculoskeletal: Full ROM all peripheral extremities, joint stability, 5/5 strength, and normal gait.  Skin: Warm, dry without exposed rashes, lesions or ecchymosis apparent.  Neuro: Cranial nerves intact, reflexes equal bilaterally. Sensory-motor testing grossly intact. Tendon reflexes grossly intact.  Pysch: Alert & oriented x 3.  Insight and judgement nl & appropriate. No ideations.  Assessment and Plan:  1. Hypertension - Continue monitor blood pressure at home. Continue diet/meds same.  2. Hyperlipidemia - Continue diet/meds, exercise,& lifestyle modifications. Continue monitor periodic cholesterol/liver & renal functions   3. T2_NIDDM w/CKD2 - Continue diet, exercise, lifestyle modifications. Monitor appropriate labs.  4. Vitamin D Deficiency - Continue supplementation.   Recommended regular  exercise, BP monitoring, weight control, and discussed med and SE's. Recommended labs to assess and monitor clinical status. Further disposition pending results of labs.

## 2014-04-19 NOTE — Patient Instructions (Signed)

## 2014-04-20 LAB — HEPATIC FUNCTION PANEL
ALT: 12 U/L (ref 0–53)
AST: 15 U/L (ref 0–37)
Albumin: 3.9 g/dL (ref 3.5–5.2)
Alkaline Phosphatase: 65 U/L (ref 39–117)
Bilirubin, Direct: 0.1 mg/dL (ref 0.0–0.3)
Indirect Bilirubin: 0.5 mg/dL (ref 0.2–1.2)
TOTAL PROTEIN: 6.6 g/dL (ref 6.0–8.3)
Total Bilirubin: 0.6 mg/dL (ref 0.2–1.2)

## 2014-04-20 LAB — BASIC METABOLIC PANEL WITH GFR
BUN: 12 mg/dL (ref 6–23)
CALCIUM: 9.4 mg/dL (ref 8.4–10.5)
CO2: 26 meq/L (ref 19–32)
CREATININE: 0.97 mg/dL (ref 0.50–1.35)
Chloride: 98 mEq/L (ref 96–112)
GFR, EST AFRICAN AMERICAN: 86 mL/min
GFR, Est Non African American: 74 mL/min
Glucose, Bld: 255 mg/dL — ABNORMAL HIGH (ref 70–99)
Potassium: 4.4 mEq/L (ref 3.5–5.3)
Sodium: 133 mEq/L — ABNORMAL LOW (ref 135–145)

## 2014-04-20 LAB — LIPID PANEL
CHOLESTEROL: 141 mg/dL (ref 0–200)
HDL: 45 mg/dL (ref >39)
LDL Cholesterol: 62 mg/dL (ref 0–99)
Total CHOL/HDL Ratio: 3.1 Ratio
Triglycerides: 172 mg/dL — ABNORMAL HIGH (ref <150)
VLDL: 34 mg/dL (ref 0–40)

## 2014-04-20 LAB — MAGNESIUM: MAGNESIUM: 1.7 mg/dL (ref 1.5–2.5)

## 2014-04-20 LAB — INSULIN, FASTING: Insulin fasting, serum: 36.9 u[IU]/mL — ABNORMAL HIGH (ref 2.0–19.6)

## 2014-04-20 LAB — HEMOGLOBIN A1C
Hgb A1c MFr Bld: 7.9 % — ABNORMAL HIGH (ref <5.7)
MEAN PLASMA GLUCOSE: 180 mg/dL — AB (ref <117)

## 2014-04-20 LAB — VITAMIN D 25 HYDROXY (VIT D DEFICIENCY, FRACTURES): VIT D 25 HYDROXY: 37 ng/mL (ref 30–89)

## 2014-04-20 LAB — TSH: TSH: 1.853 u[IU]/mL (ref 0.350–4.500)

## 2014-07-16 ENCOUNTER — Other Ambulatory Visit: Payer: Self-pay | Admitting: Physician Assistant

## 2014-07-28 ENCOUNTER — Encounter: Payer: Self-pay | Admitting: Physician Assistant

## 2014-07-28 ENCOUNTER — Ambulatory Visit (INDEPENDENT_AMBULATORY_CARE_PROVIDER_SITE_OTHER): Payer: Medicare Other | Admitting: Physician Assistant

## 2014-07-28 VITALS — BP 110/70 | HR 60 | Temp 97.7°F | Resp 16 | Ht 73.0 in | Wt 210.0 lb

## 2014-07-28 DIAGNOSIS — Z79899 Other long term (current) drug therapy: Secondary | ICD-10-CM

## 2014-07-28 DIAGNOSIS — I10 Essential (primary) hypertension: Secondary | ICD-10-CM

## 2014-07-28 DIAGNOSIS — E785 Hyperlipidemia, unspecified: Secondary | ICD-10-CM

## 2014-07-28 DIAGNOSIS — E1329 Other specified diabetes mellitus with other diabetic kidney complication: Secondary | ICD-10-CM

## 2014-07-28 DIAGNOSIS — I2510 Atherosclerotic heart disease of native coronary artery without angina pectoris: Secondary | ICD-10-CM

## 2014-07-28 DIAGNOSIS — J42 Unspecified chronic bronchitis: Secondary | ICD-10-CM

## 2014-07-28 DIAGNOSIS — E559 Vitamin D deficiency, unspecified: Secondary | ICD-10-CM

## 2014-07-28 LAB — CBC WITH DIFFERENTIAL/PLATELET
BASOS ABS: 0.1 10*3/uL (ref 0.0–0.1)
Basophils Relative: 1 % (ref 0–1)
EOS PCT: 1 % (ref 0–5)
Eosinophils Absolute: 0.1 10*3/uL (ref 0.0–0.7)
HCT: 41.7 % (ref 39.0–52.0)
Hemoglobin: 14.1 g/dL (ref 13.0–17.0)
LYMPHS ABS: 3.7 10*3/uL (ref 0.7–4.0)
Lymphocytes Relative: 42 % (ref 12–46)
MCH: 28.9 pg (ref 26.0–34.0)
MCHC: 33.8 g/dL (ref 30.0–36.0)
MCV: 85.5 fL (ref 78.0–100.0)
MONO ABS: 0.7 10*3/uL (ref 0.1–1.0)
MPV: 10.1 fL (ref 8.6–12.4)
Monocytes Relative: 8 % (ref 3–12)
Neutro Abs: 4.2 10*3/uL (ref 1.7–7.7)
Neutrophils Relative %: 48 % (ref 43–77)
Platelets: 286 10*3/uL (ref 150–400)
RBC: 4.88 MIL/uL (ref 4.22–5.81)
RDW: 13.5 % (ref 11.5–15.5)
WBC: 8.8 10*3/uL (ref 4.0–10.5)

## 2014-07-28 LAB — HEMOGLOBIN A1C
Hgb A1c MFr Bld: 8.5 % — ABNORMAL HIGH (ref <5.7)
Mean Plasma Glucose: 197 mg/dL — ABNORMAL HIGH (ref <117)

## 2014-07-28 MED ORDER — FENOFIBRATE 145 MG PO TABS: 145.0000 mg | ORAL_TABLET | Freq: Every day | ORAL | Status: DC

## 2014-07-28 NOTE — Progress Notes (Signed)
Assessment and Plan:  Hypertension: Continue medication, monitor blood pressure at home. Continue DASH diet.  Reminder to go to the ER if any CP, SOB, nausea, dizziness, severe HA, changes vision/speech, left arm numbness and tingling and jaw pain. Cholesterol: Continue diet and exercise. Check cholesterol. Will switch fenofibrate 134--> 145 due to cost  Diabetes with diabetic chronic kidney disease and with other circulatory complications-Continue diet and exercise. Check A1C Vitamin D Def- check level and continue medications.  COPD- controlled at this time  Continue diet and meds as discussed. Further disposition pending results of labs. Discussed med's effects and SE's.   HPI 81 y.o. male  presents for 3 month follow up with hypertension, hyperlipidemia, diabetes and vitamin D. His blood pressure has been controlled at home, today their BP is BP: 110/70 mmHg He does workout, dances and walks. He denies chest pain, shortness of breath, dizziness.  He has a history of CAD s/p stent, on bASA and denies any cardio sx's.  He is on cholesterol medication, fenofibrate and simvastatin 40, states fenoribrate 134 price has gone up and denies myalgias. His cholesterol is at goal. The cholesterol was:  04/19/2014: Cholesterol, Total 141; HDL Cholesterol by NMR 45; LDL (calc) 62; Triglycerides 172* He has been working on diet and exercise for Diabetes with diabetic chronic kidney disease and with other circulatory complications, he is on bASA, he is not on ACE/ARB due to hypotension, has not been checking sugars bc out of strips and denies paresthesia of the feet, polydipsia, polyuria and visual disturbances. Last A1C was: 04/19/2014: Hemoglobin-A1c 7.9* Patient is on Vitamin D supplement. 04/19/2014: Vit D, 25-Hydroxy 37  He has COPD via CXR, denies SOB.   Current Medications:  Current Outpatient Prescriptions on File Prior to Visit  Medication Sig Dispense Refill  . aspirin 81 MG tablet Take 81 mg by  mouth daily.    . Cholecalciferol (VITAMIN D-3) 1000 UNITS CAPS Take 1,000 Units by mouth daily.    . fenofibrate micronized (LOFIBRA) 134 MG capsule TAKE 1 CAPSULE (134 MG TOTAL) BY MOUTH DAILY BEFORE BREAKFAST. 90 capsule 1  . Magnesium 400 MG CAPS Take 400 mg by mouth daily.    . metFORMIN (GLUCOPHAGE) 500 MG tablet Take 1 tablet (500 mg total) by mouth 2 (two) times daily with a meal. 360 tablet 1  . simvastatin (ZOCOR) 40 MG tablet Take 1 tablet (40 mg total) by mouth daily. 30 tablet 3   No current facility-administered medications on file prior to visit.   Medical History:  Past Medical History  Diagnosis Date  . Hyperlipidemia   . Diabetes mellitus   . Hypertension   . Coronary artery disease     s/p PTA and stenting  . Myocardial infarction   . Benign prostatic hypertrophy     history of post catheterization  . Nephrolithiasis     history of cystoscopy  . Kidney stone   . COPD (chronic obstructive pulmonary disease)   . Vitamin D deficiency    Allergies:  Allergies  Allergen Reactions  . Capoten [Captopril]     Fatigue/depression  . Lamisil [Terbinafine Hcl] Rash     Review of Systems:  Review of Systems  Constitutional: Negative.   HENT: Negative.   Eyes: Negative.   Respiratory: Negative.   Cardiovascular: Negative.   Gastrointestinal: Negative.   Genitourinary: Negative.   Musculoskeletal: Negative.   Skin: Negative.   Neurological: Negative.   Endo/Heme/Allergies: Negative.   Psychiatric/Behavioral: Negative.     Family history-  Review and unchanged Social history- Review and unchanged Physical Exam: BP 110/70 mmHg  Pulse 60  Temp(Src) 97.7 F (36.5 C)  Resp 16  Ht 6\' 1"  (1.854 m)  Wt 210 lb (95.255 kg)  BMI 27.71 kg/m2 Wt Readings from Last 3 Encounters:  07/28/14 210 lb (95.255 kg)  04/19/14 207 lb 9.6 oz (94.167 kg)  01/17/14 208 lb (94.348 kg)   General Appearance: Well nourished, in no apparent distress. Eyes: PERRLA, EOMs,  conjunctiva no swelling or erythema Sinuses: No Frontal/maxillary tenderness ENT/Mouth: Ext aud canals clear, TMs without erythema, bulging. No erythema, swelling, or exudate on post pharynx.  Tonsils not swollen or erythematous. Hearing normal.  Neck: Supple, thyroid normal.  Respiratory: Respiratory effort normal, BS equal bilaterally without rales, rhonchi, wheezing or stridor.  Cardio: RRR with no MRGs. Brisk peripheral pulses without edema.  Abdomen: Soft, + BS.  Non tender, no guarding, rebound, hernias, masses. Lymphatics: Non tender without lymphadenopathy.  Musculoskeletal: Full ROM, 5/5 strength, normal gait.  Skin: Warm, dry without rashes, lesions, ecchymosis.  Neuro: Cranial nerves intact. No cerebellar symptoms. Sensation intact.  Psych: Awake and oriented X 3, normal affect, Insight and Judgment appropriate.    Vicie Mutters, PA-C 11:15 AM [facility]

## 2014-07-28 NOTE — Patient Instructions (Signed)
Diabetes is a very complicated disease...lets simplify it.  An easy way to look at it to understand the complications is if you think of the extra sugar floating in your blood stream as glass shards floating through your blood stream.    Diabetes affects your small vessels first: 1) The glass shards (sugar) scraps down the tiny blood vessels in your eyes and lead to diabetic retinopathy, the leading cause of blindness in the [place]. Diabetes is the leading cause of newly diagnosed adult (36 to 79 years of age) blindness in the [place].  2) The glass shards scratches down the tiny vessels of your legs leading to nerve damage called neuropathy and can lead to amputations of your feet. More than 60% of all non-traumatic amputations of lower limbs occur in people with diabetes.  3) Over time the small vessels in your brain are shredded and closed off, individually this does not cause any problems but over a long period of time many of the small vessels being blocked can lead to Vascular Dementia.   4) Your kidney's are a filter system and have a "net" that keeps certain things in the body and lets bad things out. Sugar shreds this net and leads to kidney damage and eventually failure. Decreasing the sugar that is destroying the net and certain blood pressure medications can help stop or decrease progression of kidney disease. Diabetes was the primary cause of kidney failure in 44 percent of all new cases in 2011.  5) Diabetes also destroys the small vessels in your penis that lead to erectile dysfunction. Eventually the vessels are so damaged that you may not be responsive to cialis or viagra.   Diabetes and your large vessels: Your larger vessels consist of your coronary arteries in your heart and the carotid vessels to your brain. Diabetes or even increased sugars put you at 300% increased risk of heart attack and stroke and this is why.. The sugar scrapes down your large blood vessels and your body  sees this as an internal injury and tries to repair itself. Just like you get a scab on your skin, your platelets will stick to the blood vessel wall trying to heal it. This is why we have diabetics on low dose aspirin daily, this prevents the platelets from sticking and can prevent plaque formation. In addition, your body takes cholesterol and tries to shove it into the open wound. This is why we want your LDL, or bad cholesterol, below 70.   The combination of platelets and cholesterol over 5-10 years forms plaque that can break off and cause a heart attack or stroke.   PLEASE REMEMBER:  Diabetes is preventable! Up to 68 percent of complications and morbidities among individuals with type 2 diabetes can be prevented, delayed, or effectively treated and minimized with regular visits to a health professional, appropriate monitoring and medication, and a healthy diet and lifestyle.  Targets for Glucose Readings: Time of Check Target for patients WITHOUT Diabetes Target for DIABETICS  Before Meals Less than 100  less than 150  Two hours after meals Less than 200  Less than 250       Bad carbs also include fruit juice, alcohol, and sweet tea. These are empty calories that do not signal to your brain that you are full.   Please remember the good carbs are still carbs which convert into sugar. So please measure them out no more than 1/2-1 cup of rice, oatmeal, pasta, and beans  Veggies  are however free foods! Pile them on.   Not all fruit is created equal. Please see the list below, the fruit at the bottom is higher in sugars than the fruit at the top. Please avoid all dried fruits.

## 2014-07-29 LAB — BASIC METABOLIC PANEL WITH GFR
BUN: 13 mg/dL (ref 6–23)
CALCIUM: 10.1 mg/dL (ref 8.4–10.5)
CO2: 26 meq/L (ref 19–32)
Chloride: 99 mEq/L (ref 96–112)
Creat: 1.13 mg/dL (ref 0.50–1.35)
GFR, Est African American: 72 mL/min
GFR, Est Non African American: 62 mL/min
Glucose, Bld: 139 mg/dL — ABNORMAL HIGH (ref 70–99)
POTASSIUM: 4.4 meq/L (ref 3.5–5.3)
Sodium: 134 mEq/L — ABNORMAL LOW (ref 135–145)

## 2014-07-29 LAB — LIPID PANEL
CHOLESTEROL: 149 mg/dL (ref 0–200)
HDL: 42 mg/dL (ref >39)
LDL CALC: 66 mg/dL (ref 0–99)
Total CHOL/HDL Ratio: 3.5 Ratio
Triglycerides: 207 mg/dL — ABNORMAL HIGH (ref <150)
VLDL: 41 mg/dL — AB (ref 0–40)

## 2014-07-29 LAB — HEPATIC FUNCTION PANEL
ALT: 16 U/L (ref 0–53)
AST: 19 U/L (ref 0–37)
Albumin: 4.1 g/dL (ref 3.5–5.2)
Alkaline Phosphatase: 74 U/L (ref 39–117)
Bilirubin, Direct: 0.1 mg/dL (ref 0.0–0.3)
Indirect Bilirubin: 0.3 mg/dL (ref 0.2–1.2)
TOTAL PROTEIN: 6.8 g/dL (ref 6.0–8.3)
Total Bilirubin: 0.4 mg/dL (ref 0.2–1.2)

## 2014-07-29 LAB — VITAMIN D 25 HYDROXY (VIT D DEFICIENCY, FRACTURES): VIT D 25 HYDROXY: 21 ng/mL — AB (ref 30–100)

## 2014-07-29 LAB — INSULIN, FASTING: INSULIN FASTING, SERUM: 18.4 u[IU]/mL (ref 2.0–19.6)

## 2014-07-29 LAB — TSH: TSH: 1.978 u[IU]/mL (ref 0.350–4.500)

## 2014-07-29 LAB — MAGNESIUM: Magnesium: 1.9 mg/dL (ref 1.5–2.5)

## 2014-08-03 ENCOUNTER — Other Ambulatory Visit: Payer: Self-pay

## 2014-08-03 DIAGNOSIS — I10 Essential (primary) hypertension: Secondary | ICD-10-CM

## 2014-08-03 DIAGNOSIS — E871 Hypo-osmolality and hyponatremia: Secondary | ICD-10-CM

## 2014-08-03 DIAGNOSIS — Z87891 Personal history of nicotine dependence: Secondary | ICD-10-CM

## 2014-10-17 ENCOUNTER — Encounter: Payer: Self-pay | Admitting: Internal Medicine

## 2014-11-07 ENCOUNTER — Ambulatory Visit (INDEPENDENT_AMBULATORY_CARE_PROVIDER_SITE_OTHER): Payer: Medicare Other | Admitting: Internal Medicine

## 2014-11-07 ENCOUNTER — Encounter: Payer: Self-pay | Admitting: Internal Medicine

## 2014-11-07 VITALS — BP 124/70 | HR 72 | Temp 97.0°F | Resp 16 | Ht 73.0 in | Wt 208.2 lb

## 2014-11-07 DIAGNOSIS — I10 Essential (primary) hypertension: Secondary | ICD-10-CM

## 2014-11-07 DIAGNOSIS — Z1331 Encounter for screening for depression: Secondary | ICD-10-CM

## 2014-11-07 DIAGNOSIS — Z1211 Encounter for screening for malignant neoplasm of colon: Secondary | ICD-10-CM

## 2014-11-07 DIAGNOSIS — I714 Abdominal aortic aneurysm, without rupture, unspecified: Secondary | ICD-10-CM

## 2014-11-07 DIAGNOSIS — E559 Vitamin D deficiency, unspecified: Secondary | ICD-10-CM

## 2014-11-07 DIAGNOSIS — Z9181 History of falling: Secondary | ICD-10-CM

## 2014-11-07 DIAGNOSIS — E785 Hyperlipidemia, unspecified: Secondary | ICD-10-CM

## 2014-11-07 DIAGNOSIS — Z79899 Other long term (current) drug therapy: Secondary | ICD-10-CM

## 2014-11-07 DIAGNOSIS — Z125 Encounter for screening for malignant neoplasm of prostate: Secondary | ICD-10-CM

## 2014-11-07 DIAGNOSIS — I2510 Atherosclerotic heart disease of native coronary artery without angina pectoris: Secondary | ICD-10-CM | POA: Insufficient documentation

## 2014-11-07 DIAGNOSIS — E1329 Other specified diabetes mellitus with other diabetic kidney complication: Secondary | ICD-10-CM

## 2014-11-07 DIAGNOSIS — J42 Unspecified chronic bronchitis: Secondary | ICD-10-CM

## 2014-11-07 NOTE — Progress Notes (Signed)
Patient ID: Isaiah Jackson, male   DOB: 1934-06-15, 81 y.o.   MRN: [id number]   Annual Comprehensive Examination  This very nice 81 y.o. Avant presents for complete physical.  Patient has been followed for labile HTN, T2_NIDDM,hx/o AAA, Hyperlipidemia, and Vitamin D Deficiency.   Patient is monitored expectantly for labile HTN and has significant hx/o MI in 1994 and had PTCA in 1996 with a neg Cardiolite in 2006.  In 2007 he had stenting of an AAA. Patient's BP has been controlled at home.Today's BP: 124/70 mmHg. Patient denies any cardiac symptoms as chest pain, palpitations, shortness of breath, dizziness or ankle swelling.   Patient's hyperlipidemia is controlled with diet and medications. Patient denies myalgias or other medication SE's. Last lipids were at goal - Total Chol 149; HDL 42; LDL  66; Trig 207 on 07/28/2014:   Patient has T2_NIDDM with Stage 2 CKD (GFR 66 ml/min) since 2003 and patient denies reactive hypoglycemic symptoms, visual blurring, diabetic polys or paresthesias. Last A1c was   8.5% 07/28/2014.   Finally, patient has history of Vitamin D Deficiency of 33 in 2012 and last vitamin D was still very low at 21 on 07/28/2014.       Medication Sig  . aspirin 81 MG tablet Take 81 mg by mouth daily.  [name] VITAMIN D 1000 UNITS Take 1,000 Units by mouth daily.  . fenofibrate  145 MG tab Take 1 tablet (145 mg total) by mouth daily.  . Magnesium 400 MG  Take 400 mg by mouth daily.  . metFORMIN  500 MG tab Take 1 tablet (500 mg total) by mouth 2 (two) times daily with a meal.  . simvastatin (ZOCOR) 40 MG tab Take 1 tablet (40 mg total) by mouth daily.   Allergies  Allergen Reactions  . Capoten [Captopril]     Fatigue/depression  . Lamisil [Terbinafine Hcl] Rash   Past Medical History  Diagnosis Date  . Hyperlipidemia   . Diabetes mellitus   . Hypertension   . Coronary artery disease     s/p PTA and stenting  . Myocardial infarction   . Benign prostatic hypertrophy     history  of post catheterization  . Nephrolithiasis     history of cystoscopy  . Kidney stone   . COPD (chronic obstructive pulmonary disease)   . Vitamin D deficiency    Health Maintenance  Topic Date Due  . PNA vac Low Risk Adult (1 of 2 - PCV13) 12/20/2000  . COLONOSCOPY  07/08/2012  . OPHTHALMOLOGY EXAM  03/17/2013  . TETANUS/TDAP  05/25/2013  . URINE MICROALBUMIN  10/14/2014  . ZOSTAVAX  12/10/2017 (Originally 12/21/1995)  . FOOT EXAM  01/18/2015  . HEMOGLOBIN A1C  01/26/2015  . INFLUENZA VACCINE  02/06/2015   Immunization History  Administered Date(s) Administered  . Influenza Whole 06/16/2012, 03/26/2013  . Influenza, High Dose Seasonal PF 04/19/2014  . Td 05/26/2003   Past Surgical History  Procedure Laterality Date  . Abdominal aortic aneurysm repair  10/23/2005    endograft  . Cytoscopy      for kidney stones  . Shoulder surgery Right    Family History  Problem Relation Age of Onset  . Diabetes Mother   . Heart disease Mother   . Heart attack Mother 65    mother passed as a result  . Diabetes Daughter   . Diabetes Sister    History   Social History  . Marital Status: Married    Spouse Name: N/A  .  Number of Children: N/A  . Years of Education: N/A   Occupational History  . Not on file.   Social History Main Topics  . Smoking status: Former Smoker    Quit date: 07/08/1992  . Smokeless tobacco: Not on file  . Alcohol Use: No  . Drug Use: No  . Sexual Activity: Not on file    ROS Constitutional: Denies fever, chills, weight loss/gain, headaches, insomnia,  night sweats or change in appetite. Does c/o fatigue. Eyes: Denies redness, blurred vision, diplopia, discharge, itchy or watery eyes.  ENT: Denies discharge, congestion, post nasal drip, epistaxis, sore throat, earache, hearing loss, dental pain, Tinnitus, Vertigo, Sinus pain or snoring.  Cardio: Denies chest pain, palpitations, irregular heartbeat, syncope, dyspnea, diaphoresis, orthopnea, PND,  claudication or edema Respiratory: denies cough, dyspnea, DOE, pleurisy, hoarseness, laryngitis or wheezing.  Gastrointestinal: Denies dysphagia, heartburn, reflux, water brash, pain, cramps, nausea, vomiting, bloating, diarrhea, constipation, hematemesis, melena, hematochezia, jaundice or hemorrhoids Genitourinary: Denies dysuria, frequency, urgency, nocturia, hesitancy, discharge, hematuria or flank pain Musculoskeletal: Denies arthralgia, myalgia, stiffness, Jt. Swelling, pain, limp or strain/sprain. Denies Falls. Skin: Denies puritis, rash, hives, warts, acne, eczema or change in skin lesion Neuro: No weakness, tremor, incoordination, spasms, paresthesia or pain Psychiatric: Denies confusion, memory loss or sensory loss. Denies Depression. Endocrine: Denies change in weight, skin, hair change, nocturia, and paresthesia, diabetic polys, visual blurring or hyper / hypo glycemic episodes.  Heme/Lymph: No excessive bleeding, bruising or enlarged lymph nodes.  Physical Exam  BP 124/70   Pulse 72  Temp 97 F   Resp 16  Ht 6\' 1"    Wt 208 lb 3.2 oz     BMI 27.47   General Appearance: Well nourished, in no apparent distress. Eyes: PERRLA, EOMs, conjunctiva no swelling or erythema, normal fundi and vessels. Sinuses: No frontal/maxillary tenderness ENT/Mouth: EACs patent / TMs  nl. Nares clear without erythema, swelling, mucoid exudates. Oral hygiene is good. No erythema, swelling, or exudate. Tongue normal, non-obstructing. Tonsils not swollen or erythematous. Hearing normal.  Neck: Supple, thyroid normal. No bruits, nodes or JVD. Respiratory: Respiratory effort normal.  BS equal and clear bilateral without rales, rhonci, wheezing or stridor. Cardio: Heart sounds are normal with regular rate and rhythm and no murmurs, rubs or gallops. Peripheral pulses are normal and equal bilaterally without edema. No aortic or femoral bruits. Chest: symmetric with normal excursions and percussion.  Abdomen:  Flat, soft, with bowl sounds. Nontender, no guarding, rebound, hernias, masses, or organomegaly.  Lymphatics: Non tender without lymphadenopathy.  Genitourinary: No hernias.Testes nl. DRE - prostate nl for age - smooth & firm w/o nodules. Musculoskeletal: Full ROM all peripheral extremities, joint stability, 5/5 strength, and normal gait. Skin: Warm and dry without rashes, lesions, cyanosis, clubbing or  ecchymosis.  Neuro: Cranial nerves intact, reflexes equal bilaterally. Normal muscle tone, no cerebellar symptoms. Sensation intact.  Pysch: Awake and oriented X 3 with normal affect, insight and judgment appropriate.   Assessment and Plan  1. Essential hypertension  - EKG 12-Lead - [place], RETROPERITNL ABD,  LTD - TSH  2. Hyperlipidemia  - Lipid panel  3. T2_NIDDM w/CKD 2 (GFR 76 ml/min)  - Microalbumin / creatinine urine ratio - HM DIABETES FOOT EXAM - LOW EXTREMITY NEUR EXAM DOCUM - Hemoglobin A1c - Insulin, random  4. Vitamin D deficiency  - Vit D  25 hydroxy   5. Chronic bronchitis,   6. ASCAD s/p PTCA/Stents   7. Screen for colon cancer  - POC Hemoccult Bld/Stl  8. Screening for prostate cancer  - PSA  9. Depression screen   10. At low risk for fall   11. Abdominal aortic aneurysm   12. Medication management  - Urine Microscopic - CBC with Differential/Platelet - BASIC METABOLIC PANEL WITH GFR - Hepatic function panel - Magnesium   Continue prudent diet as discussed, weight control, BP monitoring, regular exercise, and medications as discussed.  Discussed med effects and SE's. Routine screening labs and tests as requested with regular follow-up as recommended. Over 40 minutes of exam, counseling &  chart review was performed

## 2014-11-07 NOTE — Patient Instructions (Signed)
++++++++++++++++++++++++++++++++++  Recommend Low dose or baby Aspirin 81 mg daily   To reduce risk of Colon Cancer 20 %, Skin Cancer 26 % , Melanoma 46% and   Pancreatic cancer 60%  +++++++++++++++++++++++++++++++++ +++++++++++++++++++++++++++++++++++++++++++++++++++++++++++  Vitamin D goal is between 70-100.   Please make sure that you are taking your Vitamin D as directed.   It is very important as a natural antiinflammatory   helping hair, skin, and nails, as well as reducing stroke and heart attack risk.   It helps your bones and helps with mood.  It also decreases numerous cancer risks so please take it as directed.   +++++++++++++++++++++++++++++++++++++++++++++++++++++++++++  Recommend the book "The END of DIETING" by Dr Excell Seltzer   & the book "The END of DIABETES " by Dr Excell Seltzer  At [email or web address] - get book & Audio CD's     Being diabetic has a  300% increased risk for heart attack, stroke, cancer, and alzheimer- type vascular dementia. It is very important that you work harder with diet by avoiding all foods that are white. Avoid white rice (brown & wild rice is OK), white potatoes (sweetpotatoes in moderation is OK), White bread or wheat bread or anything made out of white flour like bagels, donuts, rolls, buns, biscuits, cakes, pastries, cookies, pizza crust, and pasta (made from white flour & egg whites) - vegetarian pasta or spinach or wheat pasta is OK. Multigrain breads like Arnold's or [employer], or multigrain sandwich thins or flatbreads.  Diet, exercise and weight loss can reverse and cure diabetes in the early stages.  Diet, exercise and weight loss is very important in the control and prevention of complications of diabetes which affects every system in your body, ie. Brain - dementia/stroke, eyes - glaucoma/blindness, heart - heart attack/heart failure, kidneys - dialysis, stomach - gastric paralysis, intestines - malabsorption, nerves - severe painful  neuritis, circulation - gangrene & loss of a leg(s), and finally cancer and Alzheimers.    I recommend avoid fried & greasy foods,  sweets/candy, white rice (brown or wild rice or Quinoa is OK), white potatoes (sweet potatoes are OK) - anything made from white flour - bagels, doughnuts, rolls, buns, biscuits,white and wheat breads, pizza crust and traditional pasta made of white flour & egg white(vegetarian pasta or spinach or wheat pasta is OK).  Multi-grain bread is OK - like multi-grain flat bread or sandwich thins. Avoid alcohol in excess. Exercise is also important.    Eat all the vegetables you want - avoid meat, especially red meat and dairy - especially cheese.  Cheese is the most concentrated form of trans-fats which is the worst thing to clog up our arteries. Veggie cheese is OK which can be found in the fresh produce section at [facility] or [employer] or [employer]  ++++++++++++++++++++++++++++++++++++++++++++++++++++++++ Preventive Care for Adults A healthy lifestyle and preventive care can promote health and wellness. Preventive health guidelines for men include the following key practices:  A routine yearly physical is a good way to check with your health care provider about your health and preventative screening. It is a chance to share any concerns and updates on your health and to receive a thorough exam.  Visit your dentist for a routine exam and preventative care every 6 months. Brush your teeth twice a day and floss once a day. Good oral hygiene prevents tooth decay and gum disease.  The frequency of eye exams is based on your age, health, family medical history, use of contact lenses,  and other factors. Follow your health care provider's recommendations for frequency of eye exams.  Eat a healthy diet. Foods such as vegetables, fruits, whole grains, low-fat dairy products, and lean protein foods contain the nutrients you need without too many calories. Decrease your intake of  foods high in solid fats, added sugars, and salt. Eat the right amount of calories for you.Get information about a proper diet from your health care provider, if necessary.  Regular physical exercise is one of the most important things you can do for your health. Most adults should get at least 150 minutes of moderate-intensity exercise (any activity that increases your heart rate and causes you to sweat) each week. In addition, most adults need muscle-strengthening exercises on 2 or more days a week.  Maintain a healthy weight. The body mass index (BMI) is a screening tool to identify possible weight problems. It provides an estimate of body fat based on height and weight. Your health care provider can find your BMI and can help you achieve or maintain a healthy weight.For adults 20 years and older:  A BMI below 18.5 is considered underweight.  A BMI of 18.5 to 24.9 is normal.  A BMI of 25 to 29.9 is considered overweight.  A BMI of 30 and above is considered obese.  Maintain normal blood lipids and cholesterol levels by exercising and minimizing your intake of saturated fat. Eat a balanced diet with plenty of fruit and vegetables. Blood tests for lipids and cholesterol should begin at age 59 and be repeated every 5 years. If your lipid or cholesterol levels are high, you are over 50, or you are at high risk for heart disease, you may need your cholesterol levels checked more frequently.Ongoing high lipid and cholesterol levels should be treated with medicines if diet and exercise are not working.  If you smoke, find out from your health care provider how to quit. If you do not use tobacco, do not start.  Lung cancer screening is recommended for adults aged 79-80 years who are at high risk for developing lung cancer because of a history of smoking. A yearly low-dose CT scan of the lungs is recommended for people who have at least a 30-pack-year history of smoking and are a current smoker or  have quit within the past 15 years. A pack year of smoking is smoking an average of 1 pack of cigarettes a day for 1 year (for example: 1 pack a day for 30 years or 2 packs a day for 15 years). Yearly screening should continue until the smoker has stopped smoking for at least 15 years. Yearly screening should be stopped for people who develop a health problem that would prevent them from having lung cancer treatment.  If you choose to drink alcohol, do not have more than 2 drinks per day. One drink is considered to be 12 ounces (355 mL) of beer, 5 ounces (148 mL) of wine, or 1.5 ounces (44 mL) of liquor.  Avoid use of street drugs. Do not share needles with anyone. Ask for help if you need support or instructions about stopping the use of drugs.  High blood pressure causes heart disease and increases the risk of stroke. Your blood pressure should be checked at least every 1-2 years. Ongoing high blood pressure should be treated with medicines, if weight loss and exercise are not effective.  If you are 70-58 years old, ask your health care provider if you should take aspirin to prevent heart disease.  Diabetes screening involves taking a blood sample to check your fasting blood sugar level. Testing should be considered at a younger age or be carried out more frequently if you are overweight and have at least 1 risk factor for diabetes.  Colorectal cancer can be detected and often prevented. Most routine colorectal cancer screening begins at the age of 47 and continues through age 58. However, your health care provider may recommend screening at an earlier age if you have risk factors for colon cancer. On a yearly basis, your health care provider may provide home test kits to check for hidden blood in the stool. Use of a small camera at the end of a tube to directly examine the colon (sigmoidoscopy or colonoscopy) can detect the earliest forms of colorectal cancer. Talk to your health care provider about  this at age 59, when routine screening begins. Direct exam of the colon should be repeated every 5-10 years through age 73, unless early forms of precancerous polyps or small growths are found.  Hepatitis C blood testing is recommended for all people born from [birth year] through 1965 and any individual with known risks for hepatitis C.  Screening for abdominal aortic aneurysm (AAA)  by ultrasound is recommended for people who have history of high blood pressure or who are current or former smokers.  Healthy men should  receive prostate-specific antigen (PSA) blood tests as part of routine cancer screening. Talk with your health care provider about prostate cancer screening.  Testicular cancer screening is  recommended for adult males. Screening includes self-exam, a health care provider exam, and other screening tests. Consult with your health care provider about any symptoms you have or any concerns you have about testicular cancer.  Use sunscreen. Apply sunscreen liberally and repeatedly throughout the day. You should seek shade when your shadow is shorter than you. Protect yourself by wearing long sleeves, pants, a wide-brimmed hat, and sunglasses year round, whenever you are outdoors.  Once a month, do a whole-body skin exam, using a mirror to look at the skin on your back. Tell your health care provider about new moles, moles that have irregular borders, moles that are larger than a pencil eraser, or moles that have changed in shape or color.  Stay current with required vaccines (immunizations).  Influenza vaccine. All adults should be immunized every year.  Tetanus, diphtheria, and acellular pertussis (Td, Tdap) vaccine. An adult who has not previously received Tdap or who does not know his vaccine status should receive 1 dose of Tdap. This initial dose should be followed by tetanus and diphtheria toxoids (Td) booster doses every 10 years. Adults with an unknown or incomplete history of completing  a 3-dose immunization series with Td-containing vaccines should begin or complete a primary immunization series including a Tdap dose. Adults should receive a Td booster every 10 years.  Zoster vaccine. One dose is recommended for adults aged 48 years or older unless certain conditions are present.    PREVNAR - Pneumococcal 13-valent conjugate (PCV13) vaccine. When indicated, a person who is uncertain of his immunization history and has no record of immunization should receive the PCV13 vaccine. An adult aged 95 years or older who has certain medical conditions and has not been previously immunized should receive 1 dose of PCV13 vaccine. This PCV13 should be followed with a dose of pneumococcal polysaccharide (PPSV23) vaccine. The PPSV23 vaccine dose should be obtained at least 8 weeks after the dose of PCV13 vaccine. An adult aged 51 years or  older who has certain medical conditions and previously received 1 or more doses of PPSV23 vaccine should receive 1 dose of PCV13. The PCV13 vaccine dose should be obtained 1 or more years after the last PPSV23 vaccine dose.    PNEUMOVAX - Pneumococcal polysaccharide (PPSV23) vaccine. When PCV13 is also indicated, PCV13 should be obtained first. All adults aged 69 years and older should be immunized. An adult younger than age 25 years who has certain medical conditions should be immunized. Any person who resides in a nursing home or long-term care facility should be immunized. An adult smoker should be immunized. People with an immunocompromised condition and certain other conditions should receive both PCV13 and PPSV23 vaccines. People with human immunodeficiency virus (HIV) infection should be immunized as soon as possible after diagnosis. Immunization during chemotherapy or radiation therapy should be avoided. Routine use of PPSV23 vaccine is not recommended for American Indians, [place]atives, or people younger than 65 years unless there are medical conditions  that require PPSV23 vaccine. When indicated, people who have unknown immunization and have no record of immunization should receive PPSV23 vaccine. One-time revaccination 5 years after the first dose of PPSV23 is recommended for people aged 19-64 years who have chronic kidney failure, nephrotic syndrome, asplenia, or immunocompromised conditions. People who received 1-2 doses of PPSV23 before age 57 years should receive another dose of PPSV23 vaccine at age 37 years or later if at least 5 years have passed since the previous dose. Doses of PPSV23 are not needed for people immunized with PPSV23 at or after age 2 years.    Hepatitis A vaccine. Adults who wish to be protected from this disease, have certain high-risk conditions, work with hepatitis A-infected animals, work in hepatitis A research labs, or travel to or work in countries with a high rate of hepatitis A should be immunized. Adults who were previously unvaccinated and who anticipate close contact with an international adoptee during the first 60 days after arrival in the [place] from a country with a high rate of hepatitis A should be immunized.    Hepatitis B vaccine. Adults should be immunized if they wish to be protected from this disease, have certain high-risk conditions, may be exposed to blood or other infectious body fluids, are household contacts or sex partners of hepatitis B positive people, are clients or workers in certain care facilities, or travel to or work in countries with a high rate of hepatitis B.   Preventive Service / Frequency   Ages 82 and over  Blood pressure check.  Lipid and cholesterol check.  Lung cancer screening. / Every year if you are aged 30-80 years and have a 30-pack-year history of smoking and currently smoke or have quit within the past 15 years. Yearly screening is stopped once you have quit smoking for at least 15 years or develop a health problem that would prevent you from having lung  cancer treatment.  Fecal occult blood test (FOBT) of stool. You may not have to do this test if you get a colonoscopy every 10 years.  Flexible sigmoidoscopy** or colonoscopy.** / Every 5 years for a flexible sigmoidoscopy or every 10 years for a colonoscopy beginning at age 80 and continuing until age 59.  Hepatitis C blood test.** / For all people born from [birth year] through 1963 and any individual with known risks for hepatitis C.  Abdominal aortic aneurysm (AAA) screening./ Screening current or former smokers or have Hypertension.  Skin self-exam. / Monthly.  Influenza  vaccine. / Every year.  Tetanus, diphtheria, and acellular pertussis (Tdap/Td) vaccine.** / 1 dose of Td every 10 years.   Zoster vaccine.** / 1 dose for adults aged 1 years or older.         Pneumococcal 13-valent conjugate (PCV13) vaccine.    Pneumococcal polysaccharide (PPSV23) vaccine.     Hepatitis A vaccine.** / Consult your health care provider.  Hepatitis B vaccine.** / Consult your health care provider. Screening for abdominal aortic aneurysm (AAA)  by ultrasound is recommended for people who have history of high blood pressure or who are current or former smokers.

## 2014-11-08 LAB — HEMOGLOBIN A1C
Hgb A1c MFr Bld: 9 % — ABNORMAL HIGH (ref <5.7)
Mean Plasma Glucose: 212 mg/dL — ABNORMAL HIGH (ref <117)

## 2014-11-08 LAB — BASIC METABOLIC PANEL WITH GFR
BUN: 14 mg/dL (ref 6–23)
CHLORIDE: 99 meq/L (ref 96–112)
CO2: 26 meq/L (ref 19–32)
CREATININE: 1.12 mg/dL (ref 0.50–1.35)
Calcium: 9.6 mg/dL (ref 8.4–10.5)
GFR, EST NON AFRICAN AMERICAN: 63 mL/min
GFR, Est African American: 72 mL/min
Glucose, Bld: 135 mg/dL — ABNORMAL HIGH (ref 70–99)
POTASSIUM: 4.5 meq/L (ref 3.5–5.3)
Sodium: 135 mEq/L (ref 135–145)

## 2014-11-08 LAB — HEPATIC FUNCTION PANEL
ALBUMIN: 4.1 g/dL (ref 3.5–5.2)
ALT: 15 U/L (ref 0–53)
AST: 18 U/L (ref 0–37)
Alkaline Phosphatase: 58 U/L (ref 39–117)
BILIRUBIN TOTAL: 0.4 mg/dL (ref 0.2–1.2)
Bilirubin, Direct: 0.1 mg/dL (ref 0.0–0.3)
Indirect Bilirubin: 0.3 mg/dL (ref 0.2–1.2)
TOTAL PROTEIN: 6.8 g/dL (ref 6.0–8.3)

## 2014-11-08 LAB — LIPID PANEL
Cholesterol: 152 mg/dL (ref 0–200)
HDL: 38 mg/dL — AB (ref >=40)
LDL Cholesterol: 75 mg/dL (ref 0–99)
Total CHOL/HDL Ratio: 4 Ratio
Triglycerides: 195 mg/dL — ABNORMAL HIGH (ref <150)
VLDL: 39 mg/dL (ref 0–40)

## 2014-11-08 LAB — CBC WITH DIFFERENTIAL/PLATELET
BASOS ABS: 0.1 10*3/uL (ref 0.0–0.1)
BASOS PCT: 1 % (ref 0–1)
EOS ABS: 0.1 10*3/uL (ref 0.0–0.7)
EOS PCT: 1 % (ref 0–5)
HCT: 42.9 % (ref 39.0–52.0)
Hemoglobin: 14.3 g/dL (ref 13.0–17.0)
Lymphocytes Relative: 45 % (ref 12–46)
Lymphs Abs: 3.6 10*3/uL (ref 0.7–4.0)
MCH: 29.2 pg (ref 26.0–34.0)
MCHC: 33.3 g/dL (ref 30.0–36.0)
MCV: 87.6 fL (ref 78.0–100.0)
MPV: 10.2 fL (ref 8.6–12.4)
Monocytes Absolute: 0.6 10*3/uL (ref 0.1–1.0)
Monocytes Relative: 8 % (ref 3–12)
Neutro Abs: 3.6 10*3/uL (ref 1.7–7.7)
Neutrophils Relative %: 45 % (ref 43–77)
PLATELETS: 240 10*3/uL (ref 150–400)
RBC: 4.9 MIL/uL (ref 4.22–5.81)
RDW: 14 % (ref 11.5–15.5)
WBC: 7.9 10*3/uL (ref 4.0–10.5)

## 2014-11-08 LAB — MAGNESIUM: Magnesium: 1.8 mg/dL (ref 1.5–2.5)

## 2014-11-08 LAB — URINALYSIS, MICROSCOPIC ONLY
BACTERIA UA: NONE SEEN
Casts: NONE SEEN
Crystals: NONE SEEN
Squamous Epithelial / LPF: NONE SEEN

## 2014-11-08 LAB — TSH: TSH: 1.411 u[IU]/mL (ref 0.350–4.500)

## 2014-11-08 LAB — MICROALBUMIN / CREATININE URINE RATIO
Creatinine, Urine: 151.1 mg/dL
MICROALB/CREAT RATIO: 17.2 mg/g (ref 0.0–30.0)
Microalb, Ur: 2.6 mg/dL — ABNORMAL HIGH (ref <2.0)

## 2014-11-08 LAB — INSULIN, RANDOM: Insulin: 21.2 u[IU]/mL — ABNORMAL HIGH (ref 2.0–19.6)

## 2014-11-08 LAB — PSA: PSA: 0.15 ng/mL (ref <=4.00)

## 2014-11-08 LAB — VITAMIN D 25 HYDROXY (VIT D DEFICIENCY, FRACTURES): Vit D, 25-Hydroxy: 22 ng/mL — ABNORMAL LOW (ref 30–100)

## 2014-12-24 ENCOUNTER — Other Ambulatory Visit: Payer: Self-pay | Admitting: Physician Assistant

## 2015-01-01 ENCOUNTER — Other Ambulatory Visit: Payer: Self-pay | Admitting: Physician Assistant

## 2015-01-01 DIAGNOSIS — E119 Type 2 diabetes mellitus without complications: Secondary | ICD-10-CM

## 2015-02-15 ENCOUNTER — Ambulatory Visit (INDEPENDENT_AMBULATORY_CARE_PROVIDER_SITE_OTHER): Payer: Medicare Other | Admitting: Internal Medicine

## 2015-02-15 ENCOUNTER — Encounter: Payer: Self-pay | Admitting: Internal Medicine

## 2015-02-15 VITALS — BP 116/70 | HR 64 | Temp 98.2°F | Resp 18 | Ht 73.0 in | Wt 207.0 lb

## 2015-02-15 DIAGNOSIS — E559 Vitamin D deficiency, unspecified: Secondary | ICD-10-CM | POA: Diagnosis not present

## 2015-02-15 DIAGNOSIS — Z79899 Other long term (current) drug therapy: Secondary | ICD-10-CM | POA: Diagnosis not present

## 2015-02-15 DIAGNOSIS — E1329 Other specified diabetes mellitus with other diabetic kidney complication: Secondary | ICD-10-CM

## 2015-02-15 DIAGNOSIS — E785 Hyperlipidemia, unspecified: Secondary | ICD-10-CM

## 2015-02-15 DIAGNOSIS — R109 Unspecified abdominal pain: Secondary | ICD-10-CM | POA: Diagnosis not present

## 2015-02-15 DIAGNOSIS — I10 Essential (primary) hypertension: Secondary | ICD-10-CM | POA: Diagnosis not present

## 2015-02-15 LAB — HEPATIC FUNCTION PANEL
ALBUMIN: 4 g/dL (ref 3.6–5.1)
ALT: 18 U/L (ref 9–46)
AST: 18 U/L (ref 10–35)
Alkaline Phosphatase: 68 U/L (ref 40–115)
BILIRUBIN DIRECT: 0.1 mg/dL (ref <=0.2)
Indirect Bilirubin: 0.4 mg/dL (ref 0.2–1.2)
Total Bilirubin: 0.5 mg/dL (ref 0.2–1.2)
Total Protein: 6.9 g/dL (ref 6.1–8.1)

## 2015-02-15 LAB — CBC WITH DIFFERENTIAL/PLATELET
Basophils Absolute: 0 10*3/uL (ref 0.0–0.1)
Basophils Relative: 0 % (ref 0–1)
EOS PCT: 1 % (ref 0–5)
Eosinophils Absolute: 0.1 10*3/uL (ref 0.0–0.7)
HCT: 44.7 % (ref 39.0–52.0)
HEMOGLOBIN: 15.4 g/dL (ref 13.0–17.0)
Lymphocytes Relative: 32 % (ref 12–46)
Lymphs Abs: 3.1 10*3/uL (ref 0.7–4.0)
MCH: 30.2 pg (ref 26.0–34.0)
MCHC: 34.5 g/dL (ref 30.0–36.0)
MCV: 87.6 fL (ref 78.0–100.0)
MONOS PCT: 7 % (ref 3–12)
MPV: 9.8 fL (ref 8.6–12.4)
Monocytes Absolute: 0.7 10*3/uL (ref 0.1–1.0)
NEUTROS ABS: 5.8 10*3/uL (ref 1.7–7.7)
Neutrophils Relative %: 60 % (ref 43–77)
PLATELETS: 261 10*3/uL (ref 150–400)
RBC: 5.1 MIL/uL (ref 4.22–5.81)
RDW: 14.6 % (ref 11.5–15.5)
WBC: 9.6 10*3/uL (ref 4.0–10.5)

## 2015-02-15 LAB — BASIC METABOLIC PANEL WITH GFR
BUN: 13 mg/dL (ref 7–25)
CALCIUM: 10.6 mg/dL — AB (ref 8.6–10.3)
CO2: 27 mmol/L (ref 20–31)
Chloride: 96 mmol/L — ABNORMAL LOW (ref 98–110)
Creat: 1.07 mg/dL (ref 0.70–1.18)
GFR, Est African American: 76 mL/min (ref >=60)
GFR, Est Non African American: 66 mL/min (ref >=60)
GLUCOSE: 289 mg/dL — AB (ref 65–99)
Potassium: 4.7 mmol/L (ref 3.5–5.3)
SODIUM: 133 mmol/L — AB (ref 135–146)

## 2015-02-15 LAB — MAGNESIUM: MAGNESIUM: 1.8 mg/dL (ref 1.5–2.5)

## 2015-02-15 LAB — HEMOGLOBIN A1C
Hgb A1c MFr Bld: 9.3 % — ABNORMAL HIGH (ref <5.7)
MEAN PLASMA GLUCOSE: 220 mg/dL — AB (ref <117)

## 2015-02-15 LAB — LIPID PANEL
CHOLESTEROL: 176 mg/dL (ref 125–200)
HDL: 51 mg/dL (ref >=40)
LDL CALC: 87 mg/dL (ref <130)
Total CHOL/HDL Ratio: 3.5 Ratio (ref <=5.0)
Triglycerides: 191 mg/dL — ABNORMAL HIGH (ref <150)
VLDL: 38 mg/dL — ABNORMAL HIGH (ref <30)

## 2015-02-15 LAB — TSH: TSH: 1.979 u[IU]/mL (ref 0.350–4.500)

## 2015-02-15 NOTE — Progress Notes (Signed)
Patient ID: Isaiah Jackson, male   DOB: [birth year], 81 y.o.   MRN: [id number]  Assessment and Plan:  Hypertension:  -Continue medication -monitor blood pressure at home. -Continue DASH diet -Reminder to go to the ER if any CP, SOB, nausea, dizziness, severe HA, changes vision/speech, left arm numbness and tingling and jaw pain.  Cholesterol - Continue diet and exercise -Check cholesterol.   Diabetes without complications -Continue diet and exercise.  -Check A1C  Vitamin D Def -check level -continue medications.   Continue diet and meds as discussed. Further disposition pending results of labs. Discussed med's effects and SE's.    HPI 81 y.o. male  presents for 3 month follow up with hypertension, hyperlipidemia, diabetes and vitamin D deficiency.   His blood pressure has been controlled at home, today their BP is BP: 116/70 mmHg.He does workout. He denies chest pain, shortness of breath, dizziness.  He reports that he goes dancing several times a week.   He is on cholesterol medication and denies myalgias. His cholesterol is at goal. The cholesterol was:  11/07/2014: Cholesterol 152; HDL 38*; LDL Cholesterol 75; Triglycerides 195*   He has been working on diet and exercise for diabetes without complications, he is on bASA, he is not on ACE/ARB, and denies  foot ulcerations, hyperglycemia, hypoglycemia , increased appetite, nausea, paresthesia of the feet, polydipsia, polyuria, visual disturbances, vomiting and weight loss. Last A1C was: 11/07/2014: Hgb A1c MFr Bld 9.0*   Patient is on Vitamin D supplement. 11/07/2014: Vit D, 25-Hydroxy 22*    Current Medications:  Current Outpatient Prescriptions on File Prior to Visit  Medication Sig Dispense Refill  . aspirin 81 MG tablet Take 81 mg by mouth daily.    . Cholecalciferol (VITAMIN D-3) 1000 UNITS CAPS Take 1,000 Units by mouth daily.    . Magnesium 400 MG CAPS Take 400 mg by mouth daily.    . metFORMIN (GLUCOPHAGE) 500 MG tablet  TAKE 1 TABLET (500 MG TOTAL) BY MOUTH 2 (TWO) TIMES DAILY WITH A MEAL. 360 tablet 1  . simvastatin (ZOCOR) 40 MG tablet TAKE 1 TABLET (40 MG TOTAL) BY MOUTH DAILY. 90 tablet 1   No current facility-administered medications on file prior to visit.   Medical History:  Past Medical History  Diagnosis Date  . Hyperlipidemia   . Diabetes mellitus   . Hypertension   . Coronary artery disease     s/p PTA and stenting  . Myocardial infarction   . Benign prostatic hypertrophy     history of post catheterization  . Nephrolithiasis     history of cystoscopy  . Kidney stone   . COPD (chronic obstructive pulmonary disease)   . Vitamin D deficiency    Allergies:  Allergies  Allergen Reactions  . Capoten [Captopril]     Fatigue/depression  . Lamisil [Terbinafine Hcl] Rash     Review of Systems:  Review of Systems  Constitutional: Negative for fever, chills and malaise/fatigue.  HENT: Negative for congestion, nosebleeds and sore throat.   Respiratory: Negative for cough, shortness of breath and wheezing.   Cardiovascular: Negative for chest pain, palpitations and leg swelling.  Gastrointestinal: Negative for heartburn, diarrhea, constipation, blood in stool and melena.  Genitourinary: Negative.   Skin: Negative.   Neurological: Negative for dizziness, sensory change, loss of consciousness and headaches.  Psychiatric/Behavioral: Negative for depression. The patient is not nervous/anxious and does not have insomnia.     Family history- Review and unchanged  Social history- Review and unchanged  Physical Exam: BP 116/70 mmHg  Pulse 64  Temp(Src) 98.2 F (36.8 C) (Temporal)  Resp 18  Ht 6\' 1"  (1.854 m)  Wt 207 lb (93.895 kg)  BMI 27.32 kg/m2 Wt Readings from Last 3 Encounters:  02/15/15 207 lb (93.895 kg)  11/07/14 208 lb 3.2 oz (94.439 kg)  07/28/14 210 lb (95.255 kg)   General Appearance: Well nourished well developed, non-toxic appearing, in no apparent distress. Eyes:  PERRLA, EOMs, conjunctiva no swelling or erythema ENT/Mouth: Ear canals clear with no erythema, swelling, or discharge.  TMs normal bilaterally, oropharynx clear, moist, with no exudate.   Neck: Supple, thyroid normal, no JVD, no cervical adenopathy.  Respiratory: Respiratory effort normal, breath sounds clear A&P, no wheeze, rhonchi or rales noted.  No retractions, no accessory muscle usage Cardio: RRR with no MRGs. No noted edema.  Abdomen: Soft, + BS.  Non tender, no guarding, rebound, hernias, masses. Musculoskeletal: Full ROM, 5/5 strength, Normal gait Skin: Warm, dry without rashes, lesions, ecchymosis.  Neuro: Awake and oriented X 3, Cranial nerves intact. No cerebellar symptoms.  Psych: normal affect, Insight and Judgment appropriate.    Starlyn Skeans, PA-C 11:08 AM [facility]

## 2015-02-16 LAB — URINALYSIS, ROUTINE W REFLEX MICROSCOPIC
Bilirubin Urine: NEGATIVE
Hgb urine dipstick: NEGATIVE
KETONES UR: NEGATIVE
Leukocytes, UA: NEGATIVE
NITRITE: NEGATIVE
PROTEIN: NEGATIVE
Specific Gravity, Urine: 1.027 (ref 1.001–1.035)
pH: 5.5 (ref 5.0–8.0)

## 2015-02-16 LAB — URINALYSIS, MICROSCOPIC ONLY
Bacteria, UA: NONE SEEN [HPF]
CASTS: NONE SEEN [LPF]
Crystals: NONE SEEN [HPF]
RBC / HPF: NONE SEEN RBC/HPF (ref <=2)
Squamous Epithelial / LPF: NONE SEEN [HPF] (ref <=5)
WBC, UA: NONE SEEN WBC/HPF (ref <=5)
YEAST: NONE SEEN [HPF]

## 2015-02-16 LAB — URINE CULTURE
Colony Count: NO GROWTH
Organism ID, Bacteria: NO GROWTH

## 2015-02-16 LAB — VITAMIN D 25 HYDROXY (VIT D DEFICIENCY, FRACTURES): Vit D, 25-Hydroxy: 51 ng/mL (ref 30–100)

## 2015-02-16 LAB — INSULIN, RANDOM: Insulin: 34.2 u[IU]/mL — ABNORMAL HIGH (ref 2.0–19.6)

## 2015-03-03 ENCOUNTER — Other Ambulatory Visit: Payer: Self-pay | Admitting: *Deleted

## 2015-03-03 DIAGNOSIS — E119 Type 2 diabetes mellitus without complications: Secondary | ICD-10-CM

## 2015-03-03 MED ORDER — METFORMIN HCL 500 MG PO TABS: 1000.0000 mg | ORAL_TABLET | Freq: Two times a day (BID) | ORAL | Status: DC

## 2015-04-26 ENCOUNTER — Ambulatory Visit (INDEPENDENT_AMBULATORY_CARE_PROVIDER_SITE_OTHER): Payer: Medicare Other

## 2015-04-26 DIAGNOSIS — Z23 Encounter for immunization: Secondary | ICD-10-CM | POA: Diagnosis not present

## 2015-05-25 ENCOUNTER — Encounter: Payer: Self-pay | Admitting: Internal Medicine

## 2015-05-25 ENCOUNTER — Ambulatory Visit (INDEPENDENT_AMBULATORY_CARE_PROVIDER_SITE_OTHER): Payer: Medicare Other | Admitting: Internal Medicine

## 2015-05-25 VITALS — BP 130/72 | HR 64 | Temp 97.3°F | Resp 16 | Ht 73.5 in | Wt 206.6 lb

## 2015-05-25 DIAGNOSIS — Z6825 Body mass index (BMI) 25.0-25.9, adult: Secondary | ICD-10-CM | POA: Insufficient documentation

## 2015-05-25 DIAGNOSIS — Z6826 Body mass index (BMI) 26.0-26.9, adult: Secondary | ICD-10-CM | POA: Diagnosis not present

## 2015-05-25 DIAGNOSIS — E785 Hyperlipidemia, unspecified: Secondary | ICD-10-CM

## 2015-05-25 DIAGNOSIS — E1121 Type 2 diabetes mellitus with diabetic nephropathy: Secondary | ICD-10-CM

## 2015-05-25 DIAGNOSIS — Z79899 Other long term (current) drug therapy: Secondary | ICD-10-CM

## 2015-05-25 DIAGNOSIS — I10 Essential (primary) hypertension: Secondary | ICD-10-CM

## 2015-05-25 DIAGNOSIS — I2510 Atherosclerotic heart disease of native coronary artery without angina pectoris: Secondary | ICD-10-CM

## 2015-05-25 DIAGNOSIS — E559 Vitamin D deficiency, unspecified: Secondary | ICD-10-CM | POA: Diagnosis not present

## 2015-05-25 DIAGNOSIS — E663 Overweight: Secondary | ICD-10-CM | POA: Insufficient documentation

## 2015-05-25 LAB — CBC WITH DIFFERENTIAL/PLATELET
Basophils Absolute: 0 10*3/uL (ref 0.0–0.1)
Basophils Relative: 0 % (ref 0–1)
EOS PCT: 1 % (ref 0–5)
Eosinophils Absolute: 0.1 10*3/uL (ref 0.0–0.7)
HEMATOCRIT: 42.8 % (ref 39.0–52.0)
HEMOGLOBIN: 14.9 g/dL (ref 13.0–17.0)
LYMPHS ABS: 3.2 10*3/uL (ref 0.7–4.0)
LYMPHS PCT: 43 % (ref 12–46)
MCH: 30.8 pg (ref 26.0–34.0)
MCHC: 34.8 g/dL (ref 30.0–36.0)
MCV: 88.4 fL (ref 78.0–100.0)
MONO ABS: 0.6 10*3/uL (ref 0.1–1.0)
MONOS PCT: 8 % (ref 3–12)
MPV: 10.1 fL (ref 8.6–12.4)
NEUTROS ABS: 3.6 10*3/uL (ref 1.7–7.7)
Neutrophils Relative %: 48 % (ref 43–77)
Platelets: 255 10*3/uL (ref 150–400)
RBC: 4.84 MIL/uL (ref 4.22–5.81)
RDW: 13.5 % (ref 11.5–15.5)
WBC: 7.5 10*3/uL (ref 4.0–10.5)

## 2015-05-25 LAB — MAGNESIUM: Magnesium: 1.7 mg/dL (ref 1.5–2.5)

## 2015-05-25 LAB — HEPATIC FUNCTION PANEL
ALT: 16 U/L (ref 9–46)
AST: 17 U/L (ref 10–35)
Albumin: 4.1 g/dL (ref 3.6–5.1)
Alkaline Phosphatase: 70 U/L (ref 40–115)
BILIRUBIN INDIRECT: 0.6 mg/dL (ref 0.2–1.2)
Bilirubin, Direct: 0.2 mg/dL (ref <=0.2)
TOTAL PROTEIN: 6.6 g/dL (ref 6.1–8.1)
Total Bilirubin: 0.8 mg/dL (ref 0.2–1.2)

## 2015-05-25 LAB — LIPID PANEL
CHOLESTEROL: 156 mg/dL (ref 125–200)
HDL: 42 mg/dL (ref >=40)
LDL CALC: 80 mg/dL (ref <130)
Total CHOL/HDL Ratio: 3.7 Ratio (ref <=5.0)
Triglycerides: 172 mg/dL — ABNORMAL HIGH (ref <150)
VLDL: 34 mg/dL — AB (ref <30)

## 2015-05-25 LAB — BASIC METABOLIC PANEL WITH GFR
BUN: 13 mg/dL (ref 7–25)
CALCIUM: 9.3 mg/dL (ref 8.6–10.3)
CO2: 24 mmol/L (ref 20–31)
Chloride: 98 mmol/L (ref 98–110)
Creat: 1.01 mg/dL (ref 0.70–1.18)
GFR, EST AFRICAN AMERICAN: 81 mL/min (ref >=60)
GFR, Est Non African American: 70 mL/min (ref >=60)
GLUCOSE: 207 mg/dL — AB (ref 65–99)
Potassium: 4.5 mmol/L (ref 3.5–5.3)
Sodium: 135 mmol/L (ref 135–146)

## 2015-05-25 LAB — TSH: TSH: 2.209 u[IU]/mL (ref 0.350–4.500)

## 2015-05-25 NOTE — Progress Notes (Signed)
Patient ID: Isaiah Jackson, male   DOB: 04/17/1934, 81 y.o.   MRN: [id number]   This very nice 81 y.o. 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    Patient has a long hx/o labile HTN and has significant hx/o MI in 1994 and had PTCA in 1996 and a neg Cardiolite in 2006.  In 2007 he had stenting of an AAA.  BP has been controlled and today's BP: 130/72 mmHg. Patient has had no complaints of any cardiac type chest pain, palpitations, dyspnea/orthopnea/PND, dizziness, claudication, or dependent edema.   Hyperlipidemia is controlled with diet & meds. Patient denies myalgias or other med SE's. Last Lipids were at goal with Total Cholesterol 176; HDL 51; LDL 87; Triglycerides 191 on 02/15/2015.   Also, the patient has history of T2_NIDDM with Stage 2 CKD (GFR 76 ml/min) and has had no symptoms of reactive hypoglycemia, diabetic polys, paresthesias or visual blurring.  Patient reports CBG's average range about the 130's. Last A1c was not at goal with A1c 9.3% on 02/15/2015.    Further, the patient also has history of Vitamin D Deficiency of 33 in 2012 and supplements vitamin D without any suspected side-effects. Last vitamin D was 51 on 02/15/2015.   Medication Sig  . aspirin 81 MG tablet Take 81 mg by mouth daily.  [name] VITAMIN D 1000 UNITS CAPS Take 2,000 Units by mouth daily.   . Magnesium 400 MG CAPS Take 400 mg by mouth daily.  . metFORMIN  500 MG tablet Take 2 tablets  times daily with a meal.  . simvastatin (ZOCOR) 40 MG tablet TAKE 1 TAB DAILY.   Allergies  Allergen Reactions  . Capoten [Captopril]     Fatigue/depression  . Lamisil [Terbinafine Hcl] Rash   PMHx:   Past Medical History  Diagnosis Date  . Hyperlipidemia   . Diabetes mellitus   . Hypertension   . Coronary artery disease     s/p PTA and stenting  . Myocardial infarction ([place])   . Benign prostatic hypertrophy     history of post catheterization   . Nephrolithiasis     history of cystoscopy  . Kidney stone   . COPD (chronic obstructive pulmonary disease) ([place])   . Vitamin D deficiency    Immunization History  Administered Date(s) Administered  . Influenza Whole 06/16/2012, 03/26/2013  . Influenza, High Dose Seasonal PF 04/19/2014, 04/26/2015  . Td 05/26/2003   Past Surgical History  Procedure Laterality Date  . Abdominal aortic aneurysm repair  10/23/2005    endograft  . Cytoscopy      for kidney stones  . Shoulder surgery Right    FHx:    Reviewed / unchanged  SHx:    Reviewed / unchanged  Systems Review:  Constitutional: Denies fever, chills, wt changes, headaches, insomnia, fatigue, night sweats, change in appetite. Eyes: Denies redness, blurred vision, diplopia, discharge, itchy, watery eyes.  ENT: Denies discharge, congestion, post nasal drip, epistaxis, sore throat, earache, hearing loss, dental pain, tinnitus, vertigo, sinus pain, snoring.  CV: Denies chest pain, palpitations, irregular heartbeat, syncope, dyspnea, diaphoresis, orthopnea, PND, claudication or edema. Respiratory: denies cough, dyspnea, DOE, pleurisy, hoarseness, laryngitis, wheezing.  Gastrointestinal: Denies dysphagia, odynophagia, heartburn, reflux, water brash, abdominal pain or cramps, nausea, vomiting, bloating, diarrhea, constipation, hematemesis, melena, hematochezia  or hemorrhoids. Genitourinary: Denies dysuria, frequency, urgency, nocturia, hesitancy, discharge, hematuria or flank pain. Musculoskeletal: Denies arthralgias, myalgias, stiffness, jt. swelling, pain,  limping or strain/sprain.  Skin: Denies pruritus, rash, hives, warts, acne, eczema or change in skin lesion(s). Neuro: No weakness, tremor, incoordination, spasms, paresthesia or pain. Psychiatric: Denies confusion, memory loss or sensory loss. Endo: Denies change in weight, skin or hair change.  Heme/Lymph: No excessive bleeding, bruising or enlarged lymph nodes.  Physical  Exam  BP 130/72 mmHg  Pulse 64  Temp(Src) 97.3 F (36.3 C)  Resp 16  Ht 6' 1.5" (1.867 m)  Wt 206 lb 9.6 oz (93.713 kg)  BMI 26.89 kg/m2  Appears well nourished and in no distress. Eyes: PERRLA, EOMs, conjunctiva no swelling or erythema. Sinuses: No frontal/maxillary tenderness ENT/Mouth: EAC's clear, TM's nl w/o erythema, bulging. Nares clear w/o erythema, swelling, exudates. Oropharynx clear without erythema or exudates. Oral hygiene is good. Tongue normal, non obstructing. Hearing intact.  Neck: Supple. Thyroid nl. Car 2+/2+ without bruits, nodes or JVD. Chest: Respirations nl with BS clear & equal w/o rales, rhonchi, wheezing or stridor.  Cor: Heart sounds normal w/ regular rate and rhythm without sig. murmurs, gallops, clicks, or rubs. Peripheral pulses normal and equal  without edema.  Abdomen: Soft & bowel sounds normal. Non-tender w/o guarding, rebound, hernias, masses, or organomegaly.  Lymphatics: Unremarkable.  Musculoskeletal: Full ROM all peripheral extremities, joint stability, 5/5 strength, and normal gait.  Skin: Warm, dry without exposed rashes, lesions or ecchymosis apparent.  Neuro: Cranial nerves intact, reflexes equal bilaterally. Sensory-motor testing grossly intact. Tendon reflexes grossly intact.  Pysch: Alert & oriented x 3.  Insight and judgement nl & appropriate. No ideations.  Assessment and Plan:  1. Labile HTN  - TSH  2. T2_NIDDM w/CKD 2 (GFR 76 ml/min)  - Hemoglobin A1c - Insulin, random  3. Hyperlipidemia  - Lipid panel  4. Vitamin D deficiency  - VITAMIN D 25 Hydroxy   5. ASCAD s/p PTCA/Stents   6. BMI 26.0-26.9,adult   7. Medication management  - CBC with Differential/Platelet - BASIC METABOLIC PANEL WITH GFR - Hepatic function panel - Magnesium   Recommended regular exercise, BP monitoring, weight control, and discussed med and SE's. Recommended labs to assess and monitor clinical status. Further disposition pending results  of labs. Over 30 minutes of exam, counseling, chart review was performed

## 2015-05-25 NOTE — Patient Instructions (Signed)

## 2015-05-26 LAB — HEMOGLOBIN A1C
Hgb A1c MFr Bld: 9.3 % — ABNORMAL HIGH (ref <5.7)
MEAN PLASMA GLUCOSE: 220 mg/dL — AB (ref <117)

## 2015-05-26 LAB — INSULIN, RANDOM: Insulin: 29 u[IU]/mL — ABNORMAL HIGH (ref 2.0–19.6)

## 2015-05-26 LAB — VITAMIN D 25 HYDROXY (VIT D DEFICIENCY, FRACTURES): Vit D, 25-Hydroxy: 78 ng/mL (ref 30–100)

## 2015-06-09 ENCOUNTER — Other Ambulatory Visit: Payer: Self-pay | Admitting: Internal Medicine

## 2015-08-05 ENCOUNTER — Other Ambulatory Visit: Payer: Self-pay | Admitting: Internal Medicine

## 2015-08-28 ENCOUNTER — Encounter: Payer: Self-pay | Admitting: Physician Assistant

## 2015-08-28 ENCOUNTER — Ambulatory Visit (INDEPENDENT_AMBULATORY_CARE_PROVIDER_SITE_OTHER): Payer: Medicare Other | Admitting: Physician Assistant

## 2015-08-28 VITALS — BP 120/70 | HR 64 | Temp 98.4°F | Resp 16 | Ht 73.5 in | Wt 205.0 lb

## 2015-08-28 DIAGNOSIS — E559 Vitamin D deficiency, unspecified: Secondary | ICD-10-CM

## 2015-08-28 DIAGNOSIS — E1121 Type 2 diabetes mellitus with diabetic nephropathy: Secondary | ICD-10-CM | POA: Diagnosis not present

## 2015-08-28 DIAGNOSIS — B351 Tinea unguium: Secondary | ICD-10-CM

## 2015-08-28 DIAGNOSIS — I714 Abdominal aortic aneurysm, without rupture, unspecified: Secondary | ICD-10-CM

## 2015-08-28 DIAGNOSIS — I2510 Atherosclerotic heart disease of native coronary artery without angina pectoris: Secondary | ICD-10-CM | POA: Diagnosis not present

## 2015-08-28 DIAGNOSIS — Z0001 Encounter for general adult medical examination with abnormal findings: Secondary | ICD-10-CM

## 2015-08-28 DIAGNOSIS — J42 Unspecified chronic bronchitis: Secondary | ICD-10-CM | POA: Diagnosis not present

## 2015-08-28 DIAGNOSIS — Z0000 Encounter for general adult medical examination without abnormal findings: Secondary | ICD-10-CM

## 2015-08-28 DIAGNOSIS — Z79899 Other long term (current) drug therapy: Secondary | ICD-10-CM

## 2015-08-28 DIAGNOSIS — I10 Essential (primary) hypertension: Secondary | ICD-10-CM | POA: Diagnosis not present

## 2015-08-28 DIAGNOSIS — E1129 Type 2 diabetes mellitus with other diabetic kidney complication: Secondary | ICD-10-CM | POA: Diagnosis not present

## 2015-08-28 DIAGNOSIS — E785 Hyperlipidemia, unspecified: Secondary | ICD-10-CM | POA: Diagnosis not present

## 2015-08-28 DIAGNOSIS — R6889 Other general symptoms and signs: Secondary | ICD-10-CM

## 2015-08-28 DIAGNOSIS — Z6826 Body mass index (BMI) 26.0-26.9, adult: Secondary | ICD-10-CM

## 2015-08-28 LAB — CBC WITH DIFFERENTIAL/PLATELET
Basophils Absolute: 0.1 10*3/uL (ref 0.0–0.1)
Basophils Relative: 1 % (ref 0–1)
Eosinophils Absolute: 0.1 10*3/uL (ref 0.0–0.7)
Eosinophils Relative: 1 % (ref 0–5)
HEMATOCRIT: 43.4 % (ref 39.0–52.0)
HEMOGLOBIN: 15.1 g/dL (ref 13.0–17.0)
LYMPHS PCT: 37 % (ref 12–46)
Lymphs Abs: 2.8 10*3/uL (ref 0.7–4.0)
MCH: 30.9 pg (ref 26.0–34.0)
MCHC: 34.8 g/dL (ref 30.0–36.0)
MCV: 88.9 fL (ref 78.0–100.0)
MONO ABS: 0.5 10*3/uL (ref 0.1–1.0)
MONOS PCT: 7 % (ref 3–12)
MPV: 9.5 fL (ref 8.6–12.4)
NEUTROS ABS: 4.1 10*3/uL (ref 1.7–7.7)
Neutrophils Relative %: 54 % (ref 43–77)
Platelets: 238 10*3/uL (ref 150–400)
RBC: 4.88 MIL/uL (ref 4.22–5.81)
RDW: 13.3 % (ref 11.5–15.5)
WBC: 7.6 10*3/uL (ref 4.0–10.5)

## 2015-08-28 LAB — HEPATIC FUNCTION PANEL
ALT: 14 U/L (ref 9–46)
AST: 16 U/L (ref 10–35)
Albumin: 3.8 g/dL (ref 3.6–5.1)
Alkaline Phosphatase: 67 U/L (ref 40–115)
BILIRUBIN INDIRECT: 0.4 mg/dL (ref 0.2–1.2)
Bilirubin, Direct: 0.1 mg/dL (ref <=0.2)
TOTAL PROTEIN: 6.6 g/dL (ref 6.1–8.1)
Total Bilirubin: 0.5 mg/dL (ref 0.2–1.2)

## 2015-08-28 LAB — BASIC METABOLIC PANEL WITH GFR
BUN: 15 mg/dL (ref 7–25)
CALCIUM: 9.7 mg/dL (ref 8.6–10.3)
CHLORIDE: 98 mmol/L (ref 98–110)
CO2: 26 mmol/L (ref 20–31)
Creat: 1.19 mg/dL — ABNORMAL HIGH (ref 0.70–1.18)
GFR, EST NON AFRICAN AMERICAN: 58 mL/min — AB (ref >=60)
GFR, Est African American: 67 mL/min (ref >=60)
GLUCOSE: 212 mg/dL — AB (ref 65–99)
Potassium: 4.6 mmol/L (ref 3.5–5.3)
Sodium: 134 mmol/L — ABNORMAL LOW (ref 135–146)

## 2015-08-28 LAB — LIPID PANEL
CHOL/HDL RATIO: 3.8 ratio (ref <=5.0)
CHOLESTEROL: 164 mg/dL (ref 125–200)
HDL: 43 mg/dL (ref >=40)
LDL CALC: 66 mg/dL (ref <130)
Triglycerides: 274 mg/dL — ABNORMAL HIGH (ref <150)
VLDL: 55 mg/dL — AB (ref <30)

## 2015-08-28 LAB — HEMOGLOBIN A1C
HEMOGLOBIN A1C: 8.8 % — AB (ref <5.7)
MEAN PLASMA GLUCOSE: 206 mg/dL — AB (ref <117)

## 2015-08-28 LAB — TSH: TSH: 1.74 mIU/L (ref 0.40–4.50)

## 2015-08-28 LAB — MAGNESIUM: MAGNESIUM: 1.9 mg/dL (ref 1.5–2.5)

## 2015-08-28 MED ORDER — TERBINAFINE HCL 250 MG PO TABS: 250.0000 mg | ORAL_TABLET | Freq: Every day | ORAL | Status: AC

## 2015-08-28 NOTE — Patient Instructions (Addendum)
Okay I will send in lamisil for you to take. You can only get it from [place] or [employer] will not cover it.The lamisil is taken once a day for  months and then if can be taken for several months after for a month on it and month off it. It gets processed through you liver so we need to check your liver function at 6 weeks after taking the drug. It can take up to 6 months to a year for your toenails to get better.    Diabetes is a very complicated disease...lets simplify it.  An easy way to look at it to understand the complications is if you think of the extra sugar floating in your blood stream as glass shards floating through your blood stream.    Diabetes affects your small vessels first: 1) The glass shards (sugar) scraps down the tiny blood vessels in your eyes and lead to diabetic retinopathy, the leading cause of blindness in the [place]. Diabetes is the leading cause of newly diagnosed adult (63 to 80 years of age) blindness in the [place].  2) The glass shards scratches down the tiny vessels of your legs leading to nerve damage called neuropathy and can lead to amputations of your feet. More than 60% of all non-traumatic amputations of lower limbs occur in people with diabetes.  3) Over time the small vessels in your brain are shredded and closed off, individually this does not cause any problems but over a long period of time many of the small vessels being blocked can lead to Vascular Dementia.   4) Your kidney's are a filter system and have a "net" that keeps certain things in the body and lets bad things out. Sugar shreds this net and leads to kidney damage and eventually failure. Decreasing the sugar that is destroying the net and certain blood pressure medications can help stop or decrease progression of kidney disease. Diabetes was the primary cause of kidney failure in 44 percent of all new cases in 2011.  5) Diabetes also destroys the small vessels in your  penis that lead to erectile dysfunction. Eventually the vessels are so damaged that you may not be responsive to cialis or viagra.   Diabetes and your large vessels: Your larger vessels consist of your coronary arteries in your heart and the carotid vessels to your brain. Diabetes or even increased sugars put you at 300% increased risk of heart attack and stroke and this is why.. The sugar scrapes down your large blood vessels and your body sees this as an internal injury and tries to repair itself. Just like you get a scab on your skin, your platelets will stick to the blood vessel wall trying to heal it. This is why we have diabetics on low dose aspirin daily, this prevents the platelets from sticking and can prevent plaque formation. In addition, your body takes cholesterol and tries to shove it into the open wound. This is why we want your LDL, or bad cholesterol, below 70.   The combination of platelets and cholesterol over 5-10 years forms plaque that can break off and cause a heart attack or stroke.   PLEASE REMEMBER:  Diabetes is preventable! Up to 11 percent of complications and morbidities among individuals with type 2 diabetes can be prevented, delayed, or effectively treated and minimized with regular visits to a health professional, appropriate monitoring and medication, and a healthy diet and lifestyle.     Bad carbs also  include fruit juice, alcohol, and sweet tea. These are empty calories that do not signal to your brain that you are full.   Please remember the good carbs are still carbs which convert into sugar. So please measure them out no more than 1/2-1 cup of rice, oatmeal, pasta, and beans  Veggies are however free foods! Pile them on.   Not all fruit is created equal. Please see the list below, the fruit at the bottom is higher in sugars than the fruit at the top. Please avoid all dried fruits.     Targets for Glucose Readings: Time of Check Usual Target for Most  People  Before Meals  70-130  Two hours after meals  Less than 180  Bedtime  90-150   Why Should You Check Your Blood Glucose? -The A1C tells you how your diabetes is doing over a 3 month period.  Home blood glucose monitoring (or checking) gives you information about your diabetes on a daily basis.  You will learn how well your diabetes care plan is working and whether your blood glucose is in your target range throughout the day.   -Reviewing daily blood glucose levels will help you and your healthcare team make any needed changes to you meal plan, physical activity and medications.  Meter Supplies: - A glucose meter was provided at your visit today along with strips and lancets. The blood glucose meter strips and lancets are usually covered by health insurance. Please let us know if they are not and contact your insurance to see which meter they prefer.  How to get a good blood sample: -Wash your hands in warm water.  You do not need to use alcohol wipes. -Massage your hands. -Choose which finger you will use.  It helps to use a different finger each time to avoid soreness. -Keep you hand below your wrist when using you lancet to "prick" your finger. -Apply gentle pressure, but do NOT squeeze your finger.  Checking your blood glucose Important Reminders: -Make sure your strips are not expired.  Check the date on the bottle. -Make sure the code on bottle matches the code on your machine. -Make sure your hands are clean and dry. -Do not use the center of your finger, it is the most sensitive area.  Use a spot to the side of the center of your fingertip. -Completely fill the strip target area with blood (until it beeps) to make sure the results are accurate. -You will need a prescription to have your glucose strips and lancets covered by insurance. -There is usually an 800 number on the back of your meter for help with meter issues.  How often to check: -If you take diabetes pills or  take one injection of insulin each day, you will usually be asked to check twice a day, before breakfast and 2 hours after one meal, or as directed. -If you take several insulin injections each day, you will usually be asked to check four times a day before meals and at bedtime every day.

## 2015-08-28 NOTE — Progress Notes (Signed)
MEDICARE ANNUAL WELLNESS VISIT AND FOLLOW UP Assessment:   1. Essential hypertension - continue medications, DASH diet, exercise and monitor at home. Call if greater than 130/80.  - CBC with Differential/Platelet - BASIC METABOLIC PANEL WITH GFR - Hepatic function panel - TSH  2. Type 2 diabetes mellitus with diabetic nephropathy, without long-term current use of insulin ([place]) Discussed general issues about diabetes pathophysiology and management., Educational material distributed., Suggested low cholesterol diet., Encouraged aerobic exercise., Discussed foot care., Reminded to get yearly retinal exam. - Hemoglobin A1c  3. ASCAD s/p PTCA/Stents Continue to monitor Control blood pressure, cholesterol, glucose, increase exercise.  - Lipid panel - Hemoglobin A1c  4. Aneurysm of abdominal vessel ([place]) - need follow up Dr. Oneida Alar.  - Lipid panel - Hemoglobin A1c  5. Chronic bronchitis, unspecified chronic bronchitis type ([place]) continue meds.   6. Hyperlipidemia - Lipid panel  7. Vitamin D deficiency - VITAMIN D 25 Hydroxy (Vit-D Deficiency, Fractures)  8. Medication management - Magnesium  9. BMI 26.0-26.9,adult  10. Nail fungus - terbinafine (LAMISIL) 250 MG tablet; Take 1 tablet (250 mg total) by mouth daily. Take one daily for 3 months, need liver function lab at 6 weeks.  Dispense: 90 tablet; Refill: 0  11. Encounter for Medicare annual wellness exam     Plan:   During the course of the visit the patient was educated and counseled about appropriate screening and preventive services including:    Pneumococcal vaccine   Influenza vaccine  Td vaccine  Screening electrocardiogram  Colorectal cancer screening  Diabetes screening  Glaucoma screening  Nutrition counseling   Conditions/risks identified: BMI: Discussed weight loss, diet, and increase physical activity.  Increase physical activity: AHA recommends 150 minutes of physical activity a week.   Medications reviewed Diabetes is not at goal, ACE/ARB therapy: Yes. Urinary Incontinence is not an issue: discussed non pharmacology and pharmacology options.  Fall risk: low- discussed PT, home fall assessment, medications.    Subjective:  Isaiah Jackson is a 82 y.o. male who presents for Medicare Annual Wellness Visit and 3 month follow up for HTN, hyperlipidemia, diabetes, and vitamin D Def.  Date of last medicare wellness visit was 01/2014  His blood pressure has been controlled at home, today their BP is BP: 120/70 mmHg He does workout, walks and dances on the weekends. He denies chest pain, shortness of breath, dizziness. Has COPD via CXR but denies SOB.  Has history of MI in 1994, [place] in 2996, negative stress test 2006, has history of AAA that is being monitored.  He is on cholesterol medication and denies myalgias. His cholesterol is at goal. The cholesterol last visit was:   Lab Results  Component Value Date   CHOL 156 05/25/2015   HDL 42 05/25/2015   LDLCALC 80 05/25/2015   TRIG 172* 05/25/2015   CHOLHDL 3.7 05/25/2015  He has DMII since 2003 w/Stage II CKD.checks his sugars every morning at home runs 130's, he is on ACE, he is on ASA.[name]  He has been working on diet and exercise for diabetes, and denies paresthesia of the feet, polydipsia, polyuria and visual disturbances. Last A1C in the office was:  Lab Results  Component Value Date   HGBA1C 9.3* 05/25/2015   Patient is on Vitamin D supplement.   Lab Results  Component Value Date   VD25OH 78 05/25/2015      Names of Other Physician/Practitioners you currently use: 1. [facility] here for primary care 2.  Isaiah Jackson, eye doctor, last visit 2 years ago 3. Has false teeth/dentures Patient Care Team: Isaiah Pinto, MD as PCP - General (Internal Medicine) Isaiah Reedy, MD as Consulting Physician (Cardiology) Isaiah Dutch, MD as Consulting Physician (Vascular  Surgery) Isaiah Spates, MD as Consulting Physician (Gastroenterology) Isaiah Brownie, MD as Consulting Physician (Dermatology) Isaiah Jackson, ortho  Medication Review: Current Outpatient Prescriptions on File Prior to Visit  Medication Sig Dispense Refill  . aspirin 81 MG tablet Take 81 mg by mouth daily.    . Cholecalciferol (VITAMIN D-3) 1000 UNITS CAPS Take 2,000 Units by mouth daily.     . fenofibrate micronized (LOFIBRA) 134 MG capsule TAKE 1 CAPSULE (134 MG TOTAL) BY MOUTH DAILY BEFORE BREAKFAST. 90 capsule 1  . Magnesium 400 MG CAPS Take 400 mg by mouth daily.    . metFORMIN (GLUCOPHAGE) 500 MG tablet Take 2 tablets (1,000 mg total) by mouth 2 (two) times daily with a meal. 360 tablet 1  . simvastatin (ZOCOR) 40 MG tablet TAKE 1 TABLET (40 MG TOTAL) BY MOUTH DAILY. 90 tablet 1   No current facility-administered medications on file prior to visit.    Current Problems (verified) Patient Active Problem List   Diagnosis Date Noted  . BMI 26.0-26.9,adult 05/25/2015  . ASCAD s/p PTCA/Stents 11/07/2014  . Medication management 10/13/2013  . Type II diabetes mellitus with renal manifestations ([place]) 10/13/2013  . Hyperlipidemia   . Hypertension   . Vitamin D deficiency   . COPD (chronic obstructive pulmonary disease) ([place])   . Abdominal aneurysm without mention of rupture 11/07/2011    Screening Tests Health Maintenance  Topic Date Due  . PNA vac Low Risk Adult (1 of 2 - PCV13) 12/20/2000  . OPHTHALMOLOGY EXAM  03/17/2013  . TETANUS/TDAP  05/25/2013  . ZOSTAVAX  12/10/2017 (Originally 12/21/1995)  . FOOT EXAM  11/07/2015  . URINE MICROALBUMIN  11/07/2015  . HEMOGLOBIN A1C  11/22/2015  . INFLUENZA VACCINE  02/06/2016    Immunization History  Administered Date(s) Administered  . Influenza Whole 06/16/2012, 03/26/2013  . Influenza, High Dose Seasonal PF 04/19/2014, 04/26/2015  . Td 05/26/2003   Preventative care: Last colonoscopy: 2014 per patient, and states every 2  years CXR 2012 Carotid Doppler neg 2013 Stress test 2002  AB aorta 2014  Prior vaccinations: TD or Tdap: 2004  Influenza: 2016 Pneumococcal: 2000 Prevnar 13:  Shingles/Zostavax: declines  Allergies Allergies  Allergen Reactions  . Capoten [Captopril]     Fatigue/depression  . Lamisil [Terbinafine Hcl] Rash   Surgical history Past Surgical History  Procedure Laterality Date  . Abdominal aortic aneurysm repair  10/23/2005    endograft  . Cytoscopy      for kidney stones  . Shoulder surgery Right    Family history Family History  Problem Relation Age of Onset  . Diabetes Mother   . Heart disease Mother   . Heart attack Mother 51    mother passed as a result  . Diabetes Daughter   . Diabetes Sister    Risk Factors: Tobacco Social History  Substance Use Topics  . Smoking status: Former Smoker    Quit date: 07/08/1992  . Smokeless tobacco: None  . Alcohol Use: No   He does not smoke.  Patient is a former smoker. Are there smokers in your home (other than you)?  No  Alcohol Current alcohol use: none  Caffeine Current caffeine use: coffee 2-3 /day  Exercise Current exercise: walking and dancing  Nutrition/Diet Current diet:  in general, a "healthy" diet    Cardiac risk factors: advanced age (older than 33 for men, 37 for women), diabetes mellitus, dyslipidemia, hypertension, male gender and sedentary lifestyle.  Depression Screen (Note: if answer to either of the following is "Yes", a more complete depression screening is indicated)   Q1: Over the past two weeks, have you felt down, depressed or hopeless? No  Q2: Over the past two weeks, have you felt little interest or pleasure in doing things? No  Have you lost interest or pleasure in daily life? No  Do you often feel hopeless? No  Do you cry easily over simple problems? No  Activities of Daily Living In your present state of health, do you have any difficulty performing the following activities?:   Driving? No Managing money?  No Feeding yourself? No Getting from bed to chair? No Climbing a flight of stairs? No Preparing food and eating?: No Bathing or showering? No Getting dressed: No Getting to the toilet? No Using the toilet:No Moving around from place to place: No In the past year have you fallen or had a near fall?:No  Vision Difficulties: No  Hearing Difficulties: Yes, wears hearing aids.  Do you often ask people to speak up or repeat themselves? Yes Do you experience ringing or noises in your ears? No Do you have difficulty understanding soft or whispered voices? Yes  Cognition  Do you feel that you have a problem with memory?No  Do you often misplace items? No  Do you feel safe at home?  Yes  Advanced directives Does patient have a [place]? Yes Does patient have a Living Will? Yes   Objective:   Blood pressure 120/70, pulse 64, temperature 98.4 F (36.9 C), temperature source Temporal, resp. rate 16, height 6' 1.5" (1.867 m), weight 205 lb (92.987 kg), SpO2 95 %. Body mass index is 26.68 kg/(m^2).  General appearance: alert, no distress, WD/WN, male Cognitive Testing  Alert? Yes  Normal Appearance?Yes  Oriented to person? Yes  Place? Yes   Time? Yes  Recall of three objects?  2/3  Can perform simple calculations? Yes  Displays appropriate judgment?Yes  Can read the correct time from a watch face?Yes  HEENT: normocephalic, sclerae anicteric, TMs pearly, nares patent, no discharge or erythema, pharynx normal Oral cavity: Right posterior pharynx with 21mm white unscrapable lesion without erythema Neck: supple, no masses or adenopathy Heart: RRR, normal S1, S2, no murmurs Lungs: CTA bilaterally, no wheezes, rhonchi, or rales Abdomen: +bs, soft, non tender, non distended, no masses, no hepatomegaly, no splenomegaly Musculoskeletal: no swelling, no obvious deformity Extremities: no edema, no cyanosis, no clubbing Pulses: 2+  symmetric, upper and lower extremities, normal cap refill Neurological: alert, oriented x 3, CN2-12 intact, strength normal upper extremities and lower extremities, sensation normal throughout, DTRs 2+ throughout, no cerebellar signs, gait normal Skin: left 1st nail with thick/brittle and split down the center.  Psychiatric: normal affect, behavior normal, pleasant   Medicare Attestation I have personally reviewed: The patient's medical and social history Their use of alcohol, tobacco or illicit drugs Their current medications and supplements The patient's functional ability including ADLs,fall risks, home safety risks, cognitive, and hearing and visual impairment Diet and physical activities Evidence for depression or mood disorders  The patient's weight, height, BMI, and visual acuity have been recorded in the chart.  I have made referrals, counseling, and provided education to the patient based on review of the above and I have provided the  patient with a written personalized care plan for preventive services.     Vicie Mutters, PA-C   08/28/2015

## 2015-08-29 LAB — VITAMIN D 25 HYDROXY (VIT D DEFICIENCY, FRACTURES): VIT D 25 HYDROXY: 71 ng/mL (ref 30–100)

## 2015-08-31 ENCOUNTER — Ambulatory Visit (INDEPENDENT_AMBULATORY_CARE_PROVIDER_SITE_OTHER): Payer: Medicare Other | Admitting: Internal Medicine

## 2015-08-31 ENCOUNTER — Encounter: Payer: Self-pay | Admitting: Internal Medicine

## 2015-08-31 VITALS — BP 150/72 | HR 72 | Temp 97.3°F | Resp 16 | Ht 73.5 in | Wt 205.0 lb

## 2015-08-31 DIAGNOSIS — H8309 Labyrinthitis, unspecified ear: Secondary | ICD-10-CM | POA: Diagnosis not present

## 2015-08-31 DIAGNOSIS — N452 Orchitis: Secondary | ICD-10-CM

## 2015-08-31 MED ORDER — SULFAMETHOXAZOLE-TRIMETHOPRIM 800-160 MG PO TABS: ORAL_TABLET | ORAL | Status: DC

## 2015-08-31 MED ORDER — MECLIZINE HCL 25 MG PO TABS: 25.0000 mg | ORAL_TABLET | Freq: Three times a day (TID) | ORAL | Status: DC | PRN

## 2015-08-31 MED ORDER — PREDNISONE 20 MG PO TABS: ORAL_TABLET | ORAL | Status: DC

## 2015-08-31 NOTE — Progress Notes (Signed)
  Subjective:    Patient ID: Isaiah Jackson, male    DOB: August 30, 1933, 82 y.o.   MRN: [id number]  HPI Patient presents with a 3-4 day hx/o classic positional vertigo with spinning type vertigo and occas  associated  Nausea. He also has been having occasional aching in his R testicle. And denies hx/o injury.    Medication Sig  . aspirin 81 MG tablet Take 81 mg by mouth daily.  . Cholecalciferol (VITAMIN D-3) 1000 UNITS CAPS Take 2,000 Units by mouth daily.   . fenofibrate micronized (LOFIBRA) 134 MG capsule TAKE 1 CAPSULE (134 MG TOTAL) BY MOUTH DAILY BEFORE BREAKFAST.  . Magnesium 400 MG CAPS Take 400 mg by mouth daily.  . metFORMIN 500 MG tablet Take 2 tablets (1,000 mg total) by mouth 2 (two) times daily with a meal.  . terbinafine  250 MG tablet Take 1 tablet (250 mg total) by mouth daily. Take one daily for 3 months, need liver function lab at 6 weeks.   Allergies  Allergen Reactions  . Capoten [Captopril]     Fatigue/depression  . Lamisil [Terbinafine Hcl] Rash   Past Medical History  Diagnosis Date  . Hyperlipidemia   . Diabetes mellitus   . Hypertension   . Coronary artery disease     s/p PTA and stenting  . Myocardial infarction ([place])   . Benign prostatic hypertrophy     history of post catheterization  . Nephrolithiasis     history of cystoscopy  . Kidney stone   . COPD (chronic obstructive pulmonary disease) ([place])   . Vitamin D deficiency    Review of Systems  10 point systems review negative except as above.    Objective:   Physical Exam  BP 150/72 mmHg  Pulse 72  Temp(Src) 97.3 F (36.3 C)  Resp 16  Ht 6' 1.5" (1.867 m)  Wt 205 lb (92.987 kg)  BMI 26.68 kg/m2  HEENT - Eac's patent. TM's Nl. EOM's full. PERRLA. NasoOroPharynx clear. Neck - supple. Nl Thyroid. Carotids 2+ & No bruits, nodes, JVD Chest - Clear equal BS w/o Rales, rhonchi, wheezes. Cor - Nl HS. RRR w/o sig MGR. PP 1(+). No edema. Abd - No palpable organomegaly, masses or tenderness. BS  nl. Gu (+) tender L testis.  MS- FROM w/o deformities. Muscle power, tone and bulk Nl. Gait Nl. Neuro - No obvious Cr N abnormalities. Sensory, motor and Cerebellar functions appear Nl w/o focal abnormalities. Psyche - Mental status normal & appropriate.  No delusions, ideations or obvious mood abnormalities.    Assessment & Plan:   1. Labyrinthitis, unspecified laterality  - predniSONE (DELTASONE) 20 MG tablet; 1 tab 3 x day for 2 days, then 1 tab 2 x day for 2 days, then 1 tab 1 x day for 3 days  Dispense: 13 tablet; Refill: 0 - meclizine (ANTIVERT) 25 MG tablet; Take 1 tablet (25 mg total) by mouth 3 (three) times daily as needed for dizziness.  Dispense: 30 tablet; Refill: 0  2. Orchitis  - sulfamethoxazole-trimethoprim (BACTRIM DS,SEPTRA DS) 800-160 MG tablet; Take 1 tablet 2 x day with food for infection  Dispense: 30 tablet; Refill: 1  - ROV 2 weeks

## 2015-08-31 NOTE — Patient Instructions (Addendum)
Benign Positional Vertigo Vertigo is the feeling that you or your surroundings are moving when they are not. Benign positional vertigo is the most common form of vertigo. The cause of this condition is not serious (is benign). This condition is triggered by certain movements and positions (is positional). This condition can be dangerous if it occurs while you are doing something that could endanger you or others, such as driving.  CAUSES In many cases, the cause of this condition is not known. It may be caused by a disturbance in an area of the inner ear that helps your brain to sense movement and balance. This disturbance can be caused by a viral infection (labyrinthitis), head injury, or repetitive motion. RISK FACTORS This condition is more likely to develop in:  Women.  People who are 50 years of age or older. SYMPTOMS Symptoms of this condition usually happen when you move your head or your eyes in different directions. Symptoms may start suddenly, and they usually last for less than a minute. Symptoms may include:  Loss of balance and falling.  Feeling like you are spinning or moving.  Feeling like your surroundings are spinning or moving.  Nausea and vomiting.  Blurred vision.  Dizziness.  Involuntary eye movement (nystagmus). Symptoms can be mild and cause only slight annoyance, or they can be severe and interfere with daily life. Episodes of benign positional vertigo may return (recur) over time, and they may be triggered by certain movements. Symptoms may improve over time. DIAGNOSIS This condition is usually diagnosed by medical history and a physical exam of the head, neck, and ears. You may be referred to a health care provider who specializes in ear, nose, and throat (ENT) problems (otolaryngologist) or a provider who specializes in disorders of the nervous system (neurologist). You may have additional testing, including:  MRI.  A CT scan.  Eye movement tests. Your  health care provider may ask you to change positions quickly while he or she watches you for symptoms of benign positional vertigo, such as nystagmus. Eye movement may be tested with an electronystagmogram (ENG), caloric stimulation, the Dix-Hallpike test, or the roll test.  An electroencephalogram (EEG). This records electrical activity in your brain.  Hearing tests. TREATMENT Usually, your health care provider will treat this by moving your head in specific positions to adjust your inner ear back to normal. Surgery may be needed in severe cases, but this is rare. In some cases, benign positional vertigo may resolve on its own in 2-4 weeks. HOME CARE INSTRUCTIONS Safety  Move slowly.Avoid sudden body or head movements.  Avoid driving.  Avoid operating heavy machinery.  Avoid doing any tasks that would be dangerous to you or others if a vertigo episode would occur.  If you have trouble walking or keeping your balance, try using a cane for stability. If you feel dizzy or unstable, sit down right away.  Return to your normal activities as told by your health care provider. Ask your health care provider what activities are safe for you. General Instructions  Take over-the-counter and prescription medicines only as told by your health care provider.  Avoid certain positions or movements as told by your health care provider.  Drink enough fluid to keep your urine clear or pale yellow.  Keep all follow-up visits as told by your health care provider. This is important. SEEK MEDICAL CARE IF:  You have a fever.  Your condition gets worse or you develop new symptoms.  Your family or friends   notice any behavioral changes.  Your nausea or vomiting gets worse.  You have numbness or a "pins and needles" sensation. SEEK IMMEDIATE MEDICAL CARE IF:  You have difficulty speaking or moving.  You are always dizzy.  You faint.  You develop severe headaches.  You have weakness in your  legs or arms.  You have changes in your hearing or vision.  You develop a stiff neck.  You develop sensitivity to light.   ++++++++++++++++++++++++++++++++++++++++++++++++++  Orchitis Orchitis is swelling (inflammation) of a testicle caused by infection. Testicles are the male organs that produce sperm. The testicles are held in a fleshy sac (scrotum) located behind the penis. Orchitis usually affects only one testicle, but it can occur in both. The condition can develop suddenly. Orchitis can be caused by many different kinds of bacteria and viruses. CAUSES Orchitis can be caused by either a bacterial or viral infection. Bacterial Infections  These often occur along with an infection of the coiled tube that collects sperm and sits on top of the testicle (epididymis).  In men who are not sexually active, bacterial orchitis usually starts as a urinary tract infection and spreads to the testicle.   Viral Infections  Mumps is still the most common cause of viral orchitis, though mumps is now rare in many areas because of vaccination.  Other viruses that can cause orchitis include:  The chickenpox virus (varicella-zoster virus).  The virus that causes mononucleosis (Epstein-Barr virus). RISK FACTORS Boys and men who have not been vaccinated against mumps are at risk for mumps orchitis. Risk factors for bacterial orchitis include:  Frequent urinary tract infections.  High-risk sexual behaviors.  Having a sexual partner with a sexually transmitted infection.  Having had urinary tract surgery.  Using a tube passed through the penis to drain urine (Foley catheter).  An enlarged prostate gland. SIGNS AND SYMPTOMS The most common symptoms of orchitis are swelling and pain in the scrotum. Other signs and symptoms may include:  Feeling generally sick (malaise).  Fever and chills.  Painful urination.  Painful ejaculation.  Blood or discharge from the  penis.  Nausea.  Headache.  Fatigue. DIAGNOSIS Your health care provider may suspect orchitis if you have a painful, swollen testicle along with other signs and symptoms of the condition. A physical exam will be done. Tests may also be done to help your health care provider make a diagnosis. These may include:  A blood test to check for signs of infection.  A urine test to check for a urinary tract infection.  Using a swab to collect a fluid sample from the tip of the penis to test for sexually transmitted infections.  Taking an image of the testicle using sound waves and a computer (testicular ultrasound). TREATMENT Treatment of orchitis depends on the cause. For orchitis caused by a bacterial infection, your health care provider will most likely prescribe antibiotic medicines. Bacterial infections usually clear up within a few days. Both viral infections and bacterial infections may be treated with:  Bed rest.  Anti-inflammatory medicines.  Pain medicines.  Elevating the scrotum and applying ice. HOME CARE INSTRUCTIONS  Rest as directed by your health care provider.  Take medicines only as directed by your health care provider.  If you were prescribed an antibiotic medicine, finish it all even if you start to feel better.  Elevate your scrotum and apply ice as directed:  Put ice in a plastic bag.  Place a small towel or pillow between your legs.  Rest  your scrotum on the pillow or towel.  Place another towel between your skin and the plastic bag.  Leave the ice on for 20 minutes, 2-3 times a day. SEEK MEDICAL CARE IF:  You have a fever.  Pain and swelling have not gotten better after 3 days. SEEK IMMEDIATE MEDICAL CARE IF:  Your pain is getting worse.  The swelling in your testicle gets worse.

## 2015-09-01 DIAGNOSIS — L570 Actinic keratosis: Secondary | ICD-10-CM | POA: Diagnosis not present

## 2015-09-01 DIAGNOSIS — L812 Freckles: Secondary | ICD-10-CM | POA: Diagnosis not present

## 2015-09-01 DIAGNOSIS — D225 Melanocytic nevi of trunk: Secondary | ICD-10-CM | POA: Diagnosis not present

## 2015-09-01 DIAGNOSIS — L821 Other seborrheic keratosis: Secondary | ICD-10-CM | POA: Diagnosis not present

## 2015-09-14 ENCOUNTER — Ambulatory Visit (INDEPENDENT_AMBULATORY_CARE_PROVIDER_SITE_OTHER): Payer: Medicare Other | Admitting: Internal Medicine

## 2015-09-14 ENCOUNTER — Encounter: Payer: Self-pay | Admitting: Internal Medicine

## 2015-09-14 VITALS — BP 132/70 | HR 60 | Temp 97.3°F | Resp 16 | Ht 73.5 in | Wt 205.2 lb

## 2015-09-14 DIAGNOSIS — H6506 Acute serous otitis media, recurrent, bilateral: Secondary | ICD-10-CM | POA: Diagnosis not present

## 2015-09-14 NOTE — Progress Notes (Signed)
  Subjective:    Patient ID: Isaiah Jackson, male    DOB: Mar 15, 1934, 82 y.o.   MRN: [id number]  HPI  Patient returns for a 2 week f/u of positional vertigo treated with Prednisone pulse/taper and Meclizine and is improved with only fleeting or transient mild vertigo when 1st arising from bed in the morning. He also ha d mild L orchitis treated with SXT- DS and this like wise has resolved. He does report head congestion with intermittent rushing sounds and tinnitus in his ears and mild dampening of hearing improved with nasal insufflation. Denies sinus drainage.   Medication Sig  . aspirin 81 MG tablet Take 81 mg by mouth daily.  [name] VITAMIN D 1000 UNITS  Take 2,000 Units by mouth daily.   . fenofibrate  134 MG capsule TAKE 1 CAPSULE (134 MG TOTAL) BY MOUTH DAILY BEFORE BREAKFAST.  . Magnesium 400 MG CAPS Take 400 mg by mouth daily.  . meclizine25 MG tablet Take 1 tablet (25 mg total) by mouth 3 (three) times daily as needed for dizziness.  . metFORMIN  500 MG tablet Take 2 tablets (1,000 mg total) by mouth 2 (two) times daily with a meal.  . simvastatin  40 MG tablet TAKE 1 TABLET (40 MG TOTAL) BY MOUTH DAILY.   Allergies  Allergen Reactions  . Capoten [Captopril]     Fatigue/depression  . Lamisil [Terbinafine Hcl] Rash   Past Medical History  Diagnosis Date  . Hyperlipidemia   . Diabetes mellitus   . Hypertension   . Coronary artery disease     s/p PTA and stenting  . Myocardial infarction ([place])   . Benign prostatic hypertrophy     history of post catheterization  . Nephrolithiasis     history of cystoscopy  . Kidney stone   . COPD (chronic obstructive pulmonary disease) ([place])   . Vitamin D deficiency    Review of Systems  10 point systems review negative except as above.    Objective:   Physical Exam  BP 132/70 mmHg  Pulse 60  Temp(Src) 97.3 F (36.3 C)  Resp 16  Ht 6' 1.5" (1.867 m)  Wt 205 lb 3.2 oz (93.078 kg)  BMI 26.70 kg/m2   Postural BP's - sitting 130/70 w/P  66 and standing 132/70 w/P 73  HEENT - Eac's patent. TM's retracted 2+. EOM's full. PERRLA. NasoOroPharynx clear. Neck - supple. Nl Thyroid. Carotids 2+ & No bruits, nodes, JVD Chest - Clear equal BS w/o Rales, rhonchi, wheezes. Cor - Nl HS. RRR w/o sig MGR. PP 1(+). No edema.  MS- WNL Neuro - No obvious Cr N abnormalities.  Nl w/o focal abnormalities. Psyche - Mental status normal & appropriate.  No delusions, ideations or obvious mood abnormalities.    Assessment & Plan:   1. Recurrent serous otitis media of both ears  - Recc OTC Flonase bid, OTC Phenylephrine

## 2015-09-15 ENCOUNTER — Other Ambulatory Visit: Payer: Self-pay | Admitting: *Deleted

## 2015-09-15 DIAGNOSIS — E119 Type 2 diabetes mellitus without complications: Secondary | ICD-10-CM

## 2015-09-15 MED ORDER — METFORMIN HCL 500 MG PO TABS: 1000.0000 mg | ORAL_TABLET | Freq: Two times a day (BID) | ORAL | Status: DC

## 2015-09-30 ENCOUNTER — Other Ambulatory Visit: Payer: Self-pay | Admitting: Internal Medicine

## 2015-10-09 ENCOUNTER — Ambulatory Visit (INDEPENDENT_AMBULATORY_CARE_PROVIDER_SITE_OTHER): Payer: Medicare Other

## 2015-10-09 DIAGNOSIS — Z79899 Other long term (current) drug therapy: Secondary | ICD-10-CM

## 2015-10-09 LAB — HEPATIC FUNCTION PANEL
ALBUMIN: 3.9 g/dL (ref 3.6–5.1)
ALK PHOS: 72 U/L (ref 40–115)
ALT: 16 U/L (ref 9–46)
AST: 17 U/L (ref 10–35)
Bilirubin, Direct: 0.1 mg/dL (ref <=0.2)
Indirect Bilirubin: 0.5 mg/dL (ref 0.2–1.2)
TOTAL PROTEIN: 6.1 g/dL (ref 6.1–8.1)
Total Bilirubin: 0.6 mg/dL (ref 0.2–1.2)

## 2015-10-09 LAB — BASIC METABOLIC PANEL
BUN: 14 mg/dL (ref 7–25)
CALCIUM: 9.2 mg/dL (ref 8.6–10.3)
CHLORIDE: 99 mmol/L (ref 98–110)
CO2: 26 mmol/L (ref 20–31)
Creat: 1.01 mg/dL (ref 0.70–1.18)
GLUCOSE: 231 mg/dL — AB (ref 65–99)
Potassium: 4.4 mmol/L (ref 3.5–5.3)
SODIUM: 135 mmol/L (ref 135–146)

## 2015-11-21 ENCOUNTER — Encounter: Payer: Self-pay | Admitting: Internal Medicine

## 2015-12-27 ENCOUNTER — Encounter: Payer: Self-pay | Admitting: Internal Medicine

## 2015-12-27 ENCOUNTER — Other Ambulatory Visit: Payer: Self-pay | Admitting: *Deleted

## 2015-12-27 ENCOUNTER — Ambulatory Visit (INDEPENDENT_AMBULATORY_CARE_PROVIDER_SITE_OTHER): Payer: Medicare Other | Admitting: Internal Medicine

## 2015-12-27 VITALS — BP 136/80 | HR 64 | Temp 97.6°F | Resp 16 | Ht 72.5 in | Wt 202.8 lb

## 2015-12-27 DIAGNOSIS — I714 Abdominal aortic aneurysm, without rupture, unspecified: Secondary | ICD-10-CM

## 2015-12-27 DIAGNOSIS — I2510 Atherosclerotic heart disease of native coronary artery without angina pectoris: Secondary | ICD-10-CM | POA: Diagnosis not present

## 2015-12-27 DIAGNOSIS — E1129 Type 2 diabetes mellitus with other diabetic kidney complication: Secondary | ICD-10-CM | POA: Diagnosis not present

## 2015-12-27 DIAGNOSIS — Z125 Encounter for screening for malignant neoplasm of prostate: Secondary | ICD-10-CM

## 2015-12-27 DIAGNOSIS — I10 Essential (primary) hypertension: Secondary | ICD-10-CM

## 2015-12-27 DIAGNOSIS — Z136 Encounter for screening for cardiovascular disorders: Secondary | ICD-10-CM

## 2015-12-27 DIAGNOSIS — Z1212 Encounter for screening for malignant neoplasm of rectum: Secondary | ICD-10-CM

## 2015-12-27 DIAGNOSIS — R6889 Other general symptoms and signs: Secondary | ICD-10-CM | POA: Diagnosis not present

## 2015-12-27 DIAGNOSIS — N183 Chronic kidney disease, stage 3 (moderate): Secondary | ICD-10-CM

## 2015-12-27 DIAGNOSIS — E559 Vitamin D deficiency, unspecified: Secondary | ICD-10-CM | POA: Diagnosis not present

## 2015-12-27 DIAGNOSIS — E1122 Type 2 diabetes mellitus with diabetic chronic kidney disease: Secondary | ICD-10-CM

## 2015-12-27 DIAGNOSIS — Z79899 Other long term (current) drug therapy: Secondary | ICD-10-CM | POA: Diagnosis not present

## 2015-12-27 DIAGNOSIS — Z0001 Encounter for general adult medical examination with abnormal findings: Secondary | ICD-10-CM | POA: Diagnosis not present

## 2015-12-27 DIAGNOSIS — E785 Hyperlipidemia, unspecified: Secondary | ICD-10-CM | POA: Diagnosis not present

## 2015-12-27 LAB — CBC WITH DIFFERENTIAL/PLATELET
BASOS PCT: 1 %
Basophils Absolute: 67 cells/uL (ref 0–200)
EOS ABS: 134 {cells}/uL (ref 15–500)
Eosinophils Relative: 2 %
HCT: 42.3 % (ref 38.5–50.0)
Hemoglobin: 14.3 g/dL (ref 13.2–17.1)
LYMPHS PCT: 38 %
Lymphs Abs: 2546 cells/uL (ref 850–3900)
MCH: 30.6 pg (ref 27.0–33.0)
MCHC: 33.8 g/dL (ref 32.0–36.0)
MCV: 90.6 fL (ref 80.0–100.0)
MONOS PCT: 8 %
MPV: 10 fL (ref 7.5–12.5)
Monocytes Absolute: 536 cells/uL (ref 200–950)
Neutro Abs: 3417 cells/uL (ref 1500–7800)
Neutrophils Relative %: 51 %
PLATELETS: 237 10*3/uL (ref 140–400)
RBC: 4.67 MIL/uL (ref 4.20–5.80)
RDW: 13.5 % (ref 11.0–15.0)
WBC: 6.7 10*3/uL (ref 3.8–10.8)

## 2015-12-27 LAB — BASIC METABOLIC PANEL WITH GFR
BUN: 12 mg/dL (ref 7–25)
CO2: 26 mmol/L (ref 20–31)
CREATININE: 1.08 mg/dL (ref 0.70–1.11)
Calcium: 9.7 mg/dL (ref 8.6–10.3)
Chloride: 101 mmol/L (ref 98–110)
GFR, EST AFRICAN AMERICAN: 75 mL/min (ref >=60)
GFR, Est Non African American: 64 mL/min (ref >=60)
Glucose, Bld: 177 mg/dL — ABNORMAL HIGH (ref 65–99)
POTASSIUM: 4.4 mmol/L (ref 3.5–5.3)
Sodium: 137 mmol/L (ref 135–146)

## 2015-12-27 LAB — HEPATIC FUNCTION PANEL
ALBUMIN: 4 g/dL (ref 3.6–5.1)
ALK PHOS: 64 U/L (ref 40–115)
ALT: 14 U/L (ref 9–46)
AST: 17 U/L (ref 10–35)
BILIRUBIN TOTAL: 0.7 mg/dL (ref 0.2–1.2)
Bilirubin, Direct: 0.2 mg/dL (ref <=0.2)
Indirect Bilirubin: 0.5 mg/dL (ref 0.2–1.2)
TOTAL PROTEIN: 6.6 g/dL (ref 6.1–8.1)

## 2015-12-27 LAB — LIPID PANEL
Cholesterol: 130 mg/dL (ref 125–200)
HDL: 47 mg/dL (ref >=40)
LDL CALC: 59 mg/dL (ref <130)
Total CHOL/HDL Ratio: 2.8 Ratio (ref <=5.0)
Triglycerides: 121 mg/dL (ref <150)
VLDL: 24 mg/dL (ref <30)

## 2015-12-27 LAB — MAGNESIUM: MAGNESIUM: 1.7 mg/dL (ref 1.5–2.5)

## 2015-12-27 LAB — TSH: TSH: 2.01 m[IU]/L (ref 0.40–4.50)

## 2015-12-27 MED ORDER — SIMVASTATIN 40 MG PO TABS: ORAL_TABLET | ORAL | Status: DC

## 2015-12-27 NOTE — Progress Notes (Deleted)
   Subjective:    Patient ID: Isaiah Jackson, male    DOB: 1933-10-07, 82 y.o.   MRN: [id number]  HPI    Review of Systems     Objective:   Physical Exam        Assessment & Plan:

## 2015-12-27 NOTE — Progress Notes (Signed)
Patient ID: Isaiah Jackson, male   DOB: 1933/11/24, 82 y.o.   MRN: [id number]  [facility] AD[facility] & ADOLESCENT INTERNAL MEDICINE   [name], M.D.    [name]. Silverio Lay, P.A.-C      Starlyn Skeans, P.A.-C   [facility]                [address] [place], [place] [id number] Telephone [phone] Telefax [phone] _________________________________  Annual  Screening/Preventative Visit And Comprehensive Evaluation & Examination     This very nice 82 y.o. WWM presents for a Wellness/Preventative Visit & comprehensive evaluation and management of multiple medical co-morbidities.  Patient has been followed for HTN, T2_NIDDM  , Hyperlipidemia and Vitamin D Deficiency.     HTN predates since 39. Patient's BP has been controlled at home.Today's BP is 136/80 mmHg. Patient does have ASCAD and had PTCA in 1996 and in 2006 he had a negative Cardiolite.  Patient denies any cardiac symptoms as chest pain, palpitations, shortness of breath, dizziness or ankle swelling.     Patient's hyperlipidemia is controlled with diet and medications. Patient denies myalgias or other medication SE's. Last lipids were at goal with Cholesterol 164; HDL 43; LDL 66; but with elevated Triglycerides 274 on 08/28/2015.     Patient has T2_NIDDM since 2003 and reports CBG's usually run about 130's.  Patient denies reactive hypoglycemic symptoms, visual blurring, diabetic polys or paresthesias. Last A1c was not a goal to admitted poor diet with A1c  8.8% on 08/28/2015.      Finally, patient has history of Vitamin D Deficiency of "69" in 2012 and last vitamin D was 71 on 08/28/2015.    Medication Sig  . aspirin 81 MG tablet Take 81 mg by mouth daily.  . Cholecalciferol (VITAMIN D-3) 1000 UNITS CAPS Take 2,000 Units by mouth daily.   . fenofibrate micronized  134 MG capsule TAKE 1 CAPSULE (134 MG TOTAL) BY MOUTH DAILY BEFORE BREAKFAST.  . Magnesium 400 MG CAPS Take 400 mg  by mouth daily.  . meclizine  25 MG tablet Take 1 tablet (25 mg total) by mouth 3 (three) times daily as needed for dizziness.  . metFORMIN  500 MG tablet TAKE 2 TABLETS (1,000 MG TOTAL) BY MOUTH 2 (TWO) TIMES DAILY WITH A MEAL.  . simvastatin (ZOCOR) 40 MG tablet TAKE 1 TABLET (40 MG TOTAL) BY MOUTH DAILY.   Allergies  Allergen Reactions  . Capoten [Captopril]     Fatigue/depression  . Lamisil [Terbinafine Hcl] Rash   Past Medical History  Diagnosis Date  . fra Hyperlipidemia   . Diabetes mellitus   . Hypertension   . Coronary artery disease     s/p PTA and stenting  . Myocardial infarction ([place])   . Benign prostatic hypertrophy     history of post catheterization  . Nephrolithiasis     history of cystoscopy  . Kidney stone   . COPD (chronic obstructive pulmonary disease) ([place])   . Vitamin D deficiency    Health Maintenance  Topic Date Due  . PNA vac Low Risk Adult (1 of 2 - PCV13) 12/20/2000  . OPHTHALMOLOGY EXAM  03/17/2013  . TETANUS/TDAP  05/25/2013  . FOOT EXAM  11/07/2015  . URINE MICROALBUMIN  11/07/2015  . ZOSTAVAX  12/10/2017 (Originally 12/21/1995)  . INFLUENZA VACCINE  02/06/2016  . HEMOGLOBIN A1C  02/25/2016   Immunization History  Administered  Date(s) Administered  . Influenza Whole 06/16/2012, 03/26/2013  . Influenza, High Dose Seasonal PF 04/19/2014, 04/26/2015  . Td 05/26/2003   Past Surgical History  Procedure Laterality Date  . Abdominal aortic aneurysm repair  10/23/2005    endograft  . Cytoscopy      for kidney stones  . Shoulder surgery Right    Social History   Social History  . Marital Status: Married    Spouse Name: N/A  . Number of Children: N/A  . Years of Education: N/A   Occupational History  . Retired Engineer, maintenance (IT)   Social History Main Topics  . Smoking status: Former Smoker    Quit date: 07/08/1992  . Smokeless tobacco: Not on file  . Alcohol Use: No  . Drug Use: No  . Sexual Activity: Not on file     ROS Constitutional: Denies fever, chills, weight loss/gain, headaches, insomnia,  night sweats or change in appetite. Does c/o fatigue. Eyes: Denies redness, blurred vision, diplopia, discharge, itchy or watery eyes.  ENT: Denies discharge, congestion, post nasal drip, epistaxis, sore throat, earache, hearing loss, dental pain, Tinnitus, Vertigo, Sinus pain or snoring.  Cardio: Denies chest pain, palpitations, irregular heartbeat, syncope, dyspnea, diaphoresis, orthopnea, PND, claudication or edema Respiratory: denies cough, dyspnea, DOE, pleurisy, hoarseness, laryngitis or wheezing.  Gastrointestinal: Denies dysphagia, heartburn, reflux, water brash, pain, cramps, nausea, vomiting, bloating, diarrhea, constipation, hematemesis, melena, hematochezia, jaundice or hemorrhoids Genitourinary: Denies dysuria, frequency, urgency, nocturia, hesitancy, discharge, hematuria or flank pain Musculoskeletal: Denies arthralgia, myalgia, stiffness, Jt. Swelling, pain, limp or strain/sprain. Denies Falls. Skin: Denies puritis, rash, hives, warts, acne, eczema or change in skin lesion Neuro: No weakness, tremor, incoordination, spasms, paresthesia or pain Psychiatric: Denies confusion, memory loss or sensory loss. Denies Depression. Endocrine: Denies change in weight, skin, hair change, nocturia, and paresthesia, diabetic polys, visual blurring or hyper / hypo glycemic episodes.  Heme/Lymph: No excessive bleeding, bruising or enlarged lymph nodes.  Physical Exam  BP 136/80 mmHg  Pulse 64  Temp(Src) 97.6 F (36.4 C)  Resp 16  Ht 6' 0.5" (1.842 m)  Wt 202 lb 12.8 oz (91.989 kg)  BMI 27.11 kg/m2  General Appearance: Well nourished, in no apparent distress. Eyes: PERRLA, EOMs, conjunctiva no swelling or erythema, normal fundi and vessels. Sinuses: No frontal/maxillary tenderness ENT/Mouth: EACs patent / TMs  nl. Nares clear without erythema, swelling, mucoid exudates. Oral hygiene is good. No erythema,  swelling, or exudate. Tongue normal, non-obstructing. Tonsils not swollen or erythematous. Hearing normal.  Neck: Supple, thyroid normal. No bruits, nodes or JVD. Respiratory: Respiratory effort normal.  BS equal and clear bilateral without rales, rhonci, wheezing or stridor. Cardio: Heart sounds are normal with regular rate and rhythm and no murmurs, rubs or gallops. Peripheral pulses are normal and equal bilaterally without edema. No aortic or femoral bruits. Chest: symmetric with normal excursions and percussion.  Abdomen: Soft, with Nl bowel sounds. Nontender, no guarding, rebound, hernias, masses, or organomegaly.  Lymphatics: Non tender without lymphadenopathy.  Genitourinary: deferred for age  Musculoskeletal: Full ROM all peripheral extremities, joint stability, 5/5 strength, and normal gait. Skin: Warm and dry without rashes, lesions, cyanosis, clubbing or  ecchymosis.  Neuro: Cranial nerves intact, reflexes equal bilaterally. Normal muscle tone, no cerebellar symptoms. Sensation intact to touch , vibratory and monofilament testing to the toes bilaterally.  Pysch: Alert and oriented X 3 with normal affect, insight and judgment appropriate.   Assessment and Plan  1. Annual Preventative/Screening Exam   2. Essential hypertension  -  EKG 12-Lead - [place], RETROPERITNL ABD,  LTD - TSH  3. Hyperlipidemia  - Lipid panel - TSH  4. Type 2 diabetes mellitus with stage 3 chronic kidney disease, without long-term current use of insulin (HCC)  - Microalbumin / creatinine urine ratio - HM DIABETES FOOT EXAM - LOW EXTREMITY NEUR EXAM DOCUM - Hemoglobin A1c - Insulin, random  5. Vitamin D deficiency  - VITAMIN D 25 Hydroxy   6. ASCAD s/p PTCA/Stents   7. Aneurysm of abdominal vessel (HCC)  - [place], RETROPERITNL ABD,  LTD  8. Screening for rectal cancer  - POC Hemoccult Bld/Stl  9. Prostate cancer screening  - PSA  10. Medication management  - Urinalysis, Routine w reflex  microscopic - CBC with Differential/Platelet - BASIC METABOLIC PANEL WITH GFR - Hepatic function panel - Magnesium  11. Screening for AAA (aortic abdominal aneurysm)   12. Screening for ischemic heart disease   Continue prudent diet as discussed, weight control, BP monitoring, regular exercise, and medications as discussed.  Discussed med effects and SE's. Routine screening labs and tests as requested with regular follow-up as recommended. Over 40 minutes of exam, counseling, chart review and high complex critical decision making was performed

## 2015-12-27 NOTE — Patient Instructions (Signed)

## 2015-12-28 LAB — URINALYSIS, ROUTINE W REFLEX MICROSCOPIC
Bilirubin Urine: NEGATIVE
HGB URINE DIPSTICK: NEGATIVE
Ketones, ur: NEGATIVE
LEUKOCYTES UA: NEGATIVE
NITRITE: NEGATIVE
PROTEIN: NEGATIVE
Specific Gravity, Urine: 1.019 (ref 1.001–1.035)
pH: 6.5 (ref 5.0–8.0)

## 2015-12-28 LAB — MICROALBUMIN / CREATININE URINE RATIO
CREATININE, URINE: 104 mg/dL (ref 20–370)
Microalb Creat Ratio: 12 mcg/mg creat (ref <30)
Microalb, Ur: 1.2 mg/dL

## 2015-12-28 LAB — PSA: PSA: 0.14 ng/mL (ref <=4.00)

## 2015-12-28 LAB — INSULIN, RANDOM: INSULIN: 21 u[IU]/mL — AB (ref 2.0–19.6)

## 2015-12-28 LAB — VITAMIN D 25 HYDROXY (VIT D DEFICIENCY, FRACTURES): VIT D 25 HYDROXY: 55 ng/mL (ref 30–100)

## 2015-12-28 LAB — HEMOGLOBIN A1C
HEMOGLOBIN A1C: 8.2 % — AB (ref <5.7)
Mean Plasma Glucose: 189 mg/dL

## 2016-03-25 ENCOUNTER — Other Ambulatory Visit: Payer: Self-pay | Admitting: Internal Medicine

## 2016-03-25 DIAGNOSIS — E119 Type 2 diabetes mellitus without complications: Secondary | ICD-10-CM

## 2016-03-28 ENCOUNTER — Ambulatory Visit (INDEPENDENT_AMBULATORY_CARE_PROVIDER_SITE_OTHER): Payer: Medicare Other | Admitting: Internal Medicine

## 2016-03-28 ENCOUNTER — Encounter: Payer: Self-pay | Admitting: Internal Medicine

## 2016-03-28 VITALS — BP 108/64 | HR 62 | Temp 98.2°F | Resp 14 | Ht 72.5 in | Wt 200.0 lb

## 2016-03-28 DIAGNOSIS — Z79899 Other long term (current) drug therapy: Secondary | ICD-10-CM | POA: Diagnosis not present

## 2016-03-28 DIAGNOSIS — E559 Vitamin D deficiency, unspecified: Secondary | ICD-10-CM | POA: Diagnosis not present

## 2016-03-28 DIAGNOSIS — E785 Hyperlipidemia, unspecified: Secondary | ICD-10-CM

## 2016-03-28 DIAGNOSIS — E1122 Type 2 diabetes mellitus with diabetic chronic kidney disease: Secondary | ICD-10-CM

## 2016-03-28 DIAGNOSIS — N183 Chronic kidney disease, stage 3 unspecified: Secondary | ICD-10-CM

## 2016-03-28 DIAGNOSIS — I10 Essential (primary) hypertension: Secondary | ICD-10-CM | POA: Diagnosis not present

## 2016-03-28 LAB — CBC WITH DIFFERENTIAL/PLATELET
BASOS ABS: 96 {cells}/uL (ref 0–200)
Basophils Relative: 1 %
EOS ABS: 96 {cells}/uL (ref 15–500)
Eosinophils Relative: 1 %
HCT: 44 % (ref 38.5–50.0)
Hemoglobin: 14.9 g/dL (ref 13.2–17.1)
LYMPHS PCT: 32 %
Lymphs Abs: 3072 cells/uL (ref 850–3900)
MCH: 30.3 pg (ref 27.0–33.0)
MCHC: 33.9 g/dL (ref 32.0–36.0)
MCV: 89.6 fL (ref 80.0–100.0)
MONOS PCT: 8 %
MPV: 10.2 fL (ref 7.5–12.5)
Monocytes Absolute: 768 cells/uL (ref 200–950)
Neutro Abs: 5568 cells/uL (ref 1500–7800)
Neutrophils Relative %: 58 %
PLATELETS: 259 10*3/uL (ref 140–400)
RBC: 4.91 MIL/uL (ref 4.20–5.80)
RDW: 13.5 % (ref 11.0–15.0)
WBC: 9.6 10*3/uL (ref 3.8–10.8)

## 2016-03-28 LAB — LIPID PANEL
CHOL/HDL RATIO: 3.5 ratio (ref <=5.0)
CHOLESTEROL: 183 mg/dL (ref 125–200)
HDL: 52 mg/dL (ref >=40)
LDL Cholesterol: 101 mg/dL (ref <130)
TRIGLYCERIDES: 149 mg/dL (ref <150)
VLDL: 30 mg/dL (ref <30)

## 2016-03-28 LAB — BASIC METABOLIC PANEL WITH GFR
BUN: 15 mg/dL (ref 7–25)
CO2: 27 mmol/L (ref 20–31)
Calcium: 9.9 mg/dL (ref 8.6–10.3)
Chloride: 99 mmol/L (ref 98–110)
Creat: 1.18 mg/dL — ABNORMAL HIGH (ref 0.70–1.11)
GFR, EST AFRICAN AMERICAN: 67 mL/min (ref >=60)
GFR, EST NON AFRICAN AMERICAN: 58 mL/min — AB (ref >=60)
Glucose, Bld: 221 mg/dL — ABNORMAL HIGH (ref 65–99)
Potassium: 5 mmol/L (ref 3.5–5.3)
Sodium: 134 mmol/L — ABNORMAL LOW (ref 135–146)

## 2016-03-28 LAB — HEPATIC FUNCTION PANEL
ALBUMIN: 4.3 g/dL (ref 3.6–5.1)
ALT: 19 U/L (ref 9–46)
AST: 21 U/L (ref 10–35)
Alkaline Phosphatase: 74 U/L (ref 40–115)
Bilirubin, Direct: 0.2 mg/dL (ref <=0.2)
Indirect Bilirubin: 0.5 mg/dL (ref 0.2–1.2)
TOTAL PROTEIN: 6.8 g/dL (ref 6.1–8.1)
Total Bilirubin: 0.7 mg/dL (ref 0.2–1.2)

## 2016-03-28 LAB — TSH: TSH: 1.69 mIU/L (ref 0.40–4.50)

## 2016-03-28 MED ORDER — METFORMIN HCL ER 500 MG PO TB24: ORAL_TABLET | ORAL | 0 refills | Status: DC

## 2016-03-28 NOTE — Progress Notes (Signed)
Assessment and Plan:  Hypertension:  -Continue medication,  -monitor blood pressure at home.  -Continue DASH diet.   -Reminder to go to the ER if any CP, SOB, nausea, dizziness, severe HA, changes vision/speech, left arm numbness and tingling, and jaw pain.  Cholesterol: -Continue diet and exercise.  -Check cholesterol.   Diabetes w/ history of ASCVD: -change to metformin xr due to diarrhea -if no relief consider changing to glipizide -Continue diet and exercise.  -Check A1C  Vitamin D Def: -continue medications.   Continue diet and meds as discussed. Further disposition pending results of labs.  HPI 82 y.o. male  presents for 3 month follow up with hypertension, hyperlipidemia, prediabetes and vitamin D.   His blood pressure has been controlled at home, today their BP is BP: 108/64.   He does workout. He denies chest pain, shortness of breath, dizziness.   He is on cholesterol medication and denies myalgias. His cholesterol is at goal. The cholesterol last visit was:   Lab Results  Component Value Date   CHOL 130 12/27/2015   HDL 47 12/27/2015   LDLCALC 59 12/27/2015   TRIG 121 12/27/2015   CHOLHDL 2.8 12/27/2015     He has been working on diet and exercise for prediabetes, and denies foot ulcerations, hyperglycemia, hypoglycemia , increased appetite, nausea, paresthesia of the feet, polydipsia, polyuria, visual disturbances, vomiting and weight loss. Last A1C in the office was:  Lab Results  Component Value Date   HGBA1C 8.2 (H) 12/27/2015  He reports that he has been having some diarrhea.   Patient is on Vitamin D supplement.  Lab Results  Component Value Date   VD25OH 55 12/27/2015      Current Medications:  Current Outpatient Prescriptions on File Prior to Visit  Medication Sig Dispense Refill  . aspirin 81 MG tablet Take 81 mg by mouth daily.    . Cholecalciferol (VITAMIN D-3) 1000 UNITS CAPS Take 2,000 Units by mouth daily.     . fenofibrate micronized  (LOFIBRA) 134 MG capsule TAKE 1 CAPSULE (134 MG TOTAL) BY MOUTH DAILY BEFORE BREAKFAST. 90 capsule 1  . Magnesium 400 MG CAPS Take 400 mg by mouth daily.    . metFORMIN (GLUCOPHAGE) 500 MG tablet TAKE 2 TABLETS (1,000 MG TOTAL) BY MOUTH 2 (TWO) TIMES DAILY WITH A MEAL. 360 tablet 1  . metFORMIN (GLUCOPHAGE) 500 MG tablet TAKE 2 TABLETS(1000 MG) BY MOUTH TWICE DAILY WITH A MEAL 360 tablet 0  . simvastatin (ZOCOR) 40 MG tablet TAKE 1 TABLET (40 MG TOTAL) BY MOUTH DAILY. 90 tablet 1   No current facility-administered medications on file prior to visit.     Medical History:  Past Medical History:  Diagnosis Date  . Benign prostatic hypertrophy    history of post catheterization  . COPD (chronic obstructive pulmonary disease) ([place])   . Coronary artery disease    s/p PTA and stenting  . Diabetes mellitus   . Hyperlipidemia   . Hypertension   . Kidney stone   . Myocardial infarction ([place])   . Nephrolithiasis    history of cystoscopy  . Vitamin D deficiency     Allergies:  Allergies  Allergen Reactions  . Capoten [Captopril]     Fatigue/depression  . Lamisil [Terbinafine Hcl] Rash     Review of Systems:  Review of Systems  Constitutional: Negative for chills, fever and malaise/fatigue.  HENT: Negative for congestion, ear pain and sore throat.   Eyes: Negative.   Respiratory: Negative for cough,  shortness of breath and wheezing.   Cardiovascular: Negative for chest pain, palpitations and leg swelling.  Gastrointestinal: Positive for diarrhea. Negative for abdominal pain, blood in stool, constipation, heartburn and melena.  Genitourinary: Negative.   Skin: Negative.   Neurological: Negative for dizziness, sensory change, loss of consciousness and headaches.  Psychiatric/Behavioral: Negative for depression. The patient is not nervous/anxious and does not have insomnia.     Family history- Review and unchanged  Social history- Review and unchanged  Physical Exam: BP 108/64    Pulse 62   Temp 98.2 F (36.8 C) (Temporal)   Resp 14   Ht 6' 0.5" (1.842 m)   Wt 200 lb (90.7 kg)   BMI 26.75 kg/m  Wt Readings from Last 3 Encounters:  03/28/16 200 lb (90.7 kg)  12/27/15 202 lb 12.8 oz (92 kg)  09/14/15 205 lb 3.2 oz (93.1 kg)    General Appearance: Well nourished well developed, in no apparent distress. Eyes: PERRLA, EOMs, conjunctiva no swelling or erythema ENT/Mouth: Ear canals normal without obstruction, swelling, erythma, discharge.  TMs normal bilaterally.  Oropharynx moist, clear, without exudate, or postoropharyngeal swelling. Neck: Supple, thyroid normal,no cervical adenopathy  Respiratory: Respiratory effort normal, Breath sounds clear A&P without rhonchi, wheeze, or rale.  No retractions, no accessory usage. Cardio: RRR with no MRGs. Brisk peripheral pulses without edema.  Abdomen: Soft, + BS,  Non tender, no guarding, rebound, hernias, masses. Musculoskeletal: Full ROM, 5/5 strength, Normal gait Skin: Warm, dry without rashes, lesions, ecchymosis.  Neuro: Awake and oriented X 3, Cranial nerves intact. Normal muscle tone, no cerebellar symptoms. Psych: Normal affect, Insight and Judgment appropriate.    Starlyn Skeans, PA-C 11:06 AM [facility]

## 2016-03-29 ENCOUNTER — Other Ambulatory Visit: Payer: Self-pay | Admitting: Internal Medicine

## 2016-03-29 LAB — HEMOGLOBIN A1C
HEMOGLOBIN A1C: 8.6 % — AB (ref <5.7)
Mean Plasma Glucose: 200 mg/dL

## 2016-03-29 MED ORDER — GLIPIZIDE 5 MG PO TABS: 5.0000 mg | ORAL_TABLET | Freq: Every day | ORAL | 0 refills | Status: DC

## 2016-04-01 ENCOUNTER — Other Ambulatory Visit: Payer: Self-pay | Admitting: *Deleted

## 2016-04-23 ENCOUNTER — Ambulatory Visit (INDEPENDENT_AMBULATORY_CARE_PROVIDER_SITE_OTHER): Payer: Medicare Other | Admitting: *Deleted

## 2016-04-23 DIAGNOSIS — Z23 Encounter for immunization: Secondary | ICD-10-CM | POA: Diagnosis not present

## 2016-07-02 NOTE — Progress Notes (Signed)
ADULT & ADOLESCENT INTERNAL MEDICINE Isaiah Jackson, M.D.        Isaiah Jackson, P.A.-C       Starlyn Skeans, P.A.-C  [facility]                [address] [place], [place] [address] Telephone [phone] Telefax [phone] ______________________________________________________________________     This very nice 82 y.o. Isaiah Jackson presents for 6 month follow up with Hypertension, Hyperlipidemia, T2_NIDDM and Vitamin D Deficiency.      Patient is treated for HTN (1994) & BP has been controlled at home. Today's BP is at goal - 124/60. Patient has hx/o ASCAD/PTCA in 1996 & he had a negative Cardiolite scan in 2006.  Patient has had no complaints of any cardiac type chest pain, palpitations, dyspnea/orthopnea/PND, dizziness, claudication, or dependent edema.     Hyperlipidemia is controlled with diet & meds. Patient denies myalgias or other med SE's. Last Lipids were at goal: Lab Results  Component Value Date   CHOL 183 03/28/2016   HDL 52 03/28/2016   LDLCALC 101 03/28/2016   TRIG 149 03/28/2016   CHOLHDL 3.5 03/28/2016      Also, the patient has history of T2_NIDDM (2003) with CKD3 (GFR 58 ml/min)  and has had no symptoms of reactive hypoglycemia, diabetic polys, paresthesias or visual blurring.  He reports vert infrequent CBG's have been in the 140's+ and last A1c was not at goal:  Lab Results  Component Value Date   HGBA1C 8.6 (H) 03/28/2016      Further, the patient also has history of Vitamin D Deficiency in 2012 of "33" and supplements vitamin D without any suspected side-effects. Last vitamin D was near goal (70-100): Lab Results  Component Value Date   VD25OH 55 12/27/2015   Allergies  Allergen Reactions  . Capoten [Captopril]     Fatigue/depression  . Lamisil [Terbinafine Hcl] Rash   PMHx:   Past Medical History:  Diagnosis Date  . Benign prostatic hypertrophy    history of post catheterization  . COPD  (chronic obstructive pulmonary disease) ([place])   . Coronary artery disease    s/p PTA and stenting  . Diabetes mellitus   . Hyperlipidemia   . Hypertension   . Kidney stone   . Myocardial infarction   . Nephrolithiasis    history of cystoscopy  . Vitamin D deficiency    Immunization History  Administered Date(s) Administered  . Influenza Whole 06/16/2012, 03/26/2013  . Influenza, High Dose Seasonal PF 04/19/2014, 04/26/2015, 04/23/2016  . Td 05/26/2003   Past Surgical History:  Procedure Laterality Date  . ABDOMINAL AORTIC ANEURYSM REPAIR  10/23/2005   endograft  . cytoscopy     for kidney stones  . SHOULDER SURGERY Right    FHx:    Reviewed / unchanged  SHx:    Reviewed / unchanged  Systems Review:  Constitutional: Denies fever, chills, wt changes, headaches, insomnia, fatigue, night sweats, change in appetite. Eyes: Denies redness, blurred vision, diplopia, discharge, itchy, watery eyes.  ENT: Denies discharge, congestion, post nasal drip, epistaxis, sore throat, earache, hearing loss, dental pain, tinnitus, vertigo, sinus pain, snoring.  CV: Denies chest pain, palpitations, irregular heartbeat, syncope, dyspnea, diaphoresis, orthopnea, PND, claudication or edema. Respiratory: denies cough, dyspnea, DOE, pleurisy, hoarseness, laryngitis, wheezing.  Gastrointestinal: Denies dysphagia, odynophagia, heartburn, reflux, water brash, abdominal pain or cramps, nausea, vomiting, bloating, diarrhea, constipation,  hematemesis, melena, hematochezia  or hemorrhoids. Genitourinary: Denies dysuria, frequency, urgency, nocturia, hesitancy, discharge, hematuria or flank pain. Musculoskeletal: Denies arthralgias, myalgias, stiffness, jt. swelling, pain, limping or strain/sprain.  Skin: Denies pruritus, rash, hives, warts, acne, eczema or change in skin lesion(s). Neuro: No weakness, tremor, incoordination, spasms, paresthesia or pain. Psychiatric: Denies confusion, memory loss or sensory  loss. Endo: Denies change in weight, skin or hair change.  Heme/Lymph: No excessive bleeding, bruising or enlarged lymph nodes.  Physical Exam  BP 124/60   Pulse 60   Temp 97.9 F (36.6 C)   Resp 16   Ht 6' 0.5" (1.842 m)   Wt 205 lb 9.6 oz (93.3 kg)   BMI 27.50 kg/m   Appears well nourished and in no distress.  Eyes: PERRLA, EOMs, conjunctiva no swelling or erythema. Sinuses: No frontal/maxillary tenderness ENT/Mouth: EAC's clear, TM's nl w/o erythema, bulging. Nares clear w/o erythema, swelling, exudates. Oropharynx clear without erythema or exudates. Oral hygiene is good. Tongue normal, non obstructing. Hearing intact.  Neck: Supple. Thyroid nl. Car 2+/2+ without bruits, nodes or JVD. Chest: Respirations nl with BS clear & equal w/o rales, rhonchi, wheezing or stridor.  Cor: Heart sounds normal w/ regular rate and rhythm without sig. murmurs, gallops, clicks, or rubs. Peripheral pulses normal and equal  without edema.  Abdomen: Soft & bowel sounds normal. Non-tender w/o guarding, rebound, hernias, masses, or organomegaly.  Lymphatics: Unremarkable.  Musculoskeletal: Full ROM all peripheral extremities, joint stability, 5/5 strength, and normal gait.  Skin: Warm, dry without exposed rashes, lesions or ecchymosis apparent.  Neuro: Cranial nerves intact, reflexes equal bilaterally. Sensory-motor testing grossly intact. Tendon reflexes grossly intact.  Pysch: Alert & oriented x 3.  Insight and judgement nl & appropriate. No ideations.  Assessment and Plan:  1. Essential hypertension  - Continue medication, monitor blood pressure at home.  - Continue DASH diet. Reminder to go to the ER if any CP,  SOB, nausea, dizziness, severe HA, changes vision/speech,  left arm numbness and tingling and jaw pain.  - CBC with Differential/Platelet - BASIC METABOLIC PANEL WITH GFR - TSH  2. Mixed hyperlipidemia  - Continue diet/meds, exercise,& lifestyle modifications.  - Continue  monitor periodic cholesterol/liver & renal functions   - Hepatic function panel - Lipid panel - TSH  3. Type 2 diabetes mellitus with stage 3 chronic kidney disease  - Continue diet, exercise, lifestyle modifications.  - Monitor appropriate labs. - Hemoglobin A1c - Insulin, random  4. Vitamin D deficiency  - Continue supplementation. - VITAMIN D 25 Hydroxy  5. ASCAD s/p PTCA/Stents  - Lipid panel  6. Medication management  - CBC with Differential/Platelet - BASIC METABOLIC PANEL WITH GFR - Hepatic function panel - Magnesium       Recommended regular exercise, BP monitoring, weight control, and discussed med and SE's. Recommended labs to assess and monitor clinical status. Further disposition pending results of labs. Over 30 minutes of exam, counseling, chart review was performed

## 2016-07-02 NOTE — Patient Instructions (Signed)

## 2016-07-03 ENCOUNTER — Encounter: Payer: Self-pay | Admitting: Internal Medicine

## 2016-07-03 ENCOUNTER — Ambulatory Visit (INDEPENDENT_AMBULATORY_CARE_PROVIDER_SITE_OTHER): Payer: Medicare Other | Admitting: Internal Medicine

## 2016-07-03 VITALS — BP 124/60 | HR 60 | Temp 97.9°F | Resp 16 | Ht 72.5 in | Wt 205.6 lb

## 2016-07-03 DIAGNOSIS — I2510 Atherosclerotic heart disease of native coronary artery without angina pectoris: Secondary | ICD-10-CM | POA: Diagnosis not present

## 2016-07-03 DIAGNOSIS — E559 Vitamin D deficiency, unspecified: Secondary | ICD-10-CM

## 2016-07-03 DIAGNOSIS — N183 Chronic kidney disease, stage 3 (moderate): Secondary | ICD-10-CM

## 2016-07-03 DIAGNOSIS — Z79899 Other long term (current) drug therapy: Secondary | ICD-10-CM

## 2016-07-03 DIAGNOSIS — E782 Mixed hyperlipidemia: Secondary | ICD-10-CM

## 2016-07-03 DIAGNOSIS — E1122 Type 2 diabetes mellitus with diabetic chronic kidney disease: Secondary | ICD-10-CM | POA: Diagnosis not present

## 2016-07-03 DIAGNOSIS — I10 Essential (primary) hypertension: Secondary | ICD-10-CM | POA: Diagnosis not present

## 2016-07-03 LAB — CBC WITH DIFFERENTIAL/PLATELET
BASOS PCT: 1 %
Basophils Absolute: 75 cells/uL (ref 0–200)
EOS PCT: 1 %
Eosinophils Absolute: 75 cells/uL (ref 15–500)
HCT: 45 % (ref 38.5–50.0)
Hemoglobin: 14.9 g/dL (ref 13.2–17.1)
LYMPHS PCT: 39 %
Lymphs Abs: 2925 cells/uL (ref 850–3900)
MCH: 29.9 pg (ref 27.0–33.0)
MCHC: 33.1 g/dL (ref 32.0–36.0)
MCV: 90.4 fL (ref 80.0–100.0)
MONOS PCT: 8 %
MPV: 10 fL (ref 7.5–12.5)
Monocytes Absolute: 600 cells/uL (ref 200–950)
NEUTROS PCT: 51 %
Neutro Abs: 3825 cells/uL (ref 1500–7800)
PLATELETS: 255 10*3/uL (ref 140–400)
RBC: 4.98 MIL/uL (ref 4.20–5.80)
RDW: 13.3 % (ref 11.0–15.0)
WBC: 7.5 10*3/uL (ref 3.8–10.8)

## 2016-07-03 LAB — HEMOGLOBIN A1C
HEMOGLOBIN A1C: 8.7 % — AB (ref <5.7)
Mean Plasma Glucose: 203 mg/dL

## 2016-07-03 LAB — TSH: TSH: 2.2 mIU/L (ref 0.40–4.50)

## 2016-07-04 LAB — LIPID PANEL
CHOLESTEROL: 151 mg/dL (ref <200)
HDL: 48 mg/dL (ref >40)
LDL CALC: 67 mg/dL (ref <100)
TRIGLYCERIDES: 178 mg/dL — AB (ref <150)
Total CHOL/HDL Ratio: 3.1 Ratio (ref <5.0)
VLDL: 36 mg/dL — AB (ref <30)

## 2016-07-04 LAB — HEPATIC FUNCTION PANEL
ALT: 12 U/L (ref 9–46)
AST: 15 U/L (ref 10–35)
Albumin: 4.1 g/dL (ref 3.6–5.1)
Alkaline Phosphatase: 64 U/L (ref 40–115)
Bilirubin, Direct: 0.1 mg/dL
Indirect Bilirubin: 0.4 mg/dL (ref 0.2–1.2)
Total Bilirubin: 0.5 mg/dL (ref 0.2–1.2)
Total Protein: 6.7 g/dL (ref 6.1–8.1)

## 2016-07-04 LAB — BASIC METABOLIC PANEL WITH GFR
BUN: 20 mg/dL (ref 7–25)
CALCIUM: 9.8 mg/dL (ref 8.6–10.3)
CO2: 21 mmol/L (ref 20–31)
Chloride: 98 mmol/L (ref 98–110)
Creat: 1.33 mg/dL — ABNORMAL HIGH (ref 0.70–1.11)
GFR, EST AFRICAN AMERICAN: 58 mL/min — AB (ref >=60)
GFR, EST NON AFRICAN AMERICAN: 50 mL/min — AB (ref >=60)
Glucose, Bld: 268 mg/dL — ABNORMAL HIGH (ref 65–99)
POTASSIUM: 4.7 mmol/L (ref 3.5–5.3)
Sodium: 133 mmol/L — ABNORMAL LOW (ref 135–146)

## 2016-07-04 LAB — INSULIN, RANDOM: Insulin: 30.2 u[IU]/mL — ABNORMAL HIGH (ref 2.0–19.6)

## 2016-07-04 LAB — VITAMIN D 25 HYDROXY (VIT D DEFICIENCY, FRACTURES): VIT D 25 HYDROXY: 51 ng/mL (ref 30–100)

## 2016-07-04 LAB — MAGNESIUM: Magnesium: 1.8 mg/dL (ref 1.5–2.5)

## 2016-08-14 ENCOUNTER — Other Ambulatory Visit: Payer: Self-pay | Admitting: *Deleted

## 2016-08-14 MED ORDER — FENOFIBRATE MICRONIZED 134 MG PO CAPS: ORAL_CAPSULE | ORAL | 1 refills | Status: DC

## 2016-10-02 ENCOUNTER — Ambulatory Visit: Payer: Self-pay | Admitting: Physician Assistant

## 2016-10-11 ENCOUNTER — Ambulatory Visit (INDEPENDENT_AMBULATORY_CARE_PROVIDER_SITE_OTHER): Payer: Medicare Other | Admitting: Physician Assistant

## 2016-10-11 ENCOUNTER — Encounter: Payer: Self-pay | Admitting: Physician Assistant

## 2016-10-11 VITALS — BP 136/72 | HR 60 | Temp 98.2°F | Resp 16 | Ht 72.5 in | Wt 202.0 lb

## 2016-10-11 DIAGNOSIS — Z0001 Encounter for general adult medical examination with abnormal findings: Secondary | ICD-10-CM

## 2016-10-11 DIAGNOSIS — I10 Essential (primary) hypertension: Secondary | ICD-10-CM

## 2016-10-11 DIAGNOSIS — I2510 Atherosclerotic heart disease of native coronary artery without angina pectoris: Secondary | ICD-10-CM | POA: Diagnosis not present

## 2016-10-11 DIAGNOSIS — I739 Peripheral vascular disease, unspecified: Secondary | ICD-10-CM | POA: Diagnosis not present

## 2016-10-11 DIAGNOSIS — J42 Unspecified chronic bronchitis: Secondary | ICD-10-CM

## 2016-10-11 DIAGNOSIS — B351 Tinea unguium: Secondary | ICD-10-CM | POA: Diagnosis not present

## 2016-10-11 DIAGNOSIS — E1122 Type 2 diabetes mellitus with diabetic chronic kidney disease: Secondary | ICD-10-CM

## 2016-10-11 DIAGNOSIS — I700 Atherosclerosis of aorta: Secondary | ICD-10-CM | POA: Insufficient documentation

## 2016-10-11 DIAGNOSIS — I714 Abdominal aortic aneurysm, without rupture, unspecified: Secondary | ICD-10-CM

## 2016-10-11 DIAGNOSIS — E782 Mixed hyperlipidemia: Secondary | ICD-10-CM | POA: Diagnosis not present

## 2016-10-11 DIAGNOSIS — Z79899 Other long term (current) drug therapy: Secondary | ICD-10-CM

## 2016-10-11 DIAGNOSIS — N183 Chronic kidney disease, stage 3 (moderate): Secondary | ICD-10-CM

## 2016-10-11 DIAGNOSIS — Z0000 Encounter for general adult medical examination without abnormal findings: Secondary | ICD-10-CM

## 2016-10-11 DIAGNOSIS — Z6826 Body mass index (BMI) 26.0-26.9, adult: Secondary | ICD-10-CM

## 2016-10-11 DIAGNOSIS — E559 Vitamin D deficiency, unspecified: Secondary | ICD-10-CM | POA: Diagnosis not present

## 2016-10-11 DIAGNOSIS — R6889 Other general symptoms and signs: Secondary | ICD-10-CM

## 2016-10-11 LAB — HEPATIC FUNCTION PANEL
ALK PHOS: 66 U/L (ref 40–115)
ALT: 14 U/L (ref 9–46)
AST: 17 U/L (ref 10–35)
Albumin: 4 g/dL (ref 3.6–5.1)
BILIRUBIN DIRECT: 0.1 mg/dL (ref <=0.2)
BILIRUBIN INDIRECT: 0.4 mg/dL (ref 0.2–1.2)
Total Bilirubin: 0.5 mg/dL (ref 0.2–1.2)
Total Protein: 6.7 g/dL (ref 6.1–8.1)

## 2016-10-11 LAB — BASIC METABOLIC PANEL WITH GFR
BUN: 14 mg/dL (ref 7–25)
CALCIUM: 9.6 mg/dL (ref 8.6–10.3)
CHLORIDE: 100 mmol/L (ref 98–110)
CO2: 27 mmol/L (ref 20–31)
CREATININE: 1.15 mg/dL — AB (ref 0.70–1.11)
GFR, Est African American: 69 mL/min (ref >=60)
GFR, Est Non African American: 60 mL/min (ref >=60)
GLUCOSE: 242 mg/dL — AB (ref 65–99)
Potassium: 4.5 mmol/L (ref 3.5–5.3)
SODIUM: 134 mmol/L — AB (ref 135–146)

## 2016-10-11 LAB — LIPID PANEL
CHOL/HDL RATIO: 3.4 ratio (ref <5.0)
CHOLESTEROL: 140 mg/dL (ref <200)
HDL: 41 mg/dL (ref >40)
LDL CALC: 57 mg/dL (ref <100)
Triglycerides: 210 mg/dL — ABNORMAL HIGH (ref <150)
VLDL: 42 mg/dL — AB (ref <30)

## 2016-10-11 LAB — CBC WITH DIFFERENTIAL/PLATELET
BASOS PCT: 1 %
Basophils Absolute: 79 cells/uL (ref 0–200)
Eosinophils Absolute: 158 cells/uL (ref 15–500)
Eosinophils Relative: 2 %
HEMATOCRIT: 42.3 % (ref 38.5–50.0)
HEMOGLOBIN: 14.2 g/dL (ref 13.2–17.1)
LYMPHS ABS: 2686 {cells}/uL (ref 850–3900)
Lymphocytes Relative: 34 %
MCH: 30.3 pg (ref 27.0–33.0)
MCHC: 33.6 g/dL (ref 32.0–36.0)
MCV: 90.2 fL (ref 80.0–100.0)
MONO ABS: 632 {cells}/uL (ref 200–950)
MPV: 10.5 fL (ref 7.5–12.5)
Monocytes Relative: 8 %
NEUTROS ABS: 4345 {cells}/uL (ref 1500–7800)
NEUTROS PCT: 55 %
Platelets: 255 10*3/uL (ref 140–400)
RBC: 4.69 MIL/uL (ref 4.20–5.80)
RDW: 13.5 % (ref 11.0–15.0)
WBC: 7.9 10*3/uL (ref 3.8–10.8)

## 2016-10-11 LAB — TSH: TSH: 1.8 mIU/L (ref 0.40–4.50)

## 2016-10-11 LAB — MAGNESIUM: MAGNESIUM: 2 mg/dL (ref 1.5–2.5)

## 2016-10-11 MED ORDER — TERBINAFINE HCL 250 MG PO TABS: 250.0000 mg | ORAL_TABLET | Freq: Every day | ORAL | 0 refills | Status: AC

## 2016-10-11 NOTE — Progress Notes (Signed)
MEDICARE ANNUAL WELLNESS VISIT AND FOLLOW UP Assessment:   Essential hypertension - continue medications, DASH diet, exercise and monitor at home. Call if greater than 130/80.  -     CBC with Differential/Platelet -     Hepatic function panel -     BASIC METABOLIC PANEL WITH GFR -     TSH  Chronic bronchitis, unspecified chronic bronchitis type ([place]) Has stopped smoking, avoid triggers, no symptoms at this time.   Type 2 diabetes mellitus with stage 3 chronic kidney disease, without long-term current use of insulin ([place]) Discussed general issues about diabetes pathophysiology and management., Educational material distributed., Suggested low cholesterol diet., Encouraged aerobic exercise., Discussed foot care., Reminded to get yearly retinal exam. -     Hemoglobin A1c  Aneurysm of abdominal vessel (HCC) Control blood pressure, cholesterol, glucose, increase exercise.   ASCAD s/p PTCA/Stents Control blood pressure, cholesterol, glucose, increase exercise.  -     TSH -     Lipid panel  Mixed hyperlipidemia -continue medications, check lipids, decrease fatty foods, increase activity.  -     Lipid panel  Vitamin D deficiency Continue supplement  Medication management -     Magnesium  BMI 26.0-26.9,adult  Encounter for Medicare annual wellness exam  Fungal infection of toenail -     terbinafine (LAMISIL) 250 MG tablet; Take 1 tablet (250 mg total) by mouth daily. Take one daily for 3 months, need liver function lab at 6 weeks.  Atherosclerosis of aorta (HCC) Control blood pressure, cholesterol, glucose, increase exercise.   PAD (peripheral artery disease) (HCC) Control blood pressure, cholesterol, glucose, increase exercise.   Future Appointments Date Time Provider [place]  01/30/2017 10:00 AM [name], MD [facility] None    Plan:   During the course of the visit the patient was educated and counseled about appropriate screening and preventive services  including:    Pneumococcal vaccine   Influenza vaccine  Td vaccine  Screening electrocardiogram  Colorectal cancer screening  Diabetes screening  Glaucoma screening  Nutrition counseling    Subjective:  Isaiah Jackson is a 83 y.o. male who presents for Medicare Annual Wellness Visit and 3 month follow up for HTN, hyperlipidemia, diabetes, and vitamin D Def.   His blood pressure has been controlled at home, today their BP is BP: 136/72 He does workout, walks and dances on the weekends. He denies chest pain, shortness of breath, dizziness. Has COPD via CXR but denies SOB.  Has history of MI in 1994, [place] in 2996, negative stress test 2006, has history of AAA that is being monitored. Has history of PAD.  He is on cholesterol medication and denies myalgias. His cholesterol is at goal. The cholesterol last visit was:   Lab Results  Component Value Date   CHOL 151 07/03/2016   HDL 48 07/03/2016   LDLCALC 67 07/03/2016   TRIG 178 (H) 07/03/2016   CHOLHDL 3.1 07/03/2016  He has DMII since 2003 w/Stage II CKD.checks his sugars every morning at home runs 130's but batteries are dead, needs new ones, he is on ACE, he is on ASA.[name]  He has been working on diet and exercise for diabetes, and denies paresthesia of the feet, polydipsia, polyuria and visual disturbances. Last A1C in the office was:  Lab Results  Component Value Date   HGBA1C 8.7 (H) 07/03/2016   Patient is on Vitamin D supplement.   Lab Results  Component Value Date   VD25OH 51 07/03/2016    BMI  is Body mass index is 27.02 kg/m., he is working on diet and exercise. Wt Readings from Last 3 Encounters:  10/11/16 202 lb (91.6 kg)  07/03/16 205 lb 9.6 oz (93.3 kg)  03/28/16 200 lb (90.7 kg)     Names of Other Physician/Practitioners you currently use: 1. [facility] here for primary care 2. Dr. Einar Gip, eye doctor, last visit 1 year ago, 11/2015 3. Has false  teeth/dentures Patient Care Team: [name], MD as PCP - General (Internal Medicine) Jacolyn Reedy, MD as Consulting Physician (Cardiology) Elam Dutch, MD as Consulting Physician (Vascular Surgery) Laurence Spates, MD as Consulting Physician (Gastroenterology) Druscilla Brownie, MD as Consulting Physician (Dermatology) Dr. Sharol Given, ortho  Medication Review: Current Outpatient Prescriptions on File Prior to Visit  Medication Sig Dispense Refill  . aspirin 81 MG tablet Take 81 mg by mouth daily.    . Cholecalciferol (VITAMIN D-3) 1000 UNITS CAPS Take 2,000 Units by mouth daily.     . fenofibrate micronized (LOFIBRA) 134 MG capsule TAKE 1 CAPSULE (134 MG TOTAL) BY MOUTH DAILY BEFORE BREAKFAST. 90 capsule 1  . glipiZIDE (GLUCOTROL) 5 MG tablet Take 1 tablet (5 mg total) by mouth daily before breakfast. 30 tablet 0  . Magnesium 400 MG CAPS Take 400 mg by mouth daily.    . metFORMIN (GLUCOPHAGE XR) 500 MG 24 hr tablet Take 1 tablet with breakfast, take 2 tablets at lunch, and take 1 tablet at dinner. 120 tablet 0  . simvastatin (ZOCOR) 40 MG tablet TAKE 1 TABLET (40 MG TOTAL) BY MOUTH DAILY. 90 tablet 1   No current facility-administered medications on file prior to visit.     Current Problems (verified) Patient Active Problem List   Diagnosis Date Noted  . Encounter for Medicare annual wellness exam 08/28/2015  . BMI 26.0-26.9,adult 05/25/2015  . ASCAD s/p PTCA/Stents 11/07/2014  . Medication management 10/13/2013  . Type II diabetes mellitus with CKD 3 (GFR 58 ml/min)  (HCC) 10/13/2013  . Hyperlipidemia   . Hypertension   . Vitamin D deficiency   . COPD (chronic obstructive pulmonary disease) ([place])   . Aneurysm of abdominal vessel ([place]) 11/07/2011    Screening Tests Immunization History  Administered Date(s) Administered  . Influenza Whole 06/16/2012, 03/26/2013  . Influenza, High Dose Seasonal PF 04/19/2014, 04/26/2015, 04/23/2016  . Td 05/26/2003   Preventative  care: Last colonoscopy: 2014 per patient, and states every 2 years CXR 2012 Carotid Doppler neg 2013 Stress test 2002  AB aorta 2014  Prior vaccinations: TD or Tdap: 2004 declines Influenza: 20167 Pneumococcal: 2000 declines Prevnar 13: declines Shingles/Zostavax: declines  Allergies Allergies  Allergen Reactions  . Capoten [Captopril]     Fatigue/depression  . Lamisil [Terbinafine Hcl] Rash    SURGICAL HISTORY He  has a past surgical history that includes Abdominal aortic aneurysm repair (10/23/2005); cytoscopy; and Shoulder surgery (Right). FAMILY HISTORY His family history includes Diabetes in his daughter, mother, and sister; Heart attack (age of onset: 75) in his mother; Heart disease in his mother. SOCIAL HISTORY He  reports that he quit smoking about 24 years ago. He has never used smokeless tobacco. He reports that he does not drink alcohol or use drugs.  MEDICARE WELLNESS OBJECTIVES: Physical activity: Current Exercise Habits: Home exercise routine Cardiac risk factors: Cardiac Risk Factors include: advanced age (>75men, >79 women);dyslipidemia;diabetes mellitus;hypertension;male gender;family history of premature cardiovascular disease Depression/mood screen:   Depression screen [facility] 2/9 10/11/2016  Decreased Interest 0  Down, Depressed,  Hopeless 0  PHQ - 2 Score 0    ADLs:  In your present state of health, do you have any difficulty performing the following activities: 10/11/2016 07/03/2016  Hearing? Y N  Vision? N N  Difficulty concentrating or making decisions? N N  Walking or climbing stairs? N N  Dressing or bathing? N N  Doing errands, shopping? N N  Some recent data might be hidden     Cognitive Testing  Alert? Yes  Normal Appearance?Yes  Oriented to person? Yes  Place? Yes   Time? Yes  Recall of three objects?  2/3  Can perform simple calculations? Yes  Displays appropriate judgment?Yes  Can read the correct time from a watch face?Yes  EOL  planning: Does Patient Have a Medical Advance Directive?: Yes Type of Advance Directive: Healthcare Power of Attorney, Living will Does patient want to make changes to medical advance directive?: No - Patient declined Copy of [place] in Chart?: No - copy requested   Objective:   Blood pressure 136/72, pulse 60, temperature 98.2 F (36.8 C), temperature source Temporal, resp. rate 16, height 6' 0.5" (1.842 m), weight 202 lb (91.6 kg). Body mass index is 27.02 kg/m.  General appearance: alert, no distress, WD/WN, male HEENT: normocephalic, sclerae anicteric, TMs pearly, nares patent, no discharge or erythema, pharynx normal Oral cavity: Right posterior pharynx with 75mm white unscrapable lesion without erythema Neck: supple, no masses or adenopathy Heart: RRR, normal S1, S2, no murmurs Lungs: CTA bilaterally, no wheezes, rhonchi, or rales Abdomen: +bs, soft, non tender, non distended, no masses, no hepatomegaly, no splenomegaly Musculoskeletal: no swelling, no obvious deformity Extremities: no edema, no cyanosis, no clubbing Pulses: 2+ symmetric, upper and lower extremities, normal cap refill Neurological: alert, oriented x 3, CN2-12 intact, strength normal upper extremities and lower extremities, sensation normal throughout, DTRs 2+ throughout, no cerebellar signs, gait normal Skin: several seb keratosis, non healing ulcer right ear, will follow up Dr. Allyson Sabal Psychiatric: normal affect, behavior normal, pleasant   Medicare Attestation I have personally reviewed: The patient's medical and social history Their use of alcohol, tobacco or illicit drugs Their current medications and supplements The patient's functional ability including ADLs,fall risks, home safety risks, cognitive, and hearing and visual impairment Diet and physical activities Evidence for depression or mood disorders  The patient's weight, height, BMI, and visual acuity have been recorded in the  chart.  I have made referrals, counseling, and provided education to the patient based on review of the above and I have provided the patient with a written personalized care plan for preventive services.     Vicie Mutters, PA-C   10/11/2016

## 2016-10-11 NOTE — Patient Instructions (Signed)
Okay I will send in lamisil for you to take. You can only get it from [place], [place], [employer] or [employer] will not cover it.The lamisil is taken once a day for  months and then if can be taken for several months after for a month on it and month off it. It gets processed through you liver so we need to check your liver function at 6 weeks after taking the drug. It can take up to 6 months to a year for your nails to get better.       Bad carbs also include fruit juice, alcohol, and sweet tea. These are empty calories that do not signal to your brain that you are full.   Please remember the good carbs are still carbs which convert into sugar. So please measure them out no more than 1/2-1 cup of rice, oatmeal, pasta, and beans  Veggies are however free foods! Pile them on.   Not all fruit is created equal. Please see the list below, the fruit at the bottom is higher in sugars than the fruit at the top. Please avoid all dried fruits.     Your A1C is a measure of your sugar over the past 3 months and is not affected by what you have eaten over the past few days. Diabetes increases your chances of stroke and heart attack over 300 % and is the leading cause of blindness and kidney failure in the [place]. Please make sure you decrease bad carbs like white bread, white rice, potatoes, corn, soft drinks, pasta, cereals, refined sugars, sweet tea, dried fruits, and fruit juice. Good carbs are okay to eat in moderation like sweet potatoes, brown rice, whole grain pasta/bread, most fruit (except dried fruit) and you can eat as many veggies as you want.   Greater than 6.5 is considered diabetic. Between 6.4 and 5.7 is prediabetic If your A1C is less than 5.7 you are NOT diabetic.  Targets for Glucose Readings: Time of Check Target for patients WITHOUT Diabetes Target for DIABETICS  Before Meals Less than 100  less than 150  Two hours after meals Less than 200  Less than 250    Diabetes is a very complicated disease...lets simplify it.  An easy way to look at it to understand the complications is if you think of the extra sugar floating in your blood stream as glass shards floating through your blood stream.    Diabetes affects your small vessels first: 1) The glass shards (sugar) scraps down the tiny blood vessels in your eyes and lead to diabetic retinopathy, the leading cause of blindness in the [place]. Diabetes is the leading cause of newly diagnosed adult (106 to 81 years of age) blindness in the [place].  2) The glass shards scratches down the tiny vessels of your legs leading to nerve damage called neuropathy and can lead to amputations of your feet. More than 60% of all non-traumatic amputations of lower limbs occur in people with diabetes.  3) Over time the small vessels in your brain are shredded and closed off, individually this does not cause any problems but over a long period of time many of the small vessels being blocked can lead to Vascular Dementia.   4) Your kidney's are a filter system and have a "net" that keeps certain things in the body and lets bad things out. Sugar shreds this net and leads to kidney damage and eventually failure. Decreasing the sugar that is destroying  the net and certain blood pressure medications can help stop or decrease progression of kidney disease. Diabetes was the primary cause of kidney failure in 44 percent of all new cases in 2011.  5) Diabetes also destroys the small vessels in your penis that lead to erectile dysfunction. Eventually the vessels are so damaged that you may not be responsive to cialis or viagra.   Diabetes and your large vessels: Your larger vessels consist of your coronary arteries in your heart and the carotid vessels to your brain. Diabetes or even increased sugars put you at 300% increased risk of heart attack and stroke and this is why.. The sugar scrapes down your large blood vessels and your  body sees this as an internal injury and tries to repair itself. Just like you get a scab on your skin, your platelets will stick to the blood vessel wall trying to heal it. This is why we have diabetics on low dose aspirin daily, this prevents the platelets from sticking and can prevent plaque formation. In addition, your body takes cholesterol and tries to shove it into the open wound. This is why we want your LDL, or bad cholesterol, below 70.   The combination of platelets and cholesterol over 5-10 years forms plaque that can break off and cause a heart attack or stroke.   PLEASE REMEMBER:  Diabetes is preventable! Up to 54 percent of complications and morbidities among individuals with type 2 diabetes can be prevented, delayed, or effectively treated and minimized with regular visits to a health professional, appropriate monitoring and medication, and a healthy diet and lifestyle.

## 2016-10-12 LAB — HEMOGLOBIN A1C
Hgb A1c MFr Bld: 8.7 % — ABNORMAL HIGH (ref <5.7)
MEAN PLASMA GLUCOSE: 203 mg/dL

## 2016-10-14 NOTE — Progress Notes (Signed)
LVM for pt to return office call for LAB results.

## 2016-10-15 NOTE — Progress Notes (Signed)
Pt aware of lab results & voiced understanding of those results.

## 2017-01-30 ENCOUNTER — Ambulatory Visit (INDEPENDENT_AMBULATORY_CARE_PROVIDER_SITE_OTHER): Payer: Medicare Other | Admitting: Internal Medicine

## 2017-01-30 ENCOUNTER — Other Ambulatory Visit: Payer: Self-pay | Admitting: *Deleted

## 2017-01-30 VITALS — BP 140/70 | HR 56 | Temp 97.3°F | Resp 14 | Ht 72.5 in | Wt 204.6 lb

## 2017-01-30 DIAGNOSIS — E782 Mixed hyperlipidemia: Secondary | ICD-10-CM | POA: Diagnosis not present

## 2017-01-30 DIAGNOSIS — Z136 Encounter for screening for cardiovascular disorders: Secondary | ICD-10-CM

## 2017-01-30 DIAGNOSIS — E559 Vitamin D deficiency, unspecified: Secondary | ICD-10-CM | POA: Diagnosis not present

## 2017-01-30 DIAGNOSIS — N183 Chronic kidney disease, stage 3 (moderate): Secondary | ICD-10-CM | POA: Diagnosis not present

## 2017-01-30 DIAGNOSIS — I739 Peripheral vascular disease, unspecified: Secondary | ICD-10-CM

## 2017-01-30 DIAGNOSIS — Z79899 Other long term (current) drug therapy: Secondary | ICD-10-CM

## 2017-01-30 DIAGNOSIS — I2510 Atherosclerotic heart disease of native coronary artery without angina pectoris: Secondary | ICD-10-CM

## 2017-01-30 DIAGNOSIS — N401 Enlarged prostate with lower urinary tract symptoms: Secondary | ICD-10-CM

## 2017-01-30 DIAGNOSIS — Z1212 Encounter for screening for malignant neoplasm of rectum: Secondary | ICD-10-CM

## 2017-01-30 DIAGNOSIS — I10 Essential (primary) hypertension: Secondary | ICD-10-CM

## 2017-01-30 DIAGNOSIS — I700 Atherosclerosis of aorta: Secondary | ICD-10-CM

## 2017-01-30 DIAGNOSIS — Z125 Encounter for screening for malignant neoplasm of prostate: Secondary | ICD-10-CM

## 2017-01-30 DIAGNOSIS — E1122 Type 2 diabetes mellitus with diabetic chronic kidney disease: Secondary | ICD-10-CM

## 2017-01-30 LAB — LIPID PANEL
CHOLESTEROL: 145 mg/dL (ref <200)
HDL: 47 mg/dL (ref >40)
LDL Cholesterol: 65 mg/dL (ref <100)
Total CHOL/HDL Ratio: 3.1 Ratio (ref <5.0)
Triglycerides: 163 mg/dL — ABNORMAL HIGH (ref <150)
VLDL: 33 mg/dL — AB (ref <30)

## 2017-01-30 LAB — CBC WITH DIFFERENTIAL/PLATELET
BASOS PCT: 1 %
Basophils Absolute: 72 cells/uL (ref 0–200)
EOS PCT: 2 %
Eosinophils Absolute: 144 cells/uL (ref 15–500)
HCT: 44.9 % (ref 38.5–50.0)
HEMOGLOBIN: 15.1 g/dL (ref 13.2–17.1)
LYMPHS ABS: 2592 {cells}/uL (ref 850–3900)
Lymphocytes Relative: 36 %
MCH: 30.6 pg (ref 27.0–33.0)
MCHC: 33.6 g/dL (ref 32.0–36.0)
MCV: 90.9 fL (ref 80.0–100.0)
MPV: 10 fL (ref 7.5–12.5)
Monocytes Absolute: 576 cells/uL (ref 200–950)
Monocytes Relative: 8 %
NEUTROS ABS: 3816 {cells}/uL (ref 1500–7800)
Neutrophils Relative %: 53 %
Platelets: 249 10*3/uL (ref 140–400)
RBC: 4.94 MIL/uL (ref 4.20–5.80)
RDW: 13.2 % (ref 11.0–15.0)
WBC: 7.2 10*3/uL (ref 3.8–10.8)

## 2017-01-30 LAB — BASIC METABOLIC PANEL WITH GFR
BUN: 17 mg/dL (ref 7–25)
CHLORIDE: 99 mmol/L (ref 98–110)
CO2: 21 mmol/L (ref 20–31)
Calcium: 9.8 mg/dL (ref 8.6–10.3)
Creat: 1.21 mg/dL — ABNORMAL HIGH (ref 0.70–1.11)
GFR, EST NON AFRICAN AMERICAN: 56 mL/min — AB (ref >=60)
GFR, Est African American: 65 mL/min (ref >=60)
Glucose, Bld: 193 mg/dL — ABNORMAL HIGH (ref 65–99)
Potassium: 4.5 mmol/L (ref 3.5–5.3)
SODIUM: 135 mmol/L (ref 135–146)

## 2017-01-30 LAB — HEPATIC FUNCTION PANEL
ALT: 13 U/L (ref 9–46)
AST: 17 U/L (ref 10–35)
Albumin: 4.1 g/dL (ref 3.6–5.1)
Alkaline Phosphatase: 68 U/L (ref 40–115)
BILIRUBIN DIRECT: 0.2 mg/dL (ref <=0.2)
BILIRUBIN INDIRECT: 0.4 mg/dL (ref 0.2–1.2)
Total Bilirubin: 0.6 mg/dL (ref 0.2–1.2)
Total Protein: 6.7 g/dL (ref 6.1–8.1)

## 2017-01-30 LAB — TSH: TSH: 2.3 m[IU]/L (ref 0.40–4.50)

## 2017-01-30 MED ORDER — GLIPIZIDE 5 MG PO TABS: 5.0000 mg | ORAL_TABLET | Freq: Every day | ORAL | 1 refills | Status: DC

## 2017-01-30 MED ORDER — FENOFIBRATE MICRONIZED 134 MG PO CAPS: ORAL_CAPSULE | ORAL | 1 refills | Status: DC

## 2017-01-30 NOTE — Progress Notes (Signed)
[facility]   [name], M.D.      [name]. Silverio Lay, P.A.-C [facility]                [address] [place], [place] [address] Telephone [phone] Telefax [phone] Comprehensive Evaluation & Examination     This very nice 83 y.o. WWM presents for a comprehensive evaluation and management of multiple medical co-morbidities.  Patient has been followed for HTN, T2_NIDDM, Hyperlipidemia and Vitamin D Deficiency.Patient has hx/o BPH with LUTS improved.     HTN predates since 61. Patient's BP has been controlled at home.  Today's BP  is 142/68. In 1996 he had PTCA and in 2006 he had a negative Cardiolite.  Patient denies any cardiac symptoms as chest pain, palpitations, shortness of breath, dizziness or ankle swelling.     Patient's hyperlipidemia is controlled with diet and medications. Patient denies myalgias or other medication SE's. Last lipids were at goal albeit sl elevated Trig's: Lab Results  Component Value Date   CHOL 145 01/30/2017   HDL 47 01/30/2017   LDLCALC 65 01/30/2017   TRIG 163 (H) 01/30/2017   CHOLHDL 3.1 01/30/2017      Patient has T2_NIDDM w/CKD 3 (2003) and patient denies reactive hypoglycemic symptoms, visual blurring, diabetic polys or paresthesias. He alleges CBG's have ranged 120-140 mg%. Last A1c was not at goal: Lab Results  Component Value Date   HGBA1C 7.5 (H) 01/30/2017       Finally, patient has history of Vitamin D Deficiency ("33" in 2012) and last vitamin D was sl low (goal 70-100): Lab Results  Component Value Date   VD25OH 58 01/30/2017   Current Outpatient Prescriptions on File Prior to Visit  Medication Sig  . aspirin 81 MG Take daily.  [name] VITAMIN D 1000 UNITS Take 2,000 Units daily.   . Magnesium 400 MG Take  daily.  . metFORMIN-XR)500 MG  Takes 2 tablet twice a day  . simvastatin 40 MG tablet TAKE 1 TAB DAILY.   Allergies  Allergen Reactions   . Capoten [Captopril]     Fatigue/depression  . Lamisil [Terbinafine Hcl] Rash   Past Medical History:  Diagnosis Date  . Benign prostatic hypertrophy    history of post catheterization  . COPD (chronic obstructive pulmonary disease) ([place])   . Coronary artery disease    s/p PTA and stenting  . Diabetes mellitus   . Hyperlipidemia   . Hypertension   . Kidney stone   . Myocardial infarction ([place])   . Nephrolithiasis    history of cystoscopy  . Vitamin D deficiency    Health Maintenance  Topic Date Due  . OPHTHALMOLOGY EXAM  11/21/2016  . PNA vac Low Risk Adult (1 of 2 - PCV13) 11/18/2017 (Originally 12/20/2000)  . TETANUS/TDAP  02/15/2018 (Originally 05/25/2013)  . INFLUENZA VACCINE  02/05/2017  . HEMOGLOBIN A1C  08/02/2017  . FOOT EXAM  01/30/2018  . URINE MICROALBUMIN  01/30/2018   Immunization History  Administered Date(s) Administered  . Influenza Whole 06/16/2012, 03/26/2013  . Influenza, High Dose Seasonal PF 04/19/2014, 04/26/2015, 04/23/2016  . Td 05/26/2003   Past Surgical History:  Procedure Laterality Date  . ABDOMINAL AORTIC ANEURYSM REPAIR  10/23/2005   endograft  . cytoscopy     for kidney stones  . SHOULDER SURGERY Right    Family History  Problem Relation Age  of Onset  . Diabetes Mother   . Heart disease Mother   . Heart attack Mother 51       mother passed as a result  . Diabetes Daughter   . Diabetes Sister    Social History   Social History  . Marital status: Married    Spouse name: N/A  . Number of children: N/A  . Years of education: N/A   Occupational History   retired   Social History Main Topics  . Smoking status: Former Smoker    Quit date: 07/08/1992  . Smokeless tobacco: Never Used  . Alcohol use No  . Drug use: No  . Sexual activity: Not on file    ROS Constitutional: Denies fever, chills, weight loss/gain, headaches, insomnia,  night sweats or change in appetite. Does c/o fatigue. Eyes: Denies redness, blurred vision,  diplopia, discharge, itchy or watery eyes.  ENT: Denies discharge, congestion, post nasal drip, epistaxis, sore throat, earache, hearing loss, dental pain, Tinnitus, Vertigo, Sinus pain or snoring.  Cardio: Denies chest pain, palpitations, irregular heartbeat, syncope, dyspnea, diaphoresis, orthopnea, PND, claudication or edema Respiratory: denies cough, dyspnea, DOE, pleurisy, hoarseness, laryngitis or wheezing.  Gastrointestinal: Denies dysphagia, heartburn, reflux, water brash, pain, cramps, nausea, vomiting, bloating, diarrhea, constipation, hematemesis, melena, hematochezia, jaundice or hemorrhoids Genitourinary: Denies dysuria, frequency, urgency, nocturia, hesitancy, discharge, hematuria or flank pain Musculoskeletal: Denies arthralgia, myalgia, stiffness, Jt. Swelling, pain, limp or strain/sprain. Denies Falls. Skin: Denies puritis, rash, hives, warts, acne, eczema or change in skin lesion Neuro: No weakness, tremor, incoordination, spasms, paresthesia or pain Psychiatric: Denies confusion, memory loss or sensory loss. Denies Depression. Endocrine: Denies change in weight, skin, hair change, nocturia, and paresthesia, diabetic polys, visual blurring or hyper / hypo glycemic episodes.  Heme/Lymph: No excessive bleeding, bruising or enlarged lymph nodes.  Physical Exam  BP (!) 142/68   Pulse (!) 56   Temp (!) 97.3 F (36.3 C)   Resp 14   Ht 6' 0.5" (1.842 m)   Wt 204 lb 9.6 oz (92.8 kg)   BMI 27.37 kg/m   General Appearance: Well nourished and well groomed and in no apparent distress.  Eyes: PERRLA, EOMs, conjunctiva no swelling or erythema, normal fundi and vessels. Sinuses: No frontal/maxillary tenderness ENT/Mouth: EACs patent / TMs  nl. Nares clear without erythema, swelling, mucoid exudates. Oral hygiene is good. No erythema, swelling, or exudate. Tongue normal, non-obstructing. Tonsils not swollen or erythematous. Hearing normal.  Neck: Supple, thyroid normal. No bruits,  nodes or JVD. Respiratory: Respiratory effort normal.  BS equal and clear bilateral without rales, rhonci, wheezing or stridor. Cardio: Heart sounds are normal with regular rate and rhythm and no murmurs, rubs or gallops. Peripheral pulses are normal and equal bilaterally without edema. No aortic or femoral bruits. Chest: symmetric with normal excursions and percussion.  Abdomen: Soft, with Nl bowel sounds. Nontender, no guarding, rebound, hernias, masses, or organomegaly.  Lymphatics: Non tender without lymphadenopathy.  Genitourinary: No hernias.Testes nl. DRE - deferred for age. Musculoskeletal: Full ROM all peripheral extremities, joint stability, 5/5 strength, and normal gait. Skin: Warm and dry without rashes, lesions, cyanosis, clubbing or  ecchymosis.  Neuro: Cranial nerves intact, reflexes equal bilaterally. Normal muscle tone, no cerebellar symptoms. Sensation intact to touch, vibratory and Monofilament to the toes bilaterally. Pysch: Alert and oriented X 3 with normal affect, insight and judgment appropriate.   Assessment and Plan  1. Essential hypertension  - EKG 12-Lead - [place], RETROPERITNL ABD,  LTD - Urinalysis, Routine w  reflex microscopic - Microalbumin / creatinine urine ratio - CBC with Differential/Platelet - BASIC METABOLIC PANEL WITH GFR - Magnesium - TSH  2. Hyperlipidemia, mixed  - EKG 12-Lead - [place], RETROPERITNL ABD,  LTD - Hepatic function panel - Lipid panel - TSH  3. Type 2 diabetes mellitus with stage 3 chronic kidney disease, without long-term current use of insulin (HCC)  - EKG 12-Lead - [place], RETROPERITNL ABD,  LTD - HM DIABETES FOOT EXAM - LOW EXTREMITY NEUR EXAM DOCUM - Hemoglobin A1c - Insulin, random  4. Vitamin D deficiency  - VITAMIN D 25 Hydroxy  5. ASCAD s/p PTCA/Stents  - EKG 12-Lead - Lipid panel  6. Atherosclerosis of aorta (HCC)  - [place], RETROPERITNL ABD,  LTD - Lipid panel  7. PAD (peripheral artery disease) (HCC)  -  Lipid panel  8. Screening for rectal cancer  - POC Hemoccult Bld/Stl  9. Prostate cancer screening  - PSA  10. Benign localized prostatic hyperplasia with lower urinary tract symptoms (LUTS)   11. Screening for ischemic heart disease  - EKG 12-Lead  12. Screening for AAA (aortic abdominal aneurysm)  - [place], RETROPERITNL ABD,  LTD  13. Medication management  - Urinalysis, Routine w reflex microscopic - Microalbumin / creatinine urine ratio - CBC with Differential/Platelet - BASIC METABOLIC PANEL WITH GFR - Hepatic function panel - Magnesium - Lipid panel - TSH - Hemoglobin A1c - Insulin, random - VITAMIN D 25 Hydrox      Patient was counseled in prudent diet, weight control to achieve/maintain BMI less than 25, BP monitoring, regular exercise and medications as discussed.  Discussed med effects and SE's. Routine screening labs and tests as requested with regular follow-up as recommended. Over 40 minutes of exam, counseling, chart review and high complex critical decision making was performed

## 2017-01-30 NOTE — Patient Instructions (Signed)

## 2017-01-31 LAB — URINALYSIS, MICROSCOPIC ONLY
Bacteria, UA: NONE SEEN [HPF]
Casts: NONE SEEN [LPF]
Crystals: NONE SEEN [HPF]
RBC / HPF: NONE SEEN RBC/HPF (ref <=2)
Squamous Epithelial / LPF: NONE SEEN [HPF] (ref <=5)
WBC, UA: NONE SEEN WBC/HPF (ref <=5)
YEAST: NONE SEEN [HPF]

## 2017-01-31 LAB — URINALYSIS, ROUTINE W REFLEX MICROSCOPIC
Bilirubin Urine: NEGATIVE
Hgb urine dipstick: NEGATIVE
Ketones, ur: NEGATIVE
LEUKOCYTES UA: NEGATIVE
Nitrite: NEGATIVE
PROTEIN: NEGATIVE
Specific Gravity, Urine: 1.018 (ref 1.001–1.035)
pH: 6 (ref 5.0–8.0)

## 2017-01-31 LAB — HEMOGLOBIN A1C
HEMOGLOBIN A1C: 7.5 % — AB (ref <5.7)
Mean Plasma Glucose: 169 mg/dL

## 2017-01-31 LAB — MAGNESIUM: Magnesium: 1.9 mg/dL (ref 1.5–2.5)

## 2017-01-31 LAB — MICROALBUMIN / CREATININE URINE RATIO
Creatinine, Urine: 65 mg/dL (ref 20–370)
Microalb Creat Ratio: 18 mcg/mg creat (ref <30)
Microalb, Ur: 1.2 mg/dL

## 2017-01-31 LAB — INSULIN, RANDOM: Insulin: 57.6 u[IU]/mL — ABNORMAL HIGH (ref 2.0–19.6)

## 2017-01-31 LAB — VITAMIN D 25 HYDROXY (VIT D DEFICIENCY, FRACTURES): Vit D, 25-Hydroxy: 58 ng/mL (ref 30–100)

## 2017-01-31 LAB — PSA: PSA: 0.1 ng/mL (ref <=4.0)

## 2017-02-01 ENCOUNTER — Encounter: Payer: Self-pay | Admitting: Internal Medicine

## 2017-04-17 ENCOUNTER — Ambulatory Visit (INDEPENDENT_AMBULATORY_CARE_PROVIDER_SITE_OTHER): Payer: Medicare Other | Admitting: *Deleted

## 2017-04-17 DIAGNOSIS — Z23 Encounter for immunization: Secondary | ICD-10-CM | POA: Diagnosis not present

## 2017-06-04 ENCOUNTER — Ambulatory Visit: Payer: Self-pay | Admitting: Physician Assistant

## 2017-07-02 ENCOUNTER — Other Ambulatory Visit: Payer: Self-pay

## 2017-07-02 ENCOUNTER — Ambulatory Visit: Payer: Medicare Other | Admitting: Physician Assistant

## 2017-07-02 ENCOUNTER — Encounter: Payer: Self-pay | Admitting: Physician Assistant

## 2017-07-02 VITALS — BP 138/70 | HR 79 | Temp 97.3°F | Resp 16 | Ht 72.5 in | Wt 202.0 lb

## 2017-07-02 DIAGNOSIS — Z0000 Encounter for general adult medical examination without abnormal findings: Secondary | ICD-10-CM

## 2017-07-02 DIAGNOSIS — E782 Mixed hyperlipidemia: Secondary | ICD-10-CM | POA: Diagnosis not present

## 2017-07-02 DIAGNOSIS — I714 Abdominal aortic aneurysm, without rupture, unspecified: Secondary | ICD-10-CM

## 2017-07-02 DIAGNOSIS — I700 Atherosclerosis of aorta: Secondary | ICD-10-CM

## 2017-07-02 DIAGNOSIS — J42 Unspecified chronic bronchitis: Secondary | ICD-10-CM

## 2017-07-02 DIAGNOSIS — I739 Peripheral vascular disease, unspecified: Secondary | ICD-10-CM

## 2017-07-02 DIAGNOSIS — N183 Chronic kidney disease, stage 3 unspecified: Secondary | ICD-10-CM

## 2017-07-02 DIAGNOSIS — I10 Essential (primary) hypertension: Secondary | ICD-10-CM | POA: Diagnosis not present

## 2017-07-02 DIAGNOSIS — Z79899 Other long term (current) drug therapy: Secondary | ICD-10-CM

## 2017-07-02 DIAGNOSIS — E1122 Type 2 diabetes mellitus with diabetic chronic kidney disease: Secondary | ICD-10-CM | POA: Diagnosis not present

## 2017-07-02 DIAGNOSIS — I2510 Atherosclerotic heart disease of native coronary artery without angina pectoris: Secondary | ICD-10-CM

## 2017-07-02 DIAGNOSIS — E559 Vitamin D deficiency, unspecified: Secondary | ICD-10-CM

## 2017-07-02 DIAGNOSIS — R42 Dizziness and giddiness: Secondary | ICD-10-CM

## 2017-07-02 HISTORY — DX: Dizziness and giddiness: R42

## 2017-07-02 MED ORDER — MECLIZINE HCL 25 MG PO TABS: ORAL_TABLET | ORAL | 2 refills | Status: DC

## 2017-07-02 MED ORDER — FENOFIBRATE MICRONIZED 134 MG PO CAPS: ORAL_CAPSULE | ORAL | 1 refills | Status: DC

## 2017-07-02 MED ORDER — PREDNISONE 20 MG PO TABS: ORAL_TABLET | ORAL | 0 refills | Status: DC

## 2017-07-02 NOTE — Progress Notes (Signed)
CPE AND FOLLOW UP  Assessment:   Essential hypertension - continue medications, DASH diet, exercise and monitor at home. Call if greater than 130/80.  -     CBC with Differential/Platelet -     Hepatic function panel -     BASIC METABOLIC PANEL WITH GFR -     TSH  Chronic bronchitis, unspecified chronic bronchitis type ([place]) Has stopped smoking, avoid triggers, no symptoms at this time.   Type 2 diabetes mellitus with stage 3 chronic kidney disease, without long-term current use of insulin ([place]) Discussed general issues about diabetes pathophysiology and management., Educational material distributed., Suggested low cholesterol diet., Encouraged aerobic exercise., Discussed foot care., Reminded to get yearly retinal exam. -     Hemoglobin A1c  Aneurysm of abdominal vessel (HCC) Control blood pressure, cholesterol, glucose, increase exercise.   ASCAD s/p PTCA/Stents Control blood pressure, cholesterol, glucose, increase exercise.  -     TSH -     Lipid panel  Mixed hyperlipidemia -continue medications, check lipids, decrease fatty foods, increase activity.  -     Lipid panel  Vitamin D deficiency Continue supplement  Medication management -     Magnesium  Atherosclerosis of aorta (HCC) Control blood pressure, cholesterol, glucose, increase exercise.   PAD (peripheral artery disease) (HCC) Control blood pressure, cholesterol, glucose, increase exercise.   Chronic bronchitis, unspecified chronic bronchitis type (HCC) No triggers, well controlled symptoms, cont to monitor  Vertigo -nonsmoker, normal neuro, does have history of vertigo in the past, meclizine PRN, exercises given, add bASA, if worsening HA, changes vision/speech, imbalance, weakness go to the ER -     predniSONE (DELTASONE) 20 MG tablet; 2 tablets daily for 3 days, 1 tablet daily for 4 days. -     meclizine (ANTIVERT) 25 MG tablet; 1/2-1 pill up to 3 times daily for motion sickness/dizziness     Future  Appointments  Date Time Provider [place]  10/14/2017 11:00 AM Liane Comber, NP [facility] None  02/11/2018 11:00 AM [name], MD [facility] None    Subjective:  Isaiah Jackson is a 83 y.o. male who presents for CPE and 3 month follow up for HTN, hyperlipidemia, diabetes, and vitamin D Def.  He has history of vertigo, has been on prednisone/meclizine in the past. States when he first gets up in bed in the morning he has vertigo, sits for a while and gets better. When he starts walking around he gets dizzy and feels off balance. No dizziness with standing. Has bilateral ears feel full, no tinnitus. No changes in vision,  No incontinence. Feels weak all over but no focal weakness. He has been having sinus issues lately due to the season but no fever, chills. No palpitations. He denies any associated neurological complications or symptoms, such as one-sided weakness, numbness, tingling, slurring of speech, droopy face, swallowing difficulties, diplopia.   His blood pressure has been controlled at home, today their BP is BP: 138/70 He does workout, walks and dances on the weekends. He denies chest pain, shortness of breath. He has been having dizziness x several months.  Has COPD via CXR but denies SOB.  Has history of MI in 1994, [place] in 2996, negative stress test 2006, has history of AAA that is being monitored. Has history of PAD.  He is on cholesterol medication and denies myalgias. His cholesterol is at goal. The cholesterol last visit was:   Lab Results  Component Value Date   CHOL 145 01/30/2017   HDL 47  01/30/2017   LDLCALC 65 01/30/2017   TRIG 163 (H) 01/30/2017   CHOLHDL 3.1 01/30/2017   He has DMII since 2003 w/Stage II CKD.checks his sugars every morning at home runs 130's but batteries are dead, needs new ones, he is on ACE, he is on ASA.[name]  He has been working on diet and exercise for diabetes, and denies paresthesia of the feet, polydipsia, polyuria and visual  disturbances. Last A1C in the office was:  Lab Results  Component Value Date   HGBA1C 7.5 (H) 01/30/2017   Patient is on Vitamin D supplement.   Lab Results  Component Value Date   VD25OH 58 01/30/2017    BMI is Body mass index is 27.02 kg/m., he is working on diet and exercise. Wt Readings from Last 3 Encounters:  07/02/17 202 lb (91.6 kg)  01/30/17 204 lb 9.6 oz (92.8 kg)  10/11/16 202 lb (91.6 kg)     Names of Other Physician/Practitioners you currently use: 1. [facility] here for primary care 2. Dr. Einar Gip, eye doctor, last visit 1 year ago, 11/2015 reminded to get eye exam 3. Has false teeth/dentures Patient Care Team: [name], MD as PCP - General (Internal Medicine) Jacolyn Reedy, MD as Consulting Physician (Cardiology) Elam Dutch, MD as Consulting Physician (Vascular Surgery) Laurence Spates, MD as Consulting Physician (Gastroenterology) Druscilla Brownie, MD as Consulting Physician (Dermatology) Dr. Sharol Given, ortho  Medication Review: Current Outpatient Medications on File Prior to Visit  Medication Sig Dispense Refill  . aspirin 81 MG tablet Take 81 mg by mouth daily.    . Cholecalciferol (VITAMIN D-3) 1000 UNITS CAPS Take 2,000 Units by mouth daily.     [name] glipiZIDE (GLUCOTROL) 5 MG tablet Take 1 tablet (5 mg total) by mouth daily before breakfast. 90 tablet 1  . Magnesium 400 MG CAPS Take 400 mg by mouth daily.    . metFORMIN (GLUCOPHAGE XR) 500 MG 24 hr tablet Take 1 tablet with breakfast, take 2 tablets at lunch, and take 1 tablet at dinner. (Patient taking differently: Takes 2 tablet twice a day) 120 tablet 0  . simvastatin (ZOCOR) 40 MG tablet TAKE 1 TABLET (40 MG TOTAL) BY MOUTH DAILY. 90 tablet 1   No current facility-administered medications on file prior to visit.     Current Problems (verified) Patient Active Problem List   Diagnosis Date Noted  . Vertigo 07/02/2017  . Atherosclerosis of aorta  ([place]) 10/11/2016  . PAD (peripheral artery disease) ([place]) 10/11/2016  . Encounter for Medicare annual wellness exam 08/28/2015  . BMI 27.0-27.9,adult 05/25/2015  . ASCAD s/p PTCA/Stents 11/07/2014  . Medication management 10/13/2013  . Type II diabetes mellitus with CKD 3 (GFR 58 ml/min)  (HCC) 10/13/2013  . Hyperlipidemia   . Hypertension   . Vitamin D deficiency   . COPD (chronic obstructive pulmonary disease) ([place])   . Aneurysm of abdominal vessel ([place]) 11/07/2011    Screening Tests Immunization History  Administered Date(s) Administered  . Influenza Whole 06/16/2012, 03/26/2013  . Influenza, High Dose Seasonal PF 04/19/2014, 04/26/2015, 04/23/2016, 04/17/2017  . Td 05/26/2003   Preventative care: Last colonoscopy: 2014 per patient, declines another CXR 2012 Carotid Doppler neg 2013 Stress test 2002  AB aorta 2014  Prior vaccinations: TD or Tdap: 2014 declines Influenza: 2018 Pneumococcal: 2000 declines Prevnar 13: declines Shingles/Zostavax: declines  Allergies Allergies  Allergen Reactions  . Capoten [Captopril]     Fatigue/depression  . Lamisil [Terbinafine] Rash    SURGICAL  HISTORY He  has a past surgical history that includes Abdominal aortic aneurysm repair (10/23/2005); cytoscopy; and Shoulder surgery (Right). FAMILY HISTORY His family history includes Diabetes in his daughter, mother, and sister; Heart attack (age of onset: 14) in his mother; Heart disease in his mother. SOCIAL HISTORY He  reports that he quit smoking about 25 years ago. he has never used smokeless tobacco. He reports that he does not drink alcohol or use drugs.   Objective:   Blood pressure 138/70, pulse 79, temperature (!) 97.3 F (36.3 C), resp. rate 16, height 6' 0.5" (1.842 m), weight 202 lb (91.6 kg), SpO2 98 %. Body mass index is 27.02 kg/m.  General appearance: alert, no distress, WD/WN, male HEENT: normocephalic, sclerae anicteric, TMs pearly, nares patent, no discharge or  erythema, pharynx normal Oral cavity: Right posterior pharynx with 58mm white unscrapable lesion without erythema Neck: supple, no masses or adenopathy Heart: RRR, normal S1, S2, no murmurs Lungs: CTA bilaterally, no wheezes, rhonchi, or rales Abdomen: +bs, soft, non tender, non distended, no masses, no hepatomegaly, no splenomegaly Musculoskeletal: no swelling, no obvious deformity Extremities: no edema, no cyanosis, no clubbing Pulses: 2+ symmetric, upper and lower extremities, normal cap refill Neurological: alert, oriented x 3, CN2-12 intact, strength normal upper extremities and lower extremities, sensation normal throughout, DTRs 2+ throughout, no cerebellar signs, gait normal Skin: several seb keratosis, non healing ulcer right ear, will follow up Dr. Allyson Sabal Psychiatric: normal affect, behavior normal, pleasant      Vicie Mutters, PA-C   07/02/2017

## 2017-07-02 NOTE — Patient Instructions (Addendum)
Get eye exam!!! Dr. Einar Gip  Please get over the counter sudafed to take for your ears Can also get a nasal spray over the counter like nasonex or flonase  Can do a steroid nasal spary 1-2 sparys at night each nostril. Remember to spray each nostril twice towards the outer part of your eye.  Do not sniff but instead pinch your nose and tilt your head back to help the medicine get into your sinuses.  The best time to do this is at bedtime. Stop if you get blurred vision or nose bleeds.   Take the prednisone but monitor your sugars, this increases it  Take the meclizine every night once a night and 1/2-1 pill AS needed during the day up to 3 x a day  However if you have worsening HA, changes vision/speech, weakness go to the ER  Wear your hearing aids, get your batteries charged!!   Benign Paroxysmal Positional Vertigo (BPPV)  General Information In Benign Paroxysmal Positional Vertigo (BPPV) dizziness is generally thought to be due to debris which has collected within a part of the inner ear. This debris can be thought of as "ear rocks", although the formal name is "otoconia". Ear rocks are small crystals of calcium carbonate derived from a structure in the ear called the "utricle" (figure to the right ). The symptoms of BPPV include dizziness or vertigo, lightheadedness, imbalance, and nausea. Activities which bring on symptoms will vary among persons, but symptoms are usually followed by a change of position of the head like getting out of bed or rolling over in bed are common "problem" motions .      What can be done? 1) Use two or more pillows at night. Avoid sleeping on the "bad" side. In the morning, get up slowly and sit on the edge of the bed for a minute. 2) Medication prescribed by your doctor.  3) The exercises below, you can do at home to help you prevent the sensation later in the day.  4) We can refer you to physical therapy at [facility], # Man treatments: The Brandt-Daroff Exercises are a home method of treating BPPV,and are effective 95% of the time.  These exercises are performed in three sets per day for two weeks. In each set, one performs the maneuver as shown five times. Start sitting upright (position 1). Then move into the side-lying position (position 2), with the head angled upward about halfway. An easy way to remember this is to imagine someone standing about 6 feet in front of you, and just keep looking at their head at all times. Stay in the side-lying position for 30 seconds, or until the dizziness subsides if this is longer, then go back to the sitting position (position 3). Stay there for 30 seconds, and then go to the opposite side (position 4) and follow the same routine.  At home Epley Maneuver This procedure seems to be even more effective than the in-office procedure, perhaps because it is repeated every night for a week.  The method (for the left side) is performed as shown on the figure below.  1) One stays in each of the supine (lying down) positions for 30 seconds, and in the sitting upright position (top) for 1 minute.  2) Thus, once cycle takes 2 1/2 minutes.  3) Typically 3 cycles are performed just prior to going to sleep.  4) It is best to do them at night rather than in the  morning or midday, as if one becomes dizzy following the exercises, then it can resolve while one is sleeping.  The mirror image of this procedure is used for the right ear.

## 2017-07-03 LAB — CBC WITH DIFFERENTIAL/PLATELET
BASOS ABS: 82 {cells}/uL (ref 0–200)
Basophils Relative: 1 %
EOS ABS: 139 {cells}/uL (ref 15–500)
Eosinophils Relative: 1.7 %
HEMATOCRIT: 46.7 % (ref 38.5–50.0)
Hemoglobin: 16.1 g/dL (ref 13.2–17.1)
LYMPHS ABS: 2854 {cells}/uL (ref 850–3900)
MCH: 30.7 pg (ref 27.0–33.0)
MCHC: 34.5 g/dL (ref 32.0–36.0)
MCV: 89.1 fL (ref 80.0–100.0)
MPV: 10.8 fL (ref 7.5–12.5)
Monocytes Relative: 7.2 %
NEUTROS PCT: 55.3 %
Neutro Abs: 4535 cells/uL (ref 1500–7800)
PLATELETS: 237 10*3/uL (ref 140–400)
RBC: 5.24 10*6/uL (ref 4.20–5.80)
RDW: 12.2 % (ref 11.0–15.0)
TOTAL LYMPHOCYTE: 34.8 %
WBC: 8.2 10*3/uL (ref 3.8–10.8)
WBCMIX: 590 {cells}/uL (ref 200–950)

## 2017-07-03 LAB — LIPID PANEL
Cholesterol: 164 mg/dL (ref <200)
HDL: 48 mg/dL (ref >40)
LDL CHOLESTEROL (CALC): 80 mg/dL
NON-HDL CHOLESTEROL (CALC): 116 mg/dL (ref <130)
TRIGLYCERIDES: 298 mg/dL — AB (ref <150)
Total CHOL/HDL Ratio: 3.4 (calc) (ref <5.0)

## 2017-07-03 LAB — BASIC METABOLIC PANEL WITH GFR
BUN / CREAT RATIO: 16 (calc) (ref 6–22)
BUN: 18 mg/dL (ref 7–25)
CHLORIDE: 97 mmol/L — AB (ref 98–110)
CO2: 29 mmol/L (ref 20–32)
Calcium: 10.3 mg/dL (ref 8.6–10.3)
Creat: 1.13 mg/dL — ABNORMAL HIGH (ref 0.70–1.11)
GFR, EST AFRICAN AMERICAN: 70 mL/min/{1.73_m2} (ref >=60)
GFR, EST NON AFRICAN AMERICAN: 61 mL/min/{1.73_m2} (ref >=60)
Glucose, Bld: 279 mg/dL — ABNORMAL HIGH (ref 65–99)
POTASSIUM: 4.8 mmol/L (ref 3.5–5.3)
Sodium: 133 mmol/L — ABNORMAL LOW (ref 135–146)

## 2017-07-03 LAB — HEPATIC FUNCTION PANEL
AG Ratio: 1.8 (calc) (ref 1.0–2.5)
ALBUMIN MSPROF: 4.3 g/dL (ref 3.6–5.1)
ALT: 14 U/L (ref 9–46)
AST: 17 U/L (ref 10–35)
Alkaline phosphatase (APISO): 69 U/L (ref 40–115)
BILIRUBIN DIRECT: 0.1 mg/dL (ref 0.0–0.2)
BILIRUBIN INDIRECT: 0.5 mg/dL (ref 0.2–1.2)
BILIRUBIN TOTAL: 0.6 mg/dL (ref 0.2–1.2)
Globulin: 2.4 g/dL (calc) (ref 1.9–3.7)
Total Protein: 6.7 g/dL (ref 6.1–8.1)

## 2017-07-03 LAB — HEMOGLOBIN A1C
HEMOGLOBIN A1C: 9 %{Hb} — AB (ref <5.7)
MEAN PLASMA GLUCOSE: 212 (calc)
eAG (mmol/L): 11.7 (calc)

## 2017-07-03 LAB — TSH: TSH: 1.88 m[IU]/L (ref 0.40–4.50)

## 2017-07-03 LAB — MAGNESIUM: MAGNESIUM: 1.8 mg/dL (ref 1.5–2.5)

## 2017-09-04 ENCOUNTER — Ambulatory Visit: Payer: Self-pay | Admitting: Internal Medicine

## 2017-10-13 DIAGNOSIS — N183 Chronic kidney disease, stage 3 (moderate): Secondary | ICD-10-CM

## 2017-10-13 DIAGNOSIS — N184 Chronic kidney disease, stage 4 (severe): Secondary | ICD-10-CM | POA: Insufficient documentation

## 2017-10-13 DIAGNOSIS — E1122 Type 2 diabetes mellitus with diabetic chronic kidney disease: Secondary | ICD-10-CM | POA: Insufficient documentation

## 2017-10-13 NOTE — Progress Notes (Signed)
MEDICARE ANNUAL WELLNESS VISIT AND FOLLOW UP Assessment:   Diagnoses and all orders for this visit:  Encounter for Medicare annual wellness exam  Left shoulder pain, acute x3 days, Cardiac vs musculoskeletal; EKG stable from previous demonstrating previous infarct, will obtain stat Troponin/CKMB to r/o NSTEMI today If negative, start tylenol/NSAID which he has not tried, can send in steroid taper ROM intact; reevaluate as needed for continued pain  PAD (peripheral artery disease) ([place]) Control blood pressure, cholesterol, glucose, increase exercise.   Essential hypertension Not currently on medications; previously had problems with ?bradycardia vs hypotension - fatigue/dizziness Will initiate telmisartan 40 mg - to start by taking 1/2 tab for 20 mg daily Monitor blood pressure at home; call if consistently above goal Continue DASH diet.   Reminder to go to the ER if any CP, SOB, nausea, dizziness, severe HA, changes vision/speech, left arm numbness and tingling and jaw pain.  Atherosclerosis of aorta (HCC) Control blood pressure, cholesterol, glucose, increase exercise.   ASCAD s/p PTCA/Stents Control blood pressure, cholesterol, glucose, increase exercise.  Followed by cardiology  Aneurysm of abdominal vessel ([place]) S/p repair; control BP; followed by [facility]; stable on follow up imaging and was released by vascular -   Chronic bronchitis, unspecified chronic bronchitis type ([place]) Has stopped smoking, avoid triggers, no symptoms at this time.   Type 2 diabetes mellitus with stage 3 chronic kidney disease, without long-term current use of insulin [facility]) Education: Reviewed 'ABCs' of diabetes management (respective goals in parentheses):  A1C (<7), blood pressure (<130/80), and cholesterol (LDL <70) Has only been taking metformin 500 mg BID; previously didn't tolerate 4 tabs daily - encouraged to increase to 2 tabs with largest meal (breakfast) and 1 tab with dinner - if  tolerates will further taper to maximize therapy Continue with daily glipizide 5 mg, taper up as needed after metformin therapy maximized Eye Exam yearly and Dental Exam every 6 months - ophthalmology report requested Dietary recommendations Physical Activity recommendations Foot exam done  CKD stage 3 due to type 2 diabetes mellitus (HCC) Increase fluids, avoid NSAIDS, monitor sugars, will monitor Check BMP/GFR  Vitamin D deficiency Continue supplementation for goal of 70-100 Check vitamin D level  Vertigo Hasn't had symptoms recently; has meclizine if needed  Medication management CBC, BMP/GFR, LFTs  Mixed hyperlipidemia Continue medications Continue low cholesterol diet and exercise.  Check lipid panel.   BMI 26 Long discussion about weight loss, diet, and exercise Recommended diet heavy in fruits and veggies and low in animal meats, cheeses, and dairy products, appropriate calorie intake Discussed appropriate weight for height  Follow up at next visit   Over 30 minutes of exam, counseling, chart review, and critical decision making was performed  Future Appointments  Date Time Provider [place]  02/11/2018 11:00 AM [name], MD [facility] None  07/02/2018  9:00 AM Liane Comber, NP [facility] None    Plan:   During the course of the visit the patient was educated and counseled about appropriate screening and preventive services including:    Pneumococcal vaccine   Influenza vaccine  Prevnar 13  Td vaccine  Screening electrocardiogram  Colorectal cancer screening  Diabetes screening  Glaucoma screening  Nutrition counseling    Subjective:  Isaiah Jackson is a 84 y.o. male who presents for Medicare Annual Wellness Visit and 3 month follow up for HTN, hyperlipidemia, T2DM, and vitamin D Def. In 1996 he had PTCA/stents and in 2006 he had a negative Cardiolite. He has hx of  AAA s/p stent graft by Dr. Amedeo Plenty which has been stable on  monitoring by [facility]. Patient has hx/o BPH with LUTS improved.  He presents today c/o left shoulder blade pain, arm achiness and some neck pain on the left which he reports he woke up with 3 days ago and has been constant. He reports pain is 7/10 or so, denies chest pain/pressure, worse pain with exertion, dyspnea, nausea, anxiety. He denies recent exertion, strain or injury involving his shoulder. He is concerned about his heart today due to cardiac history. EKG was obtained and shows old infarct which is consistent with EKG obtained on 01/30/2017 without notable changes.   BMI is Body mass index is 26.62 kg/m., he has been working on diet and exercise. Wt Readings from Last 3 Encounters:  10/14/17 199 lb (90.3 kg)  07/02/17 202 lb (91.6 kg)  01/30/17 204 lb 9.6 oz (92.8 kg)   He does not check BP at home, today their BP is 150/89. He is not currently on treatment for BP. Reports he was treated for this several years ago, but was d/c'd due to symptomatic hypotension.  He does workout. He denies chest pain, shortness of breath, dizziness.   He is on cholesterol medication (simvastatin 40 mg every other day) and denies myalgias. His LDL cholesterol is at goal; triglycerides remain elevated. The cholesterol last visit was:   Lab Results  Component Value Date   CHOL 164 07/02/2017   HDL 48 07/02/2017   LDLCALC 80 07/02/2017   TRIG 298 (H) 07/02/2017   CHOLHDL 3.4 07/02/2017   He has been working on diet and exercise for T2DM, and denies foot ulcerations, hyperglycemia, hypoglycemia , increased appetite, nausea, paresthesia of the feet, polydipsia, polyuria, visual disturbances, vomiting and weight loss. He does check occasional fasting blood sugars, reports running between 130-150. Last A1C in the office was:  Lab Results  Component Value Date   HGBA1C 9.0 (H) 07/02/2017   Last GFR Lab Results  Component Value Date   GFRNONAA 61 07/02/2017    Patient is on Vitamin D supplement  and near goal at last check:    Lab Results  Component Value Date   VD25OH 58 01/30/2017      Medication Review: Current Outpatient Medications on File Prior to Visit  Medication Sig Dispense Refill  . aspirin 81 MG tablet Take 81 mg by mouth daily.    . Cholecalciferol (VITAMIN D-3) 1000 UNITS CAPS Take 2,000 Units by mouth daily.     [name] glipiZIDE (GLUCOTROL) 5 MG tablet Take 1 tablet (5 mg total) by mouth daily before breakfast. 90 tablet 1  . Magnesium 400 MG CAPS Take 400 mg by mouth daily.    . meclizine (ANTIVERT) 25 MG tablet 1/2-1 pill up to 3 times daily for motion sickness/dizziness 60 tablet 2  . predniSONE (DELTASONE) 20 MG tablet 2 tablets daily for 3 days, 1 tablet daily for 4 days. 10 tablet 0  . simvastatin (ZOCOR) 40 MG tablet TAKE 1 TABLET (40 MG TOTAL) BY MOUTH DAILY. 90 tablet 1   No current facility-administered medications on file prior to visit.     Allergies: Allergies  Allergen Reactions  . Capoten [Captopril]     Fatigue/depression  . Lamisil [Terbinafine] Rash    Current Problems (verified) has Aneurysm of abdominal vessel ([place]); Hyperlipidemia; Hypertension; Vitamin D deficiency; COPD (chronic obstructive pulmonary disease) ([place]); Medication management; Type II diabetes mellitus with CKD 3 (GFR 58 ml/min)  (HCC); ASCAD s/p PTCA/Stents;  BMI 27.0-27.9,adult; Encounter for Medicare annual wellness exam; Atherosclerosis of aorta ([place]); PAD (peripheral artery disease) ([place]); Vertigo; and CKD stage 3 due to type 2 diabetes mellitus (HCC) on their problem list.  Screening Tests Immunization History  Administered Date(s) Administered  . Influenza Whole 06/16/2012, 03/26/2013  . Influenza, High Dose Seasonal PF 04/19/2014, 04/26/2015, 04/23/2016, 04/17/2017  . Td 05/26/2003   Preventative care: Last colonoscopy: 2014 per patient, was advised none further  CXR 2012 Carotid Doppler neg 2013 Stress test 2002  AB aorta 2014  Prior vaccinations: TD or Tdap:  2004, declines further Influenza: 2018 Pneumococcal: 2000, declines Prevnar 13: declines Shingles/Zostavax: declines  Names of Other Physician/Practitioners you currently use: 1. [facility] here for primary care [facility]. [facility], last visit 2019 -  need reports - reqeusted 3. , dentist, last visit 2018 - full dentures  Patient Care Team: [name], MD as PCP - General (Internal Medicine) Jacolyn Reedy, MD as Consulting Physician (Cardiology) Elam Dutch, MD as Consulting Physician (Vascular Surgery) Laurence Spates, MD as Consulting Physician (Gastroenterology) Druscilla Brownie, MD as Consulting Physician (Dermatology)  Surgical: He  has a past surgical history that includes Abdominal aortic aneurysm repair (10/23/2005); cytoscopy; and Shoulder surgery (Right). Family His family history includes Diabetes in his daughter, mother, and sister; Heart attack (age of onset: 53) in his mother; Heart disease in his mother. Social history  He reports that he quit smoking about 25 years ago. He has never used smokeless tobacco. He reports that he does not drink alcohol or use drugs.  MEDICARE WELLNESS OBJECTIVES: Physical activity: Current Exercise Habits: Structured exercise class, Type of exercise: Other - see comments(Dancing), Time (Minutes): 60, Frequency (Times/Week): 3, Weekly Exercise (Minutes/Week): 180, Intensity: Moderate, Exercise limited by: None identified Cardiac risk factors: Cardiac Risk Factors include: hypertension;male gender;advanced age (>84men, >20 women);smoking/ tobacco exposure Depression/mood screen:   Depression screen [facility] 2/9 10/14/2017  Decreased Interest 0  Down, Depressed, Hopeless 0  PHQ - 2 Score 0    ADLs:  In your present state of health, do you have any difficulty performing the following activities: 10/14/2017 02/01/2017  Hearing? 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 N N  Difficulty concentrating or making decisions? N N  Walking or climbing stairs? N N  Dressing or bathing? N N  Doing errands, shopping? N N  Some recent data might be hidden     Cognitive Testing  Alert? Yes  Normal Appearance?Yes  Oriented to person? Yes  Place? Yes   Time? Yes  Recall of three objects?  Yes  Can perform simple calculations? Yes  Displays appropriate judgment?Yes  Can read the correct time from a watch face?Yes  EOL planning: Does Patient Have a Medical Advance Directive?: Yes Type of Advance Directive: Healthcare Power of Attorney, Living will Does patient want to make changes to medical advance directive?: No - Patient declined Copy of [place] in Chart?: No - copy requested   Objective:   Today's Vitals   10/14/17 1037  Temp: (!) 97.5 F (36.4 C)  SpO2: 97%  Weight: 199 lb (90.3 kg)  Height: 6' 0.5" (1.842 m)   Body mass index is 26.62 kg/m.  General appearance: alert, no distress, WD/WN, male HEENT: normocephalic, sclerae anicteric, TMs pearly, nares patent, no discharge or erythema, pharynx normal Oral cavity: MMM, no lesions. HOH, not wearing his hearing aids.  Neck: supple,  no lymphadenopathy, no thyromegaly, no masses Heart: RRR, normal S1, S2, no murmurs Lungs: CTA bilaterally, no wheezes, rhonchi, or rales Abdomen: +bs, soft, non tender, non distended, no masses, no hepatomegaly, no splenomegaly Musculoskeletal: nontender, no swelling, no obvious deformity, demonstrates full active ROM of neck, bilateral shoulders, no crepitus, effusion, palpable abnormality to left shoulder. Some ? Tenderness to generalized area without point tenderness.  Extremities: no edema, no cyanosis, no clubbing Pulses: 2+ symmetric, upper and lower extremities, normal cap refill Neurological: alert, oriented x 3, CN2-12 intact, strength normal upper extremities and lower extremities, sensation normal throughout, DTRs 2+  throughout, no cerebellar signs, gait normal Psychiatric: normal affect, behavior normal, pleasant   Medicare Attestation I have personally reviewed: The patient's medical and social history Their use of alcohol, tobacco or illicit drugs Their current medications and supplements The patient's functional ability including ADLs,fall risks, home safety risks, cognitive, and hearing and visual impairment Diet and physical activities Evidence for depression or mood disorders  The patient's weight, height, BMI, and visual acuity have been recorded in the chart.  I have made referrals, counseling, and provided education to the patient based on review of the above and I have provided the patient with a written personalized care plan for preventive services.     Izora Ribas, NP   10/14/2017

## 2017-10-14 ENCOUNTER — Ambulatory Visit: Payer: Medicare Other | Admitting: Adult Health

## 2017-10-14 ENCOUNTER — Encounter: Payer: Self-pay | Admitting: Adult Health

## 2017-10-14 VITALS — Temp 97.5°F | Ht 72.5 in | Wt 199.0 lb

## 2017-10-14 DIAGNOSIS — N183 Chronic kidney disease, stage 3 unspecified: Secondary | ICD-10-CM

## 2017-10-14 DIAGNOSIS — Z6827 Body mass index (BMI) 27.0-27.9, adult: Secondary | ICD-10-CM | POA: Diagnosis not present

## 2017-10-14 DIAGNOSIS — I700 Atherosclerosis of aorta: Secondary | ICD-10-CM

## 2017-10-14 DIAGNOSIS — E559 Vitamin D deficiency, unspecified: Secondary | ICD-10-CM

## 2017-10-14 DIAGNOSIS — R42 Dizziness and giddiness: Secondary | ICD-10-CM

## 2017-10-14 DIAGNOSIS — E1122 Type 2 diabetes mellitus with diabetic chronic kidney disease: Secondary | ICD-10-CM | POA: Diagnosis not present

## 2017-10-14 DIAGNOSIS — Z79899 Other long term (current) drug therapy: Secondary | ICD-10-CM | POA: Diagnosis not present

## 2017-10-14 DIAGNOSIS — E782 Mixed hyperlipidemia: Secondary | ICD-10-CM | POA: Diagnosis not present

## 2017-10-14 DIAGNOSIS — R6889 Other general symptoms and signs: Secondary | ICD-10-CM

## 2017-10-14 DIAGNOSIS — Z0000 Encounter for general adult medical examination without abnormal findings: Secondary | ICD-10-CM

## 2017-10-14 DIAGNOSIS — I714 Abdominal aortic aneurysm, without rupture, unspecified: Secondary | ICD-10-CM

## 2017-10-14 DIAGNOSIS — J42 Unspecified chronic bronchitis: Secondary | ICD-10-CM | POA: Diagnosis not present

## 2017-10-14 DIAGNOSIS — Z0001 Encounter for general adult medical examination with abnormal findings: Secondary | ICD-10-CM | POA: Diagnosis not present

## 2017-10-14 DIAGNOSIS — M25512 Pain in left shoulder: Secondary | ICD-10-CM | POA: Diagnosis not present

## 2017-10-14 DIAGNOSIS — I10 Essential (primary) hypertension: Secondary | ICD-10-CM

## 2017-10-14 DIAGNOSIS — I2510 Atherosclerotic heart disease of native coronary artery without angina pectoris: Secondary | ICD-10-CM

## 2017-10-14 DIAGNOSIS — I739 Peripheral vascular disease, unspecified: Secondary | ICD-10-CM

## 2017-10-14 LAB — TROPONIN I: TROPONIN I: 0.02 ng/mL (ref <=0.0)

## 2017-10-14 LAB — CK TOTAL AND CKMB (NOT AT ARMC)
CK, MB: 1.8 ng/mL (ref 0–5.0)
Relative Index: 2.1 (ref 0–4.0)
Total CK: 86 U/L (ref 44–196)

## 2017-10-14 MED ORDER — METFORMIN HCL ER 500 MG PO TB24
ORAL_TABLET | ORAL | 0 refills | Status: DC
Start: 2017-10-14 — End: 2017-12-17

## 2017-10-14 MED ORDER — FENOFIBRATE MICRONIZED 134 MG PO CAPS: ORAL_CAPSULE | ORAL | 1 refills | Status: DC

## 2017-10-14 MED ORDER — TELMISARTAN 40 MG PO TABS: ORAL_TABLET | ORAL | 1 refills | Status: DC

## 2017-10-14 NOTE — Patient Instructions (Signed)
Monitor your blood pressure at home, please keep a record and bring that in with you to your next office visit.   Go to the ER if any CP, SOB, nausea, dizziness, severe HA, changes vision/speech  Due to a recent study, SPRINT, we have changed our goal for the systolic or top blood pressure number. Ideally we want your top number at 120.  In the [facility] Trial, 5000 people were randomized to a goal BP of 120 and 5000 people were randomized to a goal BP of less than 140. The patients with the goal BP at 120 had LESS DEMENTIA, LESS HEART ATTACKS, AND LESS STROKES, AS WELL AS OVERALL DECREASED MORTALITY OR DEATH RATE.   If you are willing, our goal BP is the top number of 120.  Your most recent BP:     Take your medications faithfully as instructed. Maintain a healthy weight. Get at least 150 minutes of aerobic exercise per week. Minimize salt intake. Minimize alcohol intake  DASH Eating Plan DASH stands for "Dietary Approaches to Stop Hypertension." The DASH eating plan is a healthy eating plan that has been shown to reduce high blood pressure (hypertension). Additional health benefits may include reducing the risk of type 2 diabetes mellitus, heart disease, and stroke. The DASH eating plan may also help with weight loss. WHAT DO I NEED TO KNOW ABOUT THE DASH EATING PLAN? For the DASH eating plan, you will follow these general guidelines:  Choose foods with a percent daily value for sodium of less than 5% (as listed on the food label).  Use salt-free seasonings or herbs instead of table salt or sea salt.  Check with your health care provider or pharmacist before using salt substitutes.  Eat lower-sodium products, often labeled as "lower sodium" or "no salt added."  Eat fresh foods.  Eat more vegetables, fruits, and low-fat dairy products.  Choose whole grains. Look for the word "whole" as the first word in the ingredient list.  Choose fish and skinless chicken or [place] more often than  red meat. Limit fish, poultry, and meat to 6 oz (170 g) each day.  Limit sweets, desserts, sugars, and sugary drinks.  Choose heart-healthy fats.  Limit cheese to 1 oz (28 g) per day.  Eat more home-cooked food and less restaurant, buffet, and fast food.  Limit fried foods.  Cook foods using methods other than frying.  Limit canned vegetables. If you do use them, rinse them well to decrease the sodium.  When eating at a restaurant, ask that your food be prepared with less salt, or no salt if possible. WHAT FOODS CAN I EAT? Seek help from a dietitian for individual calorie needs. Grains Whole grain or whole wheat bread. Brown rice. Whole grain or whole wheat pasta. Quinoa, bulgur, and whole grain cereals. Low-sodium cereals. Corn or whole wheat flour tortillas. Whole grain cornbread. Whole grain crackers. Low-sodium crackers. Vegetables Fresh or frozen vegetables (raw, steamed, roasted, or grilled). Low-sodium or reduced-sodium tomato and vegetable juices. Low-sodium or reduced-sodium tomato sauce and paste. Low-sodium or reduced-sodium canned vegetables.  Fruits All fresh, canned (in natural juice), or frozen fruits. Meat and Other Protein Products Ground beef (85% or leaner), grass-fed beef, or beef trimmed of fat. Skinless chicken or [place]. Ground chicken or [place]. Pork trimmed of fat. All fish and seafood. Eggs. Dried beans, peas, or lentils. Unsalted nuts and seeds. Unsalted canned beans. Dairy Low-fat dairy products, such as skim or 1% milk, 2% or reduced-fat cheeses, low-fat ricotta or  cottage cheese, or plain low-fat yogurt. Low-sodium or reduced-sodium cheeses. Fats and Oils Tub margarines without trans fats. Light or reduced-fat mayonnaise and salad dressings (reduced sodium). Avocado. Safflower, olive, or canola oils. Natural peanut or almond butter. Other Unsalted popcorn and pretzels. The items listed above may not be a complete list of recommended foods or beverages.  Contact your dietitian for more options. WHAT FOODS ARE NOT RECOMMENDED? Grains White bread. White pasta. White rice. Refined cornbread. Bagels and croissants. Crackers that contain trans fat. Vegetables Creamed or fried vegetables. Vegetables in a cheese sauce. Regular canned vegetables. Regular canned tomato sauce and paste. Regular tomato and vegetable juices. Fruits Dried fruits. Canned fruit in light or heavy syrup. Fruit juice. Meat and Other Protein Products Fatty cuts of meat. Ribs, chicken wings, bacon, sausage, bologna, salami, chitterlings, fatback, hot dogs, bratwurst, and packaged luncheon meats. Salted nuts and seeds. Canned beans with salt. Dairy Whole or 2% milk, cream, half-and-half, and cream cheese. Whole-fat or sweetened yogurt. Full-fat cheeses or blue cheese. Nondairy creamers and whipped toppings. Processed cheese, cheese spreads, or cheese curds. Condiments Onion and garlic salt, seasoned salt, table salt, and sea salt. Canned and packaged gravies. Worcestershire sauce. Tartar sauce. Barbecue sauce. Teriyaki sauce. Soy sauce, including reduced sodium. Steak sauce. Fish sauce. Oyster sauce. Cocktail sauce. Horseradish. Ketchup and mustard. Meat flavorings and tenderizers. Bouillon cubes. Hot sauce. Tabasco sauce. Marinades. Taco seasonings. Relishes. Fats and Oils Butter, stick margarine, lard, shortening, ghee, and bacon fat. Coconut, palm kernel, or palm oils. Regular salad dressings. Other Pickles and olives. Salted popcorn and pretzels. The items listed above may not be a complete list of foods and beverages to avoid. Contact your dietitian for more information. WHERE CAN I FIND MORE INFORMATION? National Heart, Lung, and Blood Institute: travelstabloid.com Document Released: 06/13/2011 Document Revised: 11/08/2013 Document Reviewed: 04/28/2013 [facility] Patient Information 2015 [place], [place]. This information is not intended to  replace advice given to you by your health care provider. Make sure you discuss any questions you have with your health care provider.

## 2017-10-15 ENCOUNTER — Other Ambulatory Visit: Payer: Self-pay

## 2017-10-15 LAB — CBC WITH DIFFERENTIAL/PLATELET
BASOS PCT: 0.8 %
Basophils Absolute: 60 cells/uL (ref 0–200)
EOS ABS: 90 {cells}/uL (ref 15–500)
Eosinophils Relative: 1.2 %
HEMATOCRIT: 45.7 % (ref 38.5–50.0)
Hemoglobin: 15.6 g/dL (ref 13.2–17.1)
Lymphs Abs: 2595 cells/uL (ref 850–3900)
MCH: 29.6 pg (ref 27.0–33.0)
MCHC: 34.1 g/dL (ref 32.0–36.0)
MCV: 86.7 fL (ref 80.0–100.0)
MPV: 10.5 fL (ref 7.5–12.5)
Monocytes Relative: 7.6 %
NEUTROS PCT: 55.8 %
Neutro Abs: 4185 cells/uL (ref 1500–7800)
Platelets: 242 10*3/uL (ref 140–400)
RBC: 5.27 10*6/uL (ref 4.20–5.80)
RDW: 12.4 % (ref 11.0–15.0)
Total Lymphocyte: 34.6 %
WBC: 7.5 10*3/uL (ref 3.8–10.8)
WBCMIX: 570 {cells}/uL (ref 200–950)

## 2017-10-15 LAB — LIPID PANEL
CHOL/HDL RATIO: 3.2 (calc) (ref <5.0)
Cholesterol: 148 mg/dL (ref <200)
HDL: 46 mg/dL (ref >40)
LDL CHOLESTEROL (CALC): 76 mg/dL
Non-HDL Cholesterol (Calc): 102 mg/dL (calc) (ref <130)
Triglycerides: 155 mg/dL — ABNORMAL HIGH (ref <150)

## 2017-10-15 LAB — BASIC METABOLIC PANEL WITH GFR
BUN/Creatinine Ratio: 13 (calc) (ref 6–22)
BUN: 15 mg/dL (ref 7–25)
CALCIUM: 10.5 mg/dL — AB (ref 8.6–10.3)
CO2: 28 mmol/L (ref 20–32)
Chloride: 98 mmol/L (ref 98–110)
Creat: 1.16 mg/dL — ABNORMAL HIGH (ref 0.70–1.11)
GFR, EST AFRICAN AMERICAN: 68 mL/min/{1.73_m2} (ref >=60)
GFR, EST NON AFRICAN AMERICAN: 59 mL/min/{1.73_m2} — AB (ref >=60)
Glucose, Bld: 194 mg/dL — ABNORMAL HIGH (ref 65–99)
Potassium: 4.5 mmol/L (ref 3.5–5.3)
Sodium: 134 mmol/L — ABNORMAL LOW (ref 135–146)

## 2017-10-15 LAB — HEPATIC FUNCTION PANEL
AG Ratio: 1.6 (calc) (ref 1.0–2.5)
ALBUMIN MSPROF: 4.1 g/dL (ref 3.6–5.1)
ALT: 10 U/L (ref 9–46)
AST: 15 U/L (ref 10–35)
Alkaline phosphatase (APISO): 64 U/L (ref 40–115)
BILIRUBIN DIRECT: 0.2 mg/dL (ref 0.0–0.2)
BILIRUBIN TOTAL: 0.7 mg/dL (ref 0.2–1.2)
GLOBULIN: 2.5 g/dL (ref 1.9–3.7)
Indirect Bilirubin: 0.5 mg/dL (calc) (ref 0.2–1.2)
Total Protein: 6.6 g/dL (ref 6.1–8.1)

## 2017-10-15 LAB — TSH: TSH: 1.6 m[IU]/L (ref 0.40–4.50)

## 2017-10-15 LAB — HEMOGLOBIN A1C
HEMOGLOBIN A1C: 10.3 %{Hb} — AB (ref <5.7)
Mean Plasma Glucose: 249 (calc)
eAG (mmol/L): 13.8 (calc)

## 2017-10-15 LAB — VITAMIN D 25 HYDROXY (VIT D DEFICIENCY, FRACTURES): Vit D, 25-Hydroxy: 38 ng/mL (ref 30–100)

## 2017-10-15 MED ORDER — LOSARTAN POTASSIUM 50 MG PO TABS: ORAL_TABLET | ORAL | 1 refills | Status: DC

## 2017-11-12 ENCOUNTER — Encounter: Payer: Self-pay | Admitting: Adult Health

## 2017-11-12 ENCOUNTER — Ambulatory Visit: Payer: Medicare Other | Admitting: Adult Health

## 2017-11-12 VITALS — BP 132/62 | HR 57 | Temp 97.5°F | Ht 72.5 in | Wt 195.0 lb

## 2017-11-12 DIAGNOSIS — J441 Chronic obstructive pulmonary disease with (acute) exacerbation: Secondary | ICD-10-CM

## 2017-11-12 MED ORDER — PREDNISONE 20 MG PO TABS: ORAL_TABLET | ORAL | 0 refills | Status: DC

## 2017-11-12 MED ORDER — AZITHROMYCIN 250 MG PO TABS: ORAL_TABLET | ORAL | 1 refills | Status: AC

## 2017-11-12 MED ORDER — FLUTICASONE FUROATE-VILANTEROL 100-25 MCG/INH IN AEPB: 1.0000 | INHALATION_SPRAY | Freq: Every day | RESPIRATORY_TRACT | 0 refills | Status: DC

## 2017-11-12 MED ORDER — PROMETHAZINE-DM 6.25-15 MG/5ML PO SYRP: 5.0000 mL | ORAL_SOLUTION | Freq: Four times a day (QID) | ORAL | 1 refills | Status: DC | PRN

## 2017-11-12 NOTE — Progress Notes (Signed)
Assessment and Plan:   COPD with exacerbation ([place]) Continue with mucinex, cough medication at night for sleep Go to the ER if any chest pain, shortness of breath, nausea, dizziness, severe HA, changes vision/speech Follow up if not improving -     predniSONE (DELTASONE) 20 MG tablet; 2 tablets daily for 3 days, 1 tablet daily for 4 days. -     azithromycin (ZITHROMAX) 250 MG tablet; Take 2 tablets (500 mg) on  Day 1,  followed by 1 tablet (250 mg) once daily on Days 2 through 5. -     promethazine-dextromethorphan (PROMETHAZINE-DM) 6.25-15 MG/5ML syrup; Take 5 mLs by mouth 4 (four) times daily as needed for cough.  Inhaler sample provided; reports typically asymptomatic - discussed may be beneficial to have on hand and initiate in the spring when typically has exacerbation:  -     fluticasone furoate-vilanterol (BREO ELLIPTA) 100-25 MCG/INH AEPB; Inhale 1 puff into the lungs daily. Rinse mouth with water after each use  Further disposition pending results of labs. Discussed med's effects and SE's.   Over 15 minutes of exam, counseling, chart review, and critical decision making was performed.   Future Appointments  Date Time Provider [place]  02/11/2018 11:00 AM [name], MD [facility] None  07/02/2018  9:00 AM Liane Comber, NP [facility] None  10/26/2018 11:15 AM Liane Comber, NP [facility] None    ------------------------------------------------------------------------------------------------------------------   HPI 84 y.o.male, previous smoker and dx of COPD presents c/o respiratory symptoms ongoing for 2 weeks now; he reports began as sore throat, chills, nasal congestion, which then settled in his chest with an increasingly productive cough and tightness of chest without dyspnea. He reports he added mucinex without much improvement. He is not currently on inhaler. He reports typically does not have cough, secretions or dyspnea, but has recurrent respiratory  infection every spring which typically responds well to treatment.  Past Medical History:  Diagnosis Date  . Benign prostatic hypertrophy    history of post catheterization  . COPD (chronic obstructive pulmonary disease) ([place])   . Coronary artery disease    s/p PTA and stenting  . Diabetes mellitus   . Hyperlipidemia   . Hypertension   . Kidney stone   . Myocardial infarction ([place])   . Nephrolithiasis    history of cystoscopy  . Vitamin D deficiency      Allergies  Allergen Reactions  . Capoten [Captopril]     Fatigue/depression  . Lamisil [Terbinafine] Rash    Current Outpatient Medications on File Prior to Visit  Medication Sig  . aspirin 81 MG tablet Take 81 mg by mouth daily.  . Cholecalciferol (VITAMIN D-3) 1000 UNITS CAPS Take 2,000 Units by mouth daily.   . fenofibrate micronized (LOFIBRA) 134 MG capsule TAKE 1 CAPSULE (134 MG TOTAL) BY MOUTH DAILY BEFORE BREAKFAST.  [name] glipiZIDE (GLUCOTROL) 5 MG tablet Take 1 tablet (5 mg total) by mouth daily before breakfast.  . losartan (COZAAR) 50 MG tablet Take 1/2 tablet by mouth daily for BP  . Magnesium 400 MG CAPS Take 400 mg by mouth daily.  . meclizine (ANTIVERT) 25 MG tablet 1/2-1 pill up to 3 times daily for motion sickness/dizziness  . metFORMIN (GLUCOPHAGE XR) 500 MG 24 hr tablet Take 1 tablet with breakfast, take 2 tablets at lunch, and take 1 tablet at dinner.  . simvastatin (ZOCOR) 40 MG tablet TAKE 1 TABLET (40 MG TOTAL) BY MOUTH DAILY.   No current facility-administered medications on file prior to visit.  ROS: Review of Systems  Constitutional: Positive for chills. Negative for diaphoresis, fever and malaise/fatigue.  HENT: Positive for congestion and sore throat. Negative for ear discharge, ear pain, hearing loss, sinus pain and tinnitus.   Eyes: Negative for blurred vision, pain, discharge and redness.  Respiratory: Positive for cough and sputum production. Negative for hemoptysis, shortness of breath,  wheezing and stridor.   Cardiovascular: Negative for chest pain, palpitations and orthopnea.  Gastrointestinal: Negative for abdominal pain, diarrhea, nausea and vomiting.  Genitourinary: Negative.   Musculoskeletal: Negative for joint pain and myalgias.  Skin: Negative for rash.  Neurological: Negative for dizziness, sensory change, weakness and headaches.  Endo/Heme/Allergies: Negative for environmental allergies.  Psychiatric/Behavioral: Negative.   All other systems reviewed and are negative.    Physical Exam:  BP 132/62   Pulse (!) 57   Temp (!) 97.5 F (36.4 C)   Ht 6' 0.5" (1.842 m)   Wt 195 lb (88.5 kg)   SpO2 97%   BMI 26.08 kg/m   General Appearance: Well nourished, in no apparent distress. Eyes: PERRLA, EOMs, conjunctiva no swelling or erythema Sinuses: No Frontal/maxillary tenderness ENT/Mouth: Ext aud canals clear, TMs without erythema, bulging. No erythema, swelling, or exudate on post pharynx.  Tonsils not swollen or erythematous. Hearing normal.  Neck: Supple, thyroid normal.  Respiratory: Respiratory effort normal, RS present throughout with scattered rhonchi without wheezing or stridor.  Cardio: RRR with no MRGs. Brisk peripheral pulses without edema.  Abdomen: Soft, + BS.  Non tender. Lymphatics: Non tender without lymphadenopathy.  Musculoskeletal: Symmetrical strength, normal gait.  Skin: Warm, dry without rashes, lesions, ecchymosis.  Psych: Awake and oriented X 3, normal affect, Insight and Judgment appropriate.     Izora Ribas, NP 9:28 AM [facility]

## 2017-11-26 DIAGNOSIS — L819 Disorder of pigmentation, unspecified: Secondary | ICD-10-CM | POA: Diagnosis not present

## 2017-11-26 DIAGNOSIS — D485 Neoplasm of uncertain behavior of skin: Secondary | ICD-10-CM | POA: Diagnosis not present

## 2017-11-26 DIAGNOSIS — C44329 Squamous cell carcinoma of skin of other parts of face: Secondary | ICD-10-CM | POA: Diagnosis not present

## 2017-12-17 ENCOUNTER — Other Ambulatory Visit: Payer: Self-pay | Admitting: *Deleted

## 2017-12-17 ENCOUNTER — Other Ambulatory Visit: Payer: Self-pay | Admitting: Internal Medicine

## 2017-12-17 MED ORDER — METFORMIN HCL ER 500 MG PO TB24: ORAL_TABLET | ORAL | 1 refills | Status: DC

## 2017-12-26 DIAGNOSIS — Z85828 Personal history of other malignant neoplasm of skin: Secondary | ICD-10-CM | POA: Diagnosis not present

## 2017-12-26 DIAGNOSIS — L905 Scar conditions and fibrosis of skin: Secondary | ICD-10-CM | POA: Diagnosis not present

## 2018-01-01 ENCOUNTER — Ambulatory Visit: Payer: Medicare Other | Admitting: Internal Medicine

## 2018-01-01 ENCOUNTER — Encounter: Payer: Self-pay | Admitting: Internal Medicine

## 2018-01-01 VITALS — BP 134/62 | HR 44 | Temp 97.3°F | Resp 18 | Ht 72.5 in | Wt 196.6 lb

## 2018-01-01 DIAGNOSIS — H6523 Chronic serous otitis media, bilateral: Secondary | ICD-10-CM | POA: Diagnosis not present

## 2018-01-01 DIAGNOSIS — J40 Bronchitis, not specified as acute or chronic: Secondary | ICD-10-CM

## 2018-01-01 MED ORDER — PSEUDOEPHEDRINE HCL ER 120 MG PO TB12: ORAL_TABLET | ORAL | 11 refills | Status: DC

## 2018-01-01 MED ORDER — DEXAMETHASONE 0.5 MG PO TABS: ORAL_TABLET | ORAL | 0 refills | Status: DC

## 2018-01-01 MED ORDER — MONTELUKAST SODIUM 10 MG PO TABS: ORAL_TABLET | ORAL | 1 refills | Status: DC

## 2018-01-01 NOTE — Progress Notes (Signed)
  Subjective:    Patient ID: Isaiah Jackson, male    DOB: [birth year], 84 y.o.   MRN: [id number]  HPI    This very nice 84 yo WWM presents with c/o head & chest congestion . Decreased hearing, like head in a barrel. No sneezing/itchy eyes or nose. Clear sputum. No fever, dyspnea.   Medication Sig  . aspirin 81 MG tablet Take 81 mg by mouth daily.  [name] VITAMIN D 1000 UNITS C Take 2,000 Units by mouth daily.   . fenofibrate  134 MG capsule TAKE 1 CAP DAILY   . BREO ELLIPTA 100-25  Inhale 1 puff into the lungs daily  . glipiZIDE  5 MG tablet Take 1 tablet (5 mg total) by mouth daily before breakfast.  . losartan  50 MG tablet Take 1/2 tablet by mouth daily for BP  . Magnesium 400 MG  Take 400 mg by mouth daily.  . metFORMIN-XR 500 MG  TAKE 1 TABLET BY MOUTH WITH BREAKFAST, 2 TABLETS AT LUNCH, AND 1 TABLET AT DINNER  . simvastatin  40 MG tablet TAKE 1 TABLET (40 MG TOTAL) BY MOUTH DAILY.  . meclizine  25 MG tablet 1/2-1 pill up to 3 times daily for motion sickness/dizziness   Allergies  Allergen Reactions  . Capoten [Captopril]     Fatigue/depression  . Lamisil [Terbinafine] Rash   Past Medical History:  Diagnosis Date  . Benign prostatic hypertrophy    history of post catheterization  . COPD (chronic obstructive pulmonary disease) ([place])   . Coronary artery disease    s/p PTA and stenting  . Diabetes mellitus   . Hyperlipidemia   . Hypertension   . Kidney stone   . Myocardial infarction ([place])   . Nephrolithiasis    history of cystoscopy  . Vitamin D deficiency    Review of Systems  10 point systems review negative except as above.    Objective:   Physical Exam  BP 134/62   Pulse (!) 44   Temp (!) 97.3 F (36.3 C)   Resp 18   Ht 6' 0.5" (1.842 m)   Wt 196 lb 9.6 oz (89.2 kg)   BMI 26.30 kg/m   Sl congested cough. No stridor. No rash, cyanosis. HEENT -  EAC's clear. TM's retracted. No sinus tenderness. N/O/P - clear.  Neck - supple.  Chest - Few scattered rales/rhonchi  - clearing w/cough. No wheezes Cor - Nl HS. RRR w/o sig MGR. PP 1(+). No edema. MS- FROM w/o deformities.  Gait Nl. Neuro -  Nl w/o focal abnormalities.    Assessment & Plan:   1. Bilateral chronic serous otitis media  - pseudoephedrine (SUDAFED) 120 MG 12 hr tablet; Take 1 tablet 2 x/day  Am & pm for head & chest congestion  Dispense: 60 tablet; Refill: 11  - montelukast (SINGULAIR) 10 MG tablet; Take 1 tablet daily for Allergies  Dispense: 90 tablet; Refill: 1  - dexamethasone (DECADRON) 0.5 MG tablet; Take 1 tab 3 x day - 3 days, then 2 x day - 3 days, then 1 tab daily  Dispense: 20 tablet;   2. Bronchitis  - montelukast (SINGULAIR) 10 MG tablet; Take 1 tablet daily for Allergies  Dispense: 90 tablet; Refill: 1  - dexamethasone (DECADRON) 0.5 MG tablet; Take 1 tab 3 x day - 3 days, then 2 x day - 3 days, then 1 tab daily  Dispense: 20 tablet

## 2018-01-01 NOTE — Patient Instructions (Signed)

## 2018-01-13 ENCOUNTER — Other Ambulatory Visit: Payer: Self-pay

## 2018-01-13 DIAGNOSIS — Z1212 Encounter for screening for malignant neoplasm of rectum: Secondary | ICD-10-CM

## 2018-01-13 DIAGNOSIS — Z1211 Encounter for screening for malignant neoplasm of colon: Secondary | ICD-10-CM | POA: Diagnosis not present

## 2018-01-13 LAB — POC HEMOCCULT BLD/STL (HOME/3-CARD/SCREEN)
Card #2 Fecal Occult Blod, POC: NEGATIVE
FECAL OCCULT BLD: NEGATIVE
Fecal Occult Blood, POC: NEGATIVE

## 2018-02-03 ENCOUNTER — Encounter: Payer: Self-pay | Admitting: Physician Assistant

## 2018-02-11 ENCOUNTER — Ambulatory Visit: Payer: Medicare Other | Admitting: Internal Medicine

## 2018-02-11 ENCOUNTER — Encounter: Payer: Self-pay | Admitting: Internal Medicine

## 2018-02-11 VITALS — BP 114/64 | HR 64 | Temp 97.2°F | Resp 16 | Ht 72.5 in | Wt 193.2 lb

## 2018-02-11 DIAGNOSIS — E559 Vitamin D deficiency, unspecified: Secondary | ICD-10-CM

## 2018-02-11 DIAGNOSIS — Z79899 Other long term (current) drug therapy: Secondary | ICD-10-CM

## 2018-02-11 DIAGNOSIS — I10 Essential (primary) hypertension: Secondary | ICD-10-CM

## 2018-02-11 DIAGNOSIS — N183 Chronic kidney disease, stage 3 unspecified: Secondary | ICD-10-CM

## 2018-02-11 DIAGNOSIS — E782 Mixed hyperlipidemia: Secondary | ICD-10-CM

## 2018-02-11 DIAGNOSIS — E1122 Type 2 diabetes mellitus with diabetic chronic kidney disease: Secondary | ICD-10-CM | POA: Diagnosis not present

## 2018-02-11 NOTE — Progress Notes (Signed)
This very nice 84 y.o. WWM presents for 6 month follow up with HTN, HLD, T2_DM and Vitamin D Deficiency.      Patient is treated for HTN (1994) & BP has been controlled at home. Today's BP is at goal -114/64.  Patient had a PTCA in 1996 and a Negative Cardiolite in 2006. In 2007 , he has a  Patient has had no complaints of any cardiac type chest pain, palpitations, dyspnea / orthopnea / PND, dizziness, claudication, or dependent edema.     Hyperlipidemia is controlled with diet & meds. Patient denies myalgias or other med SE's. Last Lipids were at goal:  Lab Results  Component Value Date   CHOL 148 10/14/2017   HDL 46 10/14/2017   LDLCALC 76 10/14/2017   TRIG 155 (H) 10/14/2017   CHOLHDL 3.2 10/14/2017      Also, the patient has history of T2_NIDDM (2003) w/ CKD3 (GFR 59)  and has had no symptoms of reactive hypoglycemia, diabetic polys, paresthesias or visual blurring.  Last A1c was  Lab Results  Component Value Date   HGBA1C 10.3 (H) 10/14/2017      Further, the patient also has history of Vitamin D Deficiency ("33" in 2012)  and supplements vitamin D without any suspected side-effects. Last vitamin D was still very low>  Lab Results  Component Value Date   VD25OH 38 10/14/2017   Current Outpatient Medications on File Prior to Visit  Medication Sig  . aspirin 81 MG tablet Take 81 mg by mouth daily.  . Cholecalciferol (VITAMIN D-3) 1000 UNITS CAPS Take 2,000 Units by mouth daily.   . fenofibrate micronized (LOFIBRA) 134 MG capsule TAKE 1 CAPSULE (134 MG TOTAL) BY MOUTH DAILY BEFORE BREAKFAST.  [name] losartan (COZAAR) 50 MG tablet Take 1/2 tablet by mouth daily for BP  . Magnesium 400 MG CAPS Take 400 mg by mouth daily.  . metFORMIN (GLUCOPHAGE-XR) 500 MG 24 hr tablet TAKE 1 TABLET BY MOUTH WITH BREAKFAST, 2 TABLETS AT LUNCH, AND 1 TABLET AT DINNER  . montelukast (SINGULAIR) 10 MG tablet Take 1 tablet daily for Allergies  . simvastatin (ZOCOR) 40 MG tablet TAKE 1 TABLET (40 MG  TOTAL) BY MOUTH DAILY.  [name] glipiZIDE (GLUCOTROL) 5 MG tablet Take 1 tablet (5 mg total) by mouth daily before breakfast.   No current facility-administered medications on file prior to visit.    Allergies  Allergen Reactions  . Capoten [Captopril]     Fatigue/depression  . Lamisil [Terbinafine] Rash   PMHx:   Past Medical History:  Diagnosis Date  . Benign prostatic hypertrophy    history of post catheterization  . COPD (chronic obstructive pulmonary disease) ([place])   . Coronary artery disease    s/p PTA and stenting  . Diabetes mellitus   . Hyperlipidemia   . Hypertension   . Kidney stone   . Myocardial infarction ([place])   . Nephrolithiasis    history of cystoscopy  . Vitamin D deficiency    Immunization History  Administered Date(s) Administered  . Influenza Whole 06/16/2012, 03/26/2013  . Influenza, High Dose Seasonal PF 04/19/2014, 04/26/2015, 04/23/2016, 04/17/2017  . Td 05/26/2003   Past Surgical History:  Procedure Laterality Date  . ABDOMINAL AORTIC ANEURYSM REPAIR  10/23/2005   endograft  . cytoscopy     for kidney stones  . SHOULDER SURGERY Right    FHx:    Reviewed / unchanged  SHx:    Reviewed / unchanged   Systems  Review:  Constitutional: Denies fever, chills, wt changes, headaches, insomnia, fatigue, night sweats, change in appetite. Eyes: Denies redness, blurred vision, diplopia, discharge, itchy, watery eyes.  ENT: Denies discharge, congestion, post nasal drip, epistaxis, sore throat, earache, hearing loss, dental pain, tinnitus, vertigo, sinus pain, snoring.  CV: Denies chest pain, palpitations, irregular heartbeat, syncope, dyspnea, diaphoresis, orthopnea, PND, claudication or edema. Respiratory: denies cough, dyspnea, DOE, pleurisy, hoarseness, laryngitis, wheezing.  Gastrointestinal: Denies dysphagia, odynophagia, heartburn, reflux, water brash, abdominal pain or cramps, nausea, vomiting, bloating, diarrhea, constipation, hematemesis, melena,  hematochezia  or hemorrhoids. Genitourinary: Denies dysuria, frequency, urgency, nocturia, hesitancy, discharge, hematuria or flank pain. Musculoskeletal: Denies arthralgias, myalgias, stiffness, jt. swelling, pain, limping or strain/sprain.  Skin: Denies pruritus, rash, hives, warts, acne, eczema or change in skin lesion(s). Neuro: No weakness, tremor, incoordination, spasms, paresthesia or pain. Psychiatric: Denies confusion, memory loss or sensory loss. Endo: Denies change in weight, skin or hair change.  Heme/Lymph: No excessive bleeding, bruising or enlarged lymph nodes.  Physical Exam  BP 114/64   Pulse 64   Temp (!) 97.2 F (36.2 C)   Resp 16   Ht 6' 0.5" (1.842 m)   Wt 193 lb 3.2 oz (87.6 kg)   BMI 25.84 kg/m   Appears  well nourished, well groomed  and in no distress.  Eyes: PERRLA, EOMs, conjunctiva no swelling or erythema. Sinuses: No frontal/maxillary tenderness ENT/Mouth: EAC's clear, TM's nl w/o erythema, bulging. Nares clear w/o erythema, swelling, exudates. Oropharynx clear without erythema or exudates. Oral hygiene is good. Tongue normal, non obstructing. Hearing intact.  Neck: Supple. Thyroid not palpable. Car 2+/2+ without bruits, nodes or JVD. Chest: Respirations nl with BS clear & equal w/o rales, rhonchi, wheezing or stridor.  Cor: Heart sounds normal w/ regular rate and rhythm without sig. murmurs, gallops, clicks or rubs. Peripheral pulses normal and equal  without edema.  Abdomen: Soft & bowel sounds normal. Non-tender w/o guarding, rebound, hernias, masses or organomegaly.  Lymphatics: Unremarkable.  Musculoskeletal: Full ROM all peripheral extremities, joint stability, 5/5 strength and normal gait.  Skin: Warm, dry without exposed rashes, lesions or ecchymosis apparent.  Neuro: Cranial nerves intact, reflexes equal bilaterally. Sensory-motor testing grossly intact. Tendon reflexes grossly intact.  Pysch: Alert & oriented x 3.  Insight and judgement nl &  appropriate. No ideations.  Assessment and Plan:  1. Essential hypertension  - Continue medication, monitor blood pressure at home.  - Continue DASH diet.  Reminder to go to the ER if any CP,  SOB, nausea, dizziness, severe HA, changes vision/speech.  - CBC with Differential/Platelet - COMPLETE METABOLIC PANEL WITH GFR - Magnesium - Lipid panel  2.  - Continue diet/meds, exercise,& lifestyle modifications.  - Continue monitor periodic cholesterol/liver & renal functions  Hyperlipidemia, mixed  - TSH  3. Type 2 diabetes mellitus with stage 3 chronic kidney disease, without long-term current use of insulin (H[facility])  - Continue diet, exercise, lifestyle modifications.  - Monitor appropriate labs.  - Hemoglobin A1c - Insulin, random  4. Vitamin D deficiency  - Continue supplementation.  - VITAMIN D 25 Hydroxyl  5. Medication management  - CBC with Differential/Platelet - COMPLETE METABOLIC PANEL WITH GFR - Magnesium - Lipid panel - TSH - Hemoglobin A1c - Insulin, random - VITAMIN D 25 Hydroxyl       Discussed  regular exercise, BP monitoring, weight control to achieve/maintain BMI less than 25 and discussed med and SE's. Recommended labs to assess and monitor clinical status with further disposition pending  results of labs. Over 30 minutes of exam, counseling, chart review was performed.

## 2018-02-11 NOTE — Patient Instructions (Signed)

## 2018-02-12 ENCOUNTER — Encounter: Payer: Self-pay | Admitting: *Deleted

## 2018-02-12 ENCOUNTER — Other Ambulatory Visit: Payer: Self-pay | Admitting: Internal Medicine

## 2018-02-12 LAB — COMPLETE METABOLIC PANEL WITH GFR
AG RATIO: 1.6 (calc) (ref 1.0–2.5)
ALT: 11 U/L (ref 9–46)
AST: 14 U/L (ref 10–35)
Albumin: 4.2 g/dL (ref 3.6–5.1)
Alkaline phosphatase (APISO): 64 U/L (ref 40–115)
BUN/Creatinine Ratio: 11 (calc) (ref 6–22)
BUN: 13 mg/dL (ref 7–25)
CALCIUM: 9.9 mg/dL (ref 8.6–10.3)
CHLORIDE: 99 mmol/L (ref 98–110)
CO2: 24 mmol/L (ref 20–32)
Creat: 1.22 mg/dL — ABNORMAL HIGH (ref 0.70–1.11)
GFR, EST AFRICAN AMERICAN: 64 mL/min/{1.73_m2} (ref >=60)
GFR, EST NON AFRICAN AMERICAN: 55 mL/min/{1.73_m2} — AB (ref >=60)
GLOBULIN: 2.6 g/dL (ref 1.9–3.7)
Glucose, Bld: 301 mg/dL — ABNORMAL HIGH (ref 65–99)
POTASSIUM: 4.6 mmol/L (ref 3.5–5.3)
SODIUM: 134 mmol/L — AB (ref 135–146)
TOTAL PROTEIN: 6.8 g/dL (ref 6.1–8.1)
Total Bilirubin: 0.7 mg/dL (ref 0.2–1.2)

## 2018-02-12 LAB — CBC WITH DIFFERENTIAL/PLATELET
BASOS ABS: 74 {cells}/uL (ref 0–200)
BASOS PCT: 1.1 %
EOS ABS: 161 {cells}/uL (ref 15–500)
Eosinophils Relative: 2.4 %
HCT: 48.5 % (ref 38.5–50.0)
Hemoglobin: 16.2 g/dL (ref 13.2–17.1)
Lymphs Abs: 2392 cells/uL (ref 850–3900)
MCH: 29.5 pg (ref 27.0–33.0)
MCHC: 33.4 g/dL (ref 32.0–36.0)
MCV: 88.2 fL (ref 80.0–100.0)
MONOS PCT: 8.6 %
MPV: 10.8 fL (ref 7.5–12.5)
Neutro Abs: 3497 cells/uL (ref 1500–7800)
Neutrophils Relative %: 52.2 %
PLATELETS: 268 10*3/uL (ref 140–400)
RBC: 5.5 10*6/uL (ref 4.20–5.80)
RDW: 12.9 % (ref 11.0–15.0)
TOTAL LYMPHOCYTE: 35.7 %
WBC: 6.7 10*3/uL (ref 3.8–10.8)
WBCMIX: 576 {cells}/uL (ref 200–950)

## 2018-02-12 LAB — INSULIN, RANDOM: INSULIN: 23.8 u[IU]/mL — AB (ref 2.0–19.6)

## 2018-02-12 LAB — HEMOGLOBIN A1C
EAG (MMOL/L): 12.4 (calc)
HEMOGLOBIN A1C: 9.4 %{Hb} — AB (ref <5.7)
MEAN PLASMA GLUCOSE: 223 (calc)

## 2018-02-12 LAB — MAGNESIUM: Magnesium: 1.8 mg/dL (ref 1.5–2.5)

## 2018-02-12 LAB — LIPID PANEL
CHOL/HDL RATIO: 3 (calc) (ref <5.0)
Cholesterol: 128 mg/dL (ref <200)
HDL: 43 mg/dL (ref >40)
LDL Cholesterol (Calc): 62 mg/dL (calc)
NON-HDL CHOLESTEROL (CALC): 85 mg/dL (ref <130)
Triglycerides: 144 mg/dL (ref <150)

## 2018-02-12 LAB — TSH: TSH: 1.88 m[IU]/L (ref 0.40–4.50)

## 2018-02-12 LAB — VITAMIN D 25 HYDROXY (VIT D DEFICIENCY, FRACTURES): Vit D, 25-Hydroxy: 38 ng/mL (ref 30–100)

## 2018-02-12 MED ORDER — GLIPIZIDE 5 MG PO TABS: ORAL_TABLET | ORAL | 1 refills | Status: DC

## 2018-02-24 ENCOUNTER — Other Ambulatory Visit: Payer: Self-pay | Admitting: *Deleted

## 2018-02-24 ENCOUNTER — Telehealth: Payer: Self-pay | Admitting: *Deleted

## 2018-02-24 MED ORDER — GLUCOSE BLOOD VI STRP: ORAL_STRIP | 3 refills | Status: DC

## 2018-02-24 NOTE — Telephone Encounter (Signed)
Patient requested Freestyle Lite Test strips be refilled.  Refill sent to his pharmacy.

## 2018-02-26 ENCOUNTER — Other Ambulatory Visit: Payer: Self-pay | Admitting: *Deleted

## 2018-02-26 MED ORDER — GLUCOSE BLOOD VI STRP: ORAL_STRIP | 3 refills | Status: DC

## 2018-02-27 DIAGNOSIS — E119 Type 2 diabetes mellitus without complications: Secondary | ICD-10-CM | POA: Diagnosis not present

## 2018-04-28 ENCOUNTER — Ambulatory Visit (INDEPENDENT_AMBULATORY_CARE_PROVIDER_SITE_OTHER): Payer: Medicare Other

## 2018-04-28 VITALS — BP 138/70 | HR 62 | Temp 97.5°F | Ht 72.5 in | Wt 192.0 lb

## 2018-04-28 DIAGNOSIS — Z23 Encounter for immunization: Secondary | ICD-10-CM | POA: Diagnosis not present

## 2018-05-28 ENCOUNTER — Other Ambulatory Visit: Payer: Self-pay | Admitting: *Deleted

## 2018-05-28 MED ORDER — SIMVASTATIN 40 MG PO TABS: ORAL_TABLET | ORAL | 1 refills | Status: DC

## 2018-07-02 ENCOUNTER — Encounter: Payer: Self-pay | Admitting: Adult Health

## 2018-07-07 NOTE — Progress Notes (Addendum)
CPE AND FOLLOW UP  Assessment:   Essential hypertension - continue medications, DASH diet, exercise and monitor at home. Call if greater than 130/80.  -     CBC with Differential/Platelet -     CMP/GFR -     TSH  Chronic bronchitis, unspecified chronic bronchitis type ([place]) Has stopped smoking, avoid triggers, no symptoms at this time.  Get CXR for hx of 42 pack year smoker  Type 2 diabetes mellitus with stage 3 chronic kidney disease, without long-term current use of insulin ([place]) Discussed general issues about diabetes pathophysiology and management., Educational material distributed., Suggested low cholesterol diet., Encouraged aerobic exercise., Discussed foot care., Reminded to get yearly retinal exam. -     Hemoglobin A1c  Diabetic neuropathy No pain, monitor feet daily, regular foot exams  Aneurysm of abdominal vessel ([place]) S/p repair in 2007 and has been released by vascular Control blood pressure, cholesterol, glucose, increase exercise.   ASCAD s/p PTCA/Stents Control blood pressure, cholesterol, glucose, increase exercise.  -     TSH -     Lipid panel  Mixed hyperlipidemia -continue medications, check lipids, decrease fatty foods, increase activity.  -     Lipid panel  Vitamin D deficiency Continue supplement  Medication management -     Magnesium  Atherosclerosis of aorta (HCC) Control blood pressure, cholesterol, glucose, increase exercise.   PAD (peripheral artery disease) (HCC) Control blood pressure, cholesterol, glucose, increase exercise.   Chronic bronchitis, unspecified chronic bronchitis type (HCC) No triggers, well controlled symptoms, cont to monitor  Vertigo -nonsmoker, normal neuro, does have history of vertigo in the past, meclizine PRN, exercises given, add bASA, if worsening HA, changes vision/speech, imbalance, weakness go to the ER -     meclizine (ANTIVERT) 25 MG tablet; 1/2-1 pill up to 3 times daily for motion  sickness/dizziness  Conjunctivitis - maxitrol 1 drop q6h bilaterally, hygiene discussed   Orders Placed This Encounter  Procedures  . DG Chest 2 View    Standing Status:   Future    Standing Expiration Date:   09/09/2019    Order Specific Question:   Reason for Exam (SYMPTOM  OR DIAGNOSIS REQUIRED)    Answer:   former smoker, 42 pack year history, htn, screening CXR    Order Specific Question:   Preferred imaging location?    Answer:   [facility]  . CBC with Differential/Platelet  . COMPLETE METABOLIC PANEL WITH GFR  . Magnesium  . Lipid panel  . Hemoglobin A1c  . VITAMIN D 25 Hydroxy (Vit-D Deficiency, Fractures)  . TSH  . Urinalysis, Routine w reflex microscopic  . Microalbumin / creatinine urine ratio  . PSA  . POC Hemoccult Bld/Stl (3-Cd Home Screen)    Standing Status:   Future    Standing Expiration Date:   07/08/2019  . EKG 12-Lead  . HM DIABETES FOOT EXAM    Future Appointments  Date Time Provider [place]  10/26/2018 11:15 AM Liane Comber, NP [facility] None  07/13/2019  9:00 AM Liane Comber, NP [facility] None    Subjective:  Isaiah Jackson is a 84 y.o. male who presents for CPE and 3 month follow up for HTN, hyperlipidemia, diabetes, and vitamin D Def. Has COPD via CXR but denies SOB.  Has history of MI in 1994, PTCA in 1996, negative stress test 2006, has history of AAA s/p released after repair in 2007. Has history of PAD.   Today he reports 3 days of R eye injection, tearing,  AM purulent discharge. As of today, bilateral eyes are injected/irritated. Denies URI sx, fever/chills.   He has history of vertigo, has been on prednisone/meclizine in the past. States when he first gets up in bed in the morning he has vertigo, sits for a while and gets better. When he starts walking around he gets dizzy and feels off balance. No dizziness with standing. Has bilateral ears feel full, no tinnitus. No changes in vision,  No incontinence. Feels weak all  over but no focal weakness. He has been having sinus issues lately due to the season but no fever, chills. No palpitations. He denies any associated neurological complications or symptoms, such as one-sided weakness, numbness, tingling, slurring of speech, droopy face, swallowing difficulties, diplopia.   BMI is Body mass index is 25.31 kg/m., he has been working on diet, cutting down on startches and sugars, He goes dancing once a week for a few hours. He walks during the summer more. Drinks 2 bottles of water daily.  Wt Readings from Last 3 Encounters:  07/09/18 189 lb 3.2 oz (85.8 kg)  04/28/18 192 lb (87.1 kg)  02/11/18 193 lb 3.2 oz (87.6 kg)   His blood pressure has been controlled at home, today their BP is BP: (!) 156/78 He does workout, walks and dances on the weekends. He denies chest pain, shortness of breath, dizziness.   He is on cholesterol medication and denies myalgias. His cholesterol is at goal. The cholesterol last visit was:   Lab Results  Component Value Date   CHOL 128 02/11/2018   HDL 43 02/11/2018   LDLCALC 62 02/11/2018   TRIG 144 02/11/2018   CHOLHDL 3.0 02/11/2018   He has DMII since 2003 w/Stage II CKD.checks his sugars every morning at home runs 120-145 he is on ACE, he is on ASA.[name]  He has been working on diet and exercise for diabetes, and denies paresthesia of the feet, polydipsia, polyuria and visual disturbances. Last A1C in the office was:  Lab Results  Component Value Date   HGBA1C 9.4 (H) 02/11/2018   Patient is on Vitamin D supplement  Lab Results  Component Value Date   VD25OH 38 02/11/2018     Lab Results  Component Value Date   GFRNONAA 24 (L) 02/11/2018   GFRNONAA 59 (L) 10/14/2017   GFRNONAA 61 07/02/2017   Lab Results  Component Value Date   PSA 0.1 01/30/2017   PSA 0.14 12/27/2015   PSA 0.15 11/07/2014       Medication Review: Current Outpatient Medications on File Prior to Visit  Medication Sig Dispense Refill  . aspirin  81 MG tablet Take 81 mg by mouth daily.    . Cholecalciferol (VITAMIN D-3) 1000 UNITS CAPS Take 5,000 Units by mouth daily.    . fenofibrate micronized (LOFIBRA) 134 MG capsule TAKE 1 CAPSULE (134 MG TOTAL) BY MOUTH DAILY BEFORE BREAKFAST. 90 capsule 1  . glipiZIDE (GLUCOTROL) 5 MG tablet Take 1 tablet 3 x /day with meals for Diabetes 270 tablet 1  . glucose blood test strip Check blood sugar 1 time daily-[id number]. 100 each 3  . Magnesium 400 MG CAPS Take 400 mg by mouth daily.    . metFORMIN (GLUCOPHAGE-XR) 500 MG 24 hr tablet TAKE 1 TABLET BY MOUTH WITH BREAKFAST, 2 TABLETS AT LUNCH, AND 1 TABLET AT DINNER 360 tablet 1  . simvastatin (ZOCOR) 40 MG tablet TAKE 1 TABLET (40 MG TOTAL) BY MOUTH DAILY. 90 tablet 1  . montelukast (SINGULAIR) 10 MG  tablet Take 1 tablet daily for Allergies (Patient not taking: Reported on 07/09/2018) 90 tablet 1   No current facility-administered medications on file prior to visit.     Current Problems (verified) Patient Active Problem List   Diagnosis Date Noted  . CKD stage 3 due to type 2 diabetes mellitus ([place]) 10/13/2017  . Vertigo 07/02/2017  . Atherosclerosis of aorta ([place]) 10/11/2016  . PAD (peripheral artery disease) ([place]) 10/11/2016  . Encounter for Medicare annual wellness exam 08/28/2015  . BMI 27.0-27.9,adult 05/25/2015  . ASCAD s/p PTCA/Stents 11/07/2014  . Medication management 10/13/2013  . Type II diabetes mellitus with CKD 3 (GFR 58 ml/min)  (HCC) 10/13/2013  . Hyperlipidemia associated with type 2 diabetes mellitus ([place])   . Hypertension   . Vitamin D deficiency   . COPD (chronic obstructive pulmonary disease) ([place])   . Aneurysm of abdominal vessel ([place]) 11/07/2011    Screening Tests Immunization History  Administered Date(s) Administered  . Influenza Whole 06/16/2012, 03/26/2013  . Influenza, High Dose Seasonal PF 04/19/2014, 04/26/2015, 04/23/2016, 04/17/2017, 04/28/2018  . Td 05/26/2003   Preventative care: Last colonoscopy: 2014  per patient, was advised none further  CXR 2012  Carotid Doppler neg 2013 Stress test 2002  AB aorta 2014  Prior vaccinations: TD or Tdap: 2004, declines further Influenza: 2019 Pneumococcal: 2000, declines Prevnar 13: declines Shingles/Zostavax: declines  Names of Other Physician/Practitioners you currently use: 1.  [facility] 2019 -  need reports - reqeusted 3. , dentist, last visit 2018 - full dentures 4. Dr. Allyson Sabal, derm, last visit 2019, goes q73m  Patient Care Team: [name], MD as PCP - General (Internal Medicine) Jacolyn Reedy, MD as Consulting Physician (Cardiology) Elam Dutch, MD as Consulting Physician (Vascular Surgery) Laurence Spates, MD as Consulting Physician (Gastroenterology) Druscilla Brownie, MD as Consulting Physician (Dermatology)   Allergies Allergies  Allergen Reactions  . Capoten [Captopril]     Fatigue/depression  . Lamisil [Terbinafine] Rash    SURGICAL HISTORY He  has a past surgical history that includes Abdominal aortic aneurysm repair (10/23/2005); cytoscopy; and Shoulder surgery (Right). FAMILY HISTORY His family history includes Diabetes in his daughter, mother, and sister; Heart attack (age of onset: 26) in his mother; Heart disease in his mother. SOCIAL HISTORY He  reports that he quit smoking about 29 years ago. His smoking use included cigarettes. He has a 42.00 pack-year smoking history. He has never used smokeless tobacco. He reports that he does not drink alcohol or use drugs.   Objective:   Blood pressure (!) 156/78, pulse 62, temperature 97.7 F (36.5 C), height 6' 0.5" (1.842 m), weight 189 lb 3.2 oz (85.8 kg), SpO2 98 %. Body mass index is 25.31 kg/m.  General appearance: alert, no distress, WD/WN, male HEENT: normocephalic, sclerae bilaterally mildly erythematous, mild erythema to conjunctiva with scan purulent  discharge to inner canthus, TMs pearly, nares patent, no discharge or erythema, pharynx normal, very HOH Oral cavity: Right posterior pharynx with 67mm white unscrapable lesion without erythema Neck: supple, no masses or adenopathy Heart: RRR, normal S1, S2, no murmurs Lungs: CTA bilaterally, no wheezes, rhonchi, or rales Abdomen: +bs, soft, non tender, non distended, no masses, no hepatomegaly, no splenomegaly Musculoskeletal: no swelling, no obvious deformity Extremities: no edema, no cyanosis, no clubbing Pulses: 2+ symmetric, upper and lower extremities, normal cap refill Neurological: alert, oriented x 3, CN2-12 intact, strength normal upper extremities and lower extremities,  sensation normal throughout, DTRs 2+ throughout, no cerebellar signs, gait normal Skin: several seb keratosis, dry/thin skin  Psychiatric: normal affect, behavior normal, pleasant    EKG: sinus rhythm with PACs, no ST changes  Izora Ribas, NP   07/09/2018

## 2018-07-09 ENCOUNTER — Ambulatory Visit (HOSPITAL_COMMUNITY)
Admission: RE | Admit: 2018-07-09 | Discharge: 2018-07-09 | Disposition: A | Discharge status: Home / Self Care | Payer: Medicare Other | Source: Ambulatory Visit | Attending: Adult Health | Admitting: Adult Health

## 2018-07-09 ENCOUNTER — Other Ambulatory Visit: Payer: Self-pay | Admitting: Adult Health

## 2018-07-09 ENCOUNTER — Encounter: Payer: Self-pay | Admitting: Adult Health

## 2018-07-09 ENCOUNTER — Ambulatory Visit: Payer: Medicare Other | Admitting: Adult Health

## 2018-07-09 VITALS — BP 156/78 | HR 62 | Temp 97.7°F | Ht 72.5 in | Wt 189.2 lb

## 2018-07-09 DIAGNOSIS — N183 Chronic kidney disease, stage 3 unspecified: Secondary | ICD-10-CM

## 2018-07-09 DIAGNOSIS — I739 Peripheral vascular disease, unspecified: Secondary | ICD-10-CM

## 2018-07-09 DIAGNOSIS — Z0000 Encounter for general adult medical examination without abnormal findings: Secondary | ICD-10-CM | POA: Diagnosis not present

## 2018-07-09 DIAGNOSIS — H1033 Unspecified acute conjunctivitis, bilateral: Secondary | ICD-10-CM

## 2018-07-09 DIAGNOSIS — E785 Hyperlipidemia, unspecified: Secondary | ICD-10-CM | POA: Diagnosis not present

## 2018-07-09 DIAGNOSIS — I10 Essential (primary) hypertension: Secondary | ICD-10-CM | POA: Diagnosis not present

## 2018-07-09 DIAGNOSIS — I714 Abdominal aortic aneurysm, without rupture, unspecified: Secondary | ICD-10-CM

## 2018-07-09 DIAGNOSIS — Z87891 Personal history of nicotine dependence: Secondary | ICD-10-CM

## 2018-07-09 DIAGNOSIS — E559 Vitamin D deficiency, unspecified: Secondary | ICD-10-CM

## 2018-07-09 DIAGNOSIS — Z136 Encounter for screening for cardiovascular disorders: Secondary | ICD-10-CM | POA: Diagnosis not present

## 2018-07-09 DIAGNOSIS — Z1211 Encounter for screening for malignant neoplasm of colon: Secondary | ICD-10-CM

## 2018-07-09 DIAGNOSIS — E1141 Type 2 diabetes mellitus with diabetic mononeuropathy: Secondary | ICD-10-CM

## 2018-07-09 DIAGNOSIS — J984 Other disorders of lung: Secondary | ICD-10-CM | POA: Diagnosis not present

## 2018-07-09 DIAGNOSIS — I2510 Atherosclerotic heart disease of native coronary artery without angina pectoris: Secondary | ICD-10-CM

## 2018-07-09 DIAGNOSIS — I700 Atherosclerosis of aorta: Secondary | ICD-10-CM

## 2018-07-09 DIAGNOSIS — E1122 Type 2 diabetes mellitus with diabetic chronic kidney disease: Secondary | ICD-10-CM

## 2018-07-09 DIAGNOSIS — R42 Dizziness and giddiness: Secondary | ICD-10-CM

## 2018-07-09 DIAGNOSIS — Z79899 Other long term (current) drug therapy: Secondary | ICD-10-CM

## 2018-07-09 DIAGNOSIS — Z6827 Body mass index (BMI) 27.0-27.9, adult: Secondary | ICD-10-CM

## 2018-07-09 DIAGNOSIS — J42 Unspecified chronic bronchitis: Secondary | ICD-10-CM | POA: Diagnosis not present

## 2018-07-09 DIAGNOSIS — R918 Other nonspecific abnormal finding of lung field: Secondary | ICD-10-CM

## 2018-07-09 DIAGNOSIS — R911 Solitary pulmonary nodule: Secondary | ICD-10-CM

## 2018-07-09 DIAGNOSIS — E1169 Type 2 diabetes mellitus with other specified complication: Secondary | ICD-10-CM | POA: Diagnosis not present

## 2018-07-09 DIAGNOSIS — Z125 Encounter for screening for malignant neoplasm of prostate: Secondary | ICD-10-CM

## 2018-07-09 MED ORDER — LOSARTAN POTASSIUM 50 MG PO TABS: ORAL_TABLET | ORAL | 1 refills | Status: DC

## 2018-07-09 MED ORDER — NEOMYCIN-POLYMYXIN-DEXAMETH 3.5-10000-0.1 OP SUSP: 1.0000 [drp] | Freq: Four times a day (QID) | OPHTHALMIC | 1 refills | Status: DC

## 2018-07-09 NOTE — Patient Instructions (Addendum)
Increase blood pressure medication to full tab - check blood pressure every day  Please request your eye exam report from the [facility] everyday and call if you have any wounds or ulcers  Eye drops 1 drop in each eye every 6 hours until the redness goes away    Isaiah Jackson , Thank you for taking time to come for your Annual Wellness Visit. I appreciate your ongoing commitment to your health goals. Please review the following plan we discussed and let me know if I can assist you in the future.   These are the goals we discussed: Goals    . Blood Pressure < 140/90       This is a list of the screening recommended for you and due dates:  Health Maintenance  Topic Date Due  . Eye exam for diabetics  08/09/2018*  . Tetanus Vaccine  07/10/2019*  . Pneumonia vaccines (1 of 2 - PCV13) 07/10/2019*  . Hemoglobin A1C  08/14/2018  . Complete foot exam   10/15/2018  . Flu Shot  Completed  *Topic was postponed. The date shown is not the original due date.     Know what a healthy weight is for you (roughly BMI <25) and aim to maintain this  Aim for 7+ servings of fruits and vegetables daily  65-80+ fluid ounces of water or unsweet tea for healthy kidneys  Limit to max 1 drink of alcohol per day; avoid smoking/tobacco  Limit animal fats in diet for cholesterol and heart health - choose grass fed whenever available  Avoid highly processed foods, and foods high in saturated/trans fats  Aim for low stress - take time to unwind and care for your mental health  Aim for 150 min of moderate intensity exercise weekly for heart health, and weights twice weekly for bone health  Aim for 7-9 hours of sleep daily      Diabetes Mellitus and [place] care is an important part of your health, especially when you have diabetes. Diabetes may cause you to have problems because of poor blood flow (circulation) to your feet and legs, which can cause your skin to:  Become thinner and  drier.  Break more easily.  Heal more slowly.  Peel and crack. You may also have nerve damage (neuropathy) in your legs and feet, causing decreased feeling in them. This means that you may not notice minor injuries to your feet that could lead to more serious problems. Noticing and addressing any potential problems early is the best way to prevent future foot problems. How to care for your feet Foot hygiene  Wash your feet daily with warm water and mild soap. Do not use hot water. Then, pat your feet and the areas between your toes until they are completely dry. Do not soak your feet as this can dry your skin.  Trim your toenails straight across. Do not dig under them or around the cuticle. File the edges of your nails with an emery board or nail file.  Apply a moisturizing lotion or petroleum jelly to the skin on your feet and to dry, brittle toenails. Use lotion that does not contain alcohol and is unscented. Do not apply lotion between your toes. Shoes and socks  Wear clean socks or stockings every day. Make sure they are not too tight. Do not wear knee-high stockings since they may decrease blood flow to your legs.  Wear shoes that fit properly and have enough cushioning. Always look  in your shoes before you put them on to be sure there are no objects inside.  To break in new shoes, wear them for just a few hours a day. This prevents injuries on your feet. Wounds, scrapes, corns, and calluses  Check your feet daily for blisters, cuts, bruises, sores, and redness. If you cannot see the bottom of your feet, use a mirror or ask someone for help.  Do not cut corns or calluses or try to remove them with medicine.  If you find a minor scrape, cut, or break in the skin on your feet, keep it and the skin around it clean and dry. You may clean these areas with mild soap and water. Do not clean the area with peroxide, alcohol, or iodine.  If you have a wound, scrape, corn, or callus on your  foot, look at it several times a day to make sure it is healing and not infected. Check for: ? Redness, swelling, or pain. ? Fluid or blood. ? Warmth. ? Pus or a bad smell. General instructions  Do not cross your legs. This may decrease blood flow to your feet.  Do not use heating pads or hot water bottles on your feet. They may burn your skin. If you have lost feeling in your feet or legs, you may not know this is happening until it is too late.  Protect your feet from hot and cold by wearing shoes, such as at the beach or on hot pavement.  Schedule a complete foot exam at least once a year (annually) or more often if you have foot problems. If you have foot problems, report any cuts, sores, or bruises to your health care provider immediately. Contact a health care provider if:  You have a medical condition that increases your risk of infection and you have any cuts, sores, or bruises on your feet.  You have an injury that is not healing.  You have redness on your legs or feet.  You feel burning or tingling in your legs or feet.  You have pain or cramps in your legs and feet.  Your legs or feet are numb.  Your feet always feel cold.  You have pain around a toenail. Get help right away if:  You have a wound, scrape, corn, or callus on your foot and: ? You have pain, swelling, or redness that gets worse. ? You have fluid or blood coming from the wound, scrape, corn, or callus. ? Your wound, scrape, corn, or callus feels warm to the touch. ? You have pus or a bad smell coming from the wound, scrape, corn, or callus. ? You have a fever. ? You have a red line going up your leg. Summary  Check your feet every day for cuts, sores, red spots, swelling, and blisters.  Moisturize feet and legs daily.  Wear shoes that fit properly and have enough cushioning.  If you have foot problems, report any cuts, sores, or bruises to your health care provider immediately.  Schedule a  complete foot exam at least once a year (annually) or more often if you have foot problems. This information is not intended to replace advice given to you by your health care provider. Make sure you discuss any questions you have with your health care provider. Document Released: 06/21/2000 Document Revised: 08/06/2017 Document Reviewed: 07/26/2016 Elsevier Interactive Patient Education  2019 [employer].

## 2018-07-10 LAB — COMPLETE METABOLIC PANEL WITH GFR
AG RATIO: 1.5 (calc) (ref 1.0–2.5)
ALKALINE PHOSPHATASE (APISO): 73 U/L (ref 40–115)
ALT: 11 U/L (ref 9–46)
AST: 15 U/L (ref 10–35)
Albumin: 4.1 g/dL (ref 3.6–5.1)
BILIRUBIN TOTAL: 0.7 mg/dL (ref 0.2–1.2)
BUN / CREAT RATIO: 14 (calc) (ref 6–22)
BUN: 17 mg/dL (ref 7–25)
CHLORIDE: 95 mmol/L — AB (ref 98–110)
CO2: 27 mmol/L (ref 20–32)
Calcium: 10 mg/dL (ref 8.6–10.3)
Creat: 1.2 mg/dL — ABNORMAL HIGH (ref 0.70–1.11)
GFR, EST AFRICAN AMERICAN: 65 mL/min/{1.73_m2} (ref >=60)
GFR, Est Non African American: 56 mL/min/{1.73_m2} — ABNORMAL LOW (ref >=60)
GLUCOSE: 324 mg/dL — AB (ref 65–99)
Globulin: 2.8 g/dL (calc) (ref 1.9–3.7)
POTASSIUM: 4.4 mmol/L (ref 3.5–5.3)
Sodium: 130 mmol/L — ABNORMAL LOW (ref 135–146)
Total Protein: 6.9 g/dL (ref 6.1–8.1)

## 2018-07-10 LAB — HEMOGLOBIN A1C
EAG (MMOL/L): 11.1 (calc)
HEMOGLOBIN A1C: 8.6 %{Hb} — AB (ref <5.7)
Mean Plasma Glucose: 200 (calc)

## 2018-07-10 LAB — VITAMIN D 25 HYDROXY (VIT D DEFICIENCY, FRACTURES): VIT D 25 HYDROXY: 89 ng/mL (ref 30–100)

## 2018-07-10 LAB — MICROALBUMIN / CREATININE URINE RATIO
Creatinine, Urine: 61 mg/dL (ref 20–320)
Microalb Creat Ratio: 51 mcg/mg creat — ABNORMAL HIGH (ref <30)
Microalb, Ur: 3.1 mg/dL

## 2018-07-10 LAB — URINALYSIS, ROUTINE W REFLEX MICROSCOPIC
Bilirubin Urine: NEGATIVE
HGB URINE DIPSTICK: NEGATIVE
Ketones, ur: NEGATIVE
Leukocytes, UA: NEGATIVE
Nitrite: NEGATIVE
Protein, ur: NEGATIVE
Specific Gravity, Urine: 1.03 (ref 1.001–1.03)
pH: <=5 (ref 5.0–8.0)

## 2018-07-10 LAB — CBC WITH DIFFERENTIAL/PLATELET
Absolute Monocytes: 576 cells/uL (ref 200–950)
BASOS ABS: 81 {cells}/uL (ref 0–200)
Basophils Relative: 0.9 %
EOS PCT: 0.9 %
Eosinophils Absolute: 81 cells/uL (ref 15–500)
HEMATOCRIT: 52.9 % — AB (ref 38.5–50.0)
Hemoglobin: 17.1 g/dL (ref 13.2–17.1)
LYMPHS ABS: 2277 {cells}/uL (ref 850–3900)
MCH: 27.4 pg (ref 27.0–33.0)
MCHC: 32.3 g/dL (ref 32.0–36.0)
MCV: 84.9 fL (ref 80.0–100.0)
MPV: 11.1 fL (ref 7.5–12.5)
Monocytes Relative: 6.4 %
NEUTROS PCT: 66.5 %
Neutro Abs: 5985 cells/uL (ref 1500–7800)
Platelets: 272 10*3/uL (ref 140–400)
RBC: 6.23 10*6/uL — ABNORMAL HIGH (ref 4.20–5.80)
RDW: 13.6 % (ref 11.0–15.0)
Total Lymphocyte: 25.3 %
WBC: 9 10*3/uL (ref 3.8–10.8)

## 2018-07-10 LAB — LIPID PANEL
Cholesterol: 135 mg/dL (ref <200)
HDL: 48 mg/dL (ref >40)
LDL Cholesterol (Calc): 65 mg/dL (calc)
NON-HDL CHOLESTEROL (CALC): 87 mg/dL (ref <130)
TRIGLYCERIDES: 137 mg/dL (ref <150)
Total CHOL/HDL Ratio: 2.8 (calc) (ref <5.0)

## 2018-07-10 LAB — TSH: TSH: 1.87 mIU/L (ref 0.40–4.50)

## 2018-07-10 LAB — MAGNESIUM: Magnesium: 1.9 mg/dL (ref 1.5–2.5)

## 2018-07-10 LAB — PSA: PSA: 0.2 ng/mL (ref <=4.0)

## 2018-07-14 ENCOUNTER — Ambulatory Visit
Admission: RE | Admit: 2018-07-14 | Discharge: 2018-07-14 | Disposition: A | Discharge status: Home / Self Care | Payer: Medicare Other | Source: Ambulatory Visit | Attending: Adult Health | Admitting: Adult Health

## 2018-07-14 DIAGNOSIS — R911 Solitary pulmonary nodule: Secondary | ICD-10-CM

## 2018-07-14 DIAGNOSIS — Z87891 Personal history of nicotine dependence: Secondary | ICD-10-CM

## 2018-07-14 DIAGNOSIS — R918 Other nonspecific abnormal finding of lung field: Secondary | ICD-10-CM

## 2018-07-14 DIAGNOSIS — J439 Emphysema, unspecified: Secondary | ICD-10-CM | POA: Diagnosis not present

## 2018-07-14 MED ORDER — IOPAMIDOL (ISOVUE-300) INJECTION 61%
75.0000 mL | Freq: Once | INTRAVENOUS | Status: AC | PRN
  Administered 2018-07-14: 75 mL via INTRAVENOUS

## 2018-07-22 ENCOUNTER — Ambulatory Visit (INDEPENDENT_AMBULATORY_CARE_PROVIDER_SITE_OTHER): Payer: Self-pay | Admitting: Orthopaedic Surgery

## 2018-07-23 ENCOUNTER — Other Ambulatory Visit: Payer: Self-pay | Admitting: Internal Medicine

## 2018-10-20 ENCOUNTER — Other Ambulatory Visit: Payer: Self-pay

## 2018-10-20 ENCOUNTER — Other Ambulatory Visit: Payer: Self-pay | Admitting: Adult Health

## 2018-10-20 MED ORDER — SIMVASTATIN 40 MG PO TABS: ORAL_TABLET | ORAL | 1 refills | Status: DC

## 2018-10-20 MED ORDER — FENOFIBRATE 134 MG PO CAPS: ORAL_CAPSULE | ORAL | 1 refills | Status: DC

## 2018-10-22 NOTE — Progress Notes (Signed)
MEDICARE ANNUAL WELLNESS VISIT AND FOLLOW UP Assessment:   Diagnoses and all orders for this visit:  Encounter for Medicare annual wellness exam  PAD (peripheral artery disease) ([place]) Control blood pressure, cholesterol, glucose, increase exercise.   Essential hypertension Increase to losartan 50 mg daily Monitor blood pressure at home; call if consistently above goal (140/90) Continue DASH diet.   Reminder to go to the ER if any CP, SOB, nausea, dizziness, severe HA, changes vision/speech, left arm numbness and tingling and jaw pain.  Atherosclerosis of aorta (HCC) Control blood pressure, cholesterol, glucose, increase exercise.   ASCAD s/p PTCA/Stents Control blood pressure, cholesterol, glucose, increase exercise.  Followed by cardiology  Aneurysm of abdominal vessel ([place]) S/p repair; control BP; followed by [facility]; stable on follow up imaging and was released by vascular -   Chronic bronchitis, unspecified chronic bronchitis type ([place]) Has stopped smoking, avoid triggers, no symptoms at this time.   Type 2 diabetes mellitus with stage 3 chronic kidney disease, without long-term current use of insulin [facility]) Education: Reviewed 'ABCs' of diabetes management (respective goals in parentheses):  A1C (<7), blood pressure (<130/80), and cholesterol (LDL <70) Continue metformin 500 mg QID, glipizide - change to 1/2 tab TID (currently taking whole tab once daily) Eye Exam yearly and Dental Exam every 6 months - he will request ophth report from [place] Dietary recommendations Physical Activity recommendations  CKD stage 3 due to type 2 diabetes mellitus (HCC) Increase fluids, avoid NSAIDS, monitor sugars, will monitor Check CMP/GFR  Vitamin D deficiency At goal at recent check; continue to recommend supplementation for goal of 70-100 Defer vitamin D level  Vertigo Hasn't had symptoms recently; has meclizine if needed   Medication management CBC, CMP/GFR,  magnesium  Mixed hyperlipidemia Continue medications Continue low cholesterol diet and exercise.  Check lipid panel.   BMI 26 Long discussion about weight loss, diet, and exercise Recommended diet heavy in fruits and veggies and low in animal meats, cheeses, and dairy products, appropriate calorie intake Discussed appropriate weight for height  Follow up at next visit   Over 30 minutes of exam, counseling, chart review, and critical decision making was performed  Future Appointments  Date Time Provider [place]  07/13/2019  9:00 AM Liane Comber, NP [facility] None    Plan:   During the course of the visit the patient was educated and counseled about appropriate screening and preventive services including:    Pneumococcal vaccine   Influenza vaccine  Prevnar 13  Td vaccine  Screening electrocardiogram  Colorectal cancer screening  Diabetes screening  Glaucoma screening  Nutrition counseling    Subjective:  Isaiah Jackson is a 85 y.o. male who presents for Medicare Annual Wellness Visit and 3 month follow up for HTN, hyperlipidemia, T2DM, and vitamin D Def. In 1996 he had PTCA/stents and in 2006 he had a negative Cardiolite. He has hx of AAA s/p stent graft by Dr. Oneida Alar which has been stable on monitoring by [facility].   BMI is Body mass index is 26.62 kg/m., he has been working on diet and exercise. Wt Readings from Last 3 Encounters:  10/26/18 199 lb (90.3 kg)  07/09/18 189 lb 3.2 oz (85.8 kg)  04/28/18 192 lb (87.1 kg)   His blood pressure has reportedly been consistently 140s/70s, today their BP is BP: 140/76 He does workout. He denies chest pain, shortness of breath, dizziness.   He is on cholesterol medication (simvastatin 40 mg every other day) and denies myalgias. His  LDL cholesterol is at goal. The cholesterol last visit was:   Lab Results  Component Value Date   CHOL 135 07/09/2018   HDL 48 07/09/2018   LDLCALC 65 07/09/2018    TRIG 137 07/09/2018   CHOLHDL 2.8 07/09/2018   He has been working on diet and exercise for T2DM (on metformin 500 mg x 4, glipizide 5 mg - prescribed TID, taking daily only), and denies foot ulcerations, hyperglycemia, hypoglycemia , increased appetite, nausea, paresthesia of the feet, polydipsia, polyuria, visual disturbances, vomiting and weight loss. He does check occasional fasting blood sugars, reports running between 130-145. Last A1C in the office was:  Lab Results  Component Value Date   HGBA1C 8.6 (H) 07/09/2018   Last GFR Lab Results  Component Value Date   GFRNONAA 61 (L) 07/09/2018    Patient is on Vitamin D supplement and near goal at last check:    Lab Results  Component Value Date   VD25OH 89 07/09/2018      Medication Review: Current Outpatient Medications on File Prior to Visit  Medication Sig Dispense Refill  . aspirin 81 MG tablet Take 81 mg by mouth daily.    . Cholecalciferol (VITAMIN D-3) 1000 UNITS CAPS Take 5,000 Units by mouth daily.    . fenofibrate micronized (LOFIBRA) 134 MG capsule TAKE 1 CAPSULE(134 MG) ON Tuesday, Thursday, Saturday & Sunday 90 capsule 1  . glucose blood test strip Check blood sugar 1 time daily-DX-E11.9. 100 each 3  . Magnesium 400 MG CAPS Take 400 mg by mouth daily.    . metFORMIN (GLUCOPHAGE-XR) 500 MG 24 hr tablet TAKE 1 TABLET BY MOUTH WITH BREAKFAST, 2 TABLETS AT LUNCH, AND 1 TABLET AT DINNER 360 tablet 1  . simvastatin (ZOCOR) 40 MG tablet TAKE 1 TABLET (40 MG TOTAL) ON Monday, Wednesday & Friday 90 tablet 1   No current facility-administered medications on file prior to visit.     Allergies: Allergies  Allergen Reactions  . Capoten [Captopril]     Fatigue/depression  . Lamisil [Terbinafine] Rash    Current Problems (verified) has Aneurysm of abdominal vessel ([place]); Hyperlipidemia associated with type 2 diabetes mellitus ([place]); Hypertension; Vitamin D deficiency; COPD (chronic obstructive pulmonary disease) ([place]);  Medication management; Type II diabetes mellitus with CKD 3 (GFR 58 ml/min)  (HCC); ASCAD s/p PTCA/Stents; BMI 25.0-25.9,adult; Encounter for Medicare annual wellness exam; Atherosclerosis of aorta ([place]); PAD (peripheral artery disease) ([place]); Vertigo; and CKD stage 3 due to type 2 diabetes mellitus (HCC) on their problem list.  Screening Tests Immunization History  Administered Date(s) Administered  . Influenza Whole 06/16/2012, 03/26/2013  . Influenza, High Dose Seasonal PF 04/19/2014, 04/26/2015, 04/23/2016, 04/17/2017, 04/28/2018  . Td 05/26/2003   Preventative care: Last colonoscopy: 2014 per patient, was advised none further  CXR 2012  Carotid Doppler neg 2013 Stress test 2002  AB aorta 2014  Prior vaccinations: TD or Tdap: 2004, declines further Influenza: 2019 Pneumococcal: 2000, declines Prevnar 13: declines  Shingles/Zostavax: declines  Names of Other Physician/Practitioners you currently use: 1. [facility] here for primary care [facility]. [facility] last [facility] -  need reports - reqeusted 3. , dentist, last visit 2018 - full dentures 4. Dr. Allyson Sabal, derm, last visit 2019, goes q56m  Patient Care Team: [name], MD as PCP - General (Internal Medicine) Jacolyn Reedy, MD as Consulting Physician (Cardiology) Elam Dutch, MD as Consulting Physician (Vascular Surgery) Laurence Spates, MD as Consulting Physician (Gastroenterology) Druscilla Brownie,  MD as Consulting Physician (Dermatology)  Surgical: He  has a past surgical history that includes Abdominal aortic aneurysm repair (10/23/2005); cytoscopy; and Shoulder surgery (Right). Family His family history includes Diabetes in his daughter, mother, and sister; Heart attack (age of onset: 37) in his mother; Heart disease in his mother. Social history  He reports that he quit smoking about 29 years ago. His smoking use included cigarettes. He has a 42.00 pack-year  smoking history. He has never used smokeless tobacco. He reports that he does not drink alcohol or use drugs.  MEDICARE WELLNESS OBJECTIVES: Physical activity: Current Exercise Habits: The patient does not participate in regular exercise at present(active in yard, not going to gym since covid ), Exercise limited by: None identified Cardiac risk factors: Cardiac Risk Factors include: advanced age (>10men, >67 women);male gender;hypertension;dyslipidemia;diabetes mellitus;smoking/ tobacco exposure Depression/mood screen:   Depression screen [facility] 2/9 10/26/2018  Decreased Interest 0  Down, Depressed, Hopeless 0  PHQ - 2 Score 0    ADLs:  In your present state of health, do you have any difficulty performing the following activities: 10/26/2018 02/11/2018  Hearing? Y N  Comment has R hearing aid but forgot, HOH bilaterally -  Vision? N N  Difficulty concentrating or making decisions? N N  Walking or climbing stairs? N N  Dressing or bathing? N N  Doing errands, shopping? N -  Some recent data might be hidden     Cognitive Testing  Alert? Yes  Normal Appearance?Yes  Oriented to person? Yes  Place? Yes   Time? Yes  Recall of three objects?  Yes  Can perform simple calculations? Yes  Displays appropriate judgment?Yes  Can read the correct time from a watch face?Yes  EOL planning: Does Patient Have a Medical Advance Directive?: Yes Type of Advance Directive: Healthcare Power of Attorney, Living will Does patient want to make changes to medical advance directive?: No - Patient declined Copy of [place] in Chart?: No - copy requested   Objective:   Today's Vitals   10/26/18 1059  BP: 140/76  Pulse: (!) 56  Temp: (!) 97 F (36.1 C)  SpO2: 96%  Weight: 199 lb (90.3 kg)  Height: 6' 0.5" (1.842 m)   Body mass index is 26.62 kg/m.  General appearance: alert, no distress, WD/WN, male HEENT: normocephalic, sclerae anicteric, TMs pearly, nares patent, no discharge  or erythema, pharynx normal Oral cavity: MMM, no lesions. HOH, not wearing his hearing aids.  Neck: supple, no lymphadenopathy, no thyromegaly, no masses Heart: RRR, normal S1, S2, no murmurs Lungs: CTA bilaterally, no wheezes, rhonchi, or rales Abdomen: +bs, soft, non tender, non distended, no masses, no hepatomegaly, no splenomegaly Musculoskeletal: nontender, no swelling, no obvious deformity, demonstrates full active ROM of neck, bilateral shoulders, no crepitus, effusion, palpable abnormality to left shoulder. Some ? Tenderness to generalized area without point tenderness.  Extremities: no edema, no cyanosis, no clubbing Pulses: 2+ symmetric, upper and lower extremities, normal cap refill Neurological: alert, oriented x 3, CN2-12 intact, strength normal upper extremities and lower extremities, sensation normal throughout, DTRs 2+ throughout, no cerebellar signs, gait normal Psychiatric: normal affect, behavior normal, pleasant   Medicare Attestation I have personally reviewed: The patient's medical and social history Their use of alcohol, tobacco or illicit drugs Their current medications and supplements The patient's functional ability including ADLs,fall risks, home safety risks, cognitive, and hearing and visual impairment Diet and physical activities Evidence for depression or mood disorders  The patient's weight, height, BMI,  and visual acuity have been recorded in the chart.  I have made referrals, counseling, and provided education to the patient based on review of the above and I have provided the patient with a written personalized care plan for preventive services.     Izora Ribas, NP   10/26/2018

## 2018-10-26 ENCOUNTER — Ambulatory Visit: Payer: Medicare Other | Admitting: Adult Health

## 2018-10-26 ENCOUNTER — Other Ambulatory Visit: Payer: Self-pay

## 2018-10-26 ENCOUNTER — Encounter: Payer: Self-pay | Admitting: Adult Health

## 2018-10-26 VITALS — BP 140/76 | HR 56 | Temp 97.0°F | Ht 72.5 in | Wt 199.0 lb

## 2018-10-26 DIAGNOSIS — E1169 Type 2 diabetes mellitus with other specified complication: Secondary | ICD-10-CM

## 2018-10-26 DIAGNOSIS — I714 Abdominal aortic aneurysm, without rupture, unspecified: Secondary | ICD-10-CM

## 2018-10-26 DIAGNOSIS — I739 Peripheral vascular disease, unspecified: Secondary | ICD-10-CM

## 2018-10-26 DIAGNOSIS — I2510 Atherosclerotic heart disease of native coronary artery without angina pectoris: Secondary | ICD-10-CM | POA: Diagnosis not present

## 2018-10-26 DIAGNOSIS — E785 Hyperlipidemia, unspecified: Secondary | ICD-10-CM

## 2018-10-26 DIAGNOSIS — Z6825 Body mass index (BMI) 25.0-25.9, adult: Secondary | ICD-10-CM

## 2018-10-26 DIAGNOSIS — E1122 Type 2 diabetes mellitus with diabetic chronic kidney disease: Secondary | ICD-10-CM

## 2018-10-26 DIAGNOSIS — I700 Atherosclerosis of aorta: Secondary | ICD-10-CM | POA: Diagnosis not present

## 2018-10-26 DIAGNOSIS — Z79899 Other long term (current) drug therapy: Secondary | ICD-10-CM

## 2018-10-26 DIAGNOSIS — N183 Chronic kidney disease, stage 3 unspecified: Secondary | ICD-10-CM

## 2018-10-26 DIAGNOSIS — J42 Unspecified chronic bronchitis: Secondary | ICD-10-CM

## 2018-10-26 DIAGNOSIS — E559 Vitamin D deficiency, unspecified: Secondary | ICD-10-CM

## 2018-10-26 DIAGNOSIS — R6889 Other general symptoms and signs: Secondary | ICD-10-CM

## 2018-10-26 DIAGNOSIS — R42 Dizziness and giddiness: Secondary | ICD-10-CM

## 2018-10-26 DIAGNOSIS — Z0001 Encounter for general adult medical examination with abnormal findings: Secondary | ICD-10-CM | POA: Diagnosis not present

## 2018-10-26 DIAGNOSIS — Z0000 Encounter for general adult medical examination without abnormal findings: Secondary | ICD-10-CM

## 2018-10-26 DIAGNOSIS — I10 Essential (primary) hypertension: Secondary | ICD-10-CM

## 2018-10-26 MED ORDER — GLIPIZIDE 5 MG PO TABS: ORAL_TABLET | ORAL | 1 refills | Status: DC

## 2018-10-26 MED ORDER — LOSARTAN POTASSIUM 50 MG PO TABS: ORAL_TABLET | ORAL | 1 refills | Status: DC

## 2018-10-26 NOTE — Patient Instructions (Addendum)
Isaiah Jackson , Thank you for taking time to come for your Medicare Wellness Visit. I appreciate your ongoing commitment to your health goals. Please review the following plan we discussed and let me know if I can assist you in the future.   These are the goals we discussed: Goals    . Blood Pressure < 140/90    . DIET - INCREASE WATER INTAKE     Aim for 5-6 bottles of water daily     . Weight (lb) < 195 lb (88.5 kg)       This is a list of the screening recommended for you and due dates:  Health Maintenance  Topic Date Due  . Eye exam for diabetics  11/21/2016  . Tetanus Vaccine  07/10/2019*  . Pneumonia vaccines (1 of 2 - PCV13) 07/10/2019*  . Hemoglobin A1C  01/07/2019  . Flu Shot  02/06/2019  . Complete foot exam   07/10/2019  *Topic was postponed. The date shown is not the original due date.    Bring by copies of advanced directives or living will (anything medically related)  Please request a copy of your diabetic eye exam report from the [place]   Please don't forget to wear your hearing aid to appointments  Increase losartan (blood pressure medicine) to full tab (50 mg) and check blood pressures daily - call if any questions or concerns  Try taking 1/2 tab of glipizide for blood sugars with each meal instead of whole tab daily - call us if any signs of low blood sugars  Goal is fasting (morning blood sugar) to be <130      Hypoglycemia Hypoglycemia is when the sugar (glucose) level in your blood is too low. Signs of low blood sugar may include:  Feeling: ? Hungry. ? Worried or nervous (anxious). ? Sweaty and clammy. ? Confused. ? Dizzy. ? Sleepy. ? Sick to your stomach (nauseous).  Having: ? A fast heartbeat. ? A headache. ? A change in your vision. ? Tingling or no feeling (numbness) around your mouth, lips, or tongue. ? Jerky movements that you cannot control (seizure).  Having trouble with: ? Moving (coordination). ? Sleeping. ? Passing out  (fainting). ? Getting upset easily (irritability). Low blood sugar can happen to people who have diabetes and people who do not have diabetes. Low blood sugar can happen quickly, and it can be an emergency. Treating low blood sugar Low blood sugar is often treated by eating or drinking something sugary right away, such as:  Fruit juice, 4-6 oz (120-150 mL).  Regular soda (not diet soda), 4-6 oz (120-150 mL).  Low-fat milk, 4 oz (120 mL).  Several pieces of hard candy.  Sugar or honey, 1 Tbsp (15 mL). Treating low blood sugar if you have diabetes If you can think clearly and swallow safely, follow the 15:15 rule:  Take 15 grams of a fast-acting carb (carbohydrate). Talk with your doctor about how much you should take.  Always keep a source of fast-acting carb with you, such as: ? Sugar tablets (glucose pills). Take 3-4 pills. ? 6-8 pieces of hard candy. ? 4-6 oz (120-150 mL) of fruit juice. ? 4-6 oz (120-150 mL) of regular (not diet) soda. ? 1 Tbsp (15 mL) honey or sugar.  Check your blood sugar 15 minutes after you take the carb.  If your blood sugar is still at or below 70 mg/dL (3.9 mmol/L), take 15 grams of a carb again.  If your blood sugar does  not go above 70 mg/dL (3.9 mmol/L) after 3 tries, get help right away.  After your blood sugar goes back to normal, eat a meal or a snack within 1 hour.  Treating very low blood sugar If your blood sugar is at or below 54 mg/dL (3 mmol/L), you have very low blood sugar (severe hypoglycemia). This may also cause:  Passing out.  Jerky movements you cannot control (seizure).  Losing consciousness (coma). This is an emergency. Do not wait to see if the symptoms will go away. Get medical help right away. Call your local emergency services (911 in the [place] Do not drive yourself to the hospital. If you have very low blood sugar and you cannot eat or drink, you may need a glucagon shot (injection). A family member or friend should  learn how to check your blood sugar and how to give you a glucagon shot. Ask your doctor if you need to have a glucagon shot kit at home. Follow these instructions at home: General instructions  Take over-the-counter and prescription medicines only as told by your doctor.  Stay aware of your blood sugar as told by your doctor.  Limit alcohol intake to no more than 1 drink a day for nonpregnant women and 2 drinks a day for men. One drink equals 12 oz of beer (355 mL), 5 oz of wine (148 mL), or 1 oz of hard liquor (44 mL).  Keep all follow-up visits as told by your doctor. This is important. If you have diabetes:   Follow your diabetes care plan as told by your doctor. Make sure you: ? Know the signs of low blood sugar. ? Take your medicines as told. ? Follow your exercise and meal plan. ? Eat on time. Do not skip meals. ? Check your blood sugar as often as told by your doctor. Always check it before and after exercise. ? Follow your sick day plan when you cannot eat or drink normally. Make this plan ahead of time with your doctor.  Share your diabetes care plan with: ? Your work or school. ? People you live with.  Check your pee (urine) for ketones: ? When you are sick. ? As told by your doctor.  Carry a card or wear jewelry that says you have diabetes. Contact a doctor if:  You have trouble keeping your blood sugar in your target range.  You have low blood sugar often. Get help right away if:  You still have symptoms after you eat or drink something sugary.  Your blood sugar is at or below 54 mg/dL (3 mmol/L).  You have jerky movements that you cannot control.  You pass out. These symptoms may be an emergency. Do not wait to see if the symptoms will go away. Get medical help right away. Call your local emergency services (911 in the [place] Do not drive yourself to the hospital. Summary  Hypoglycemia happens when the level of sugar (glucose) in your blood is too  low.  Low blood sugar can happen to people who have diabetes and people who do not have diabetes. Low blood sugar can happen quickly, and it can be an emergency.  Make sure you know the signs of low blood sugar and know how to treat it.  Always keep a source of sugar (fast-acting carb) with you to treat low blood sugar. This information is not intended to replace advice given to you by your health care provider. Make sure you discuss any questions you have  with your health care provider. Document Released: 09/18/2009 Document Revised: 12/16/2017 Document Reviewed: 07/28/2015 Elsevier Interactive Patient Education  2019 [employer].

## 2018-10-27 LAB — CBC WITH DIFFERENTIAL/PLATELET
Absolute Monocytes: 602 cells/uL (ref 200–950)
Basophils Absolute: 70 cells/uL (ref 0–200)
Basophils Relative: 1 %
Eosinophils Absolute: 119 cells/uL (ref 15–500)
Eosinophils Relative: 1.7 %
HCT: 51.5 % — ABNORMAL HIGH (ref 38.5–50.0)
Hemoglobin: 17.1 g/dL (ref 13.2–17.1)
Lymphs Abs: 2646 cells/uL (ref 850–3900)
MCH: 27.7 pg (ref 27.0–33.0)
MCHC: 33.2 g/dL (ref 32.0–36.0)
MCV: 83.5 fL (ref 80.0–100.0)
MPV: 10.9 fL (ref 7.5–12.5)
Monocytes Relative: 8.6 %
Neutro Abs: 3563 cells/uL (ref 1500–7800)
Neutrophils Relative %: 50.9 %
Platelets: 246 10*3/uL (ref 140–400)
RBC: 6.17 10*6/uL — ABNORMAL HIGH (ref 4.20–5.80)
RDW: 13.5 % (ref 11.0–15.0)
Total Lymphocyte: 37.8 %
WBC: 7 10*3/uL (ref 3.8–10.8)

## 2018-10-27 LAB — LIPID PANEL
Cholesterol: 157 mg/dL (ref <200)
HDL: 53 mg/dL (ref >=40)
LDL Cholesterol (Calc): 72 mg/dL (calc)
Non-HDL Cholesterol (Calc): 104 mg/dL (calc) (ref <130)
Total CHOL/HDL Ratio: 3 (calc) (ref <5.0)
Triglycerides: 218 mg/dL — ABNORMAL HIGH (ref <150)

## 2018-10-27 LAB — COMPLETE METABOLIC PANEL WITH GFR
AG Ratio: 1.5 (calc) (ref 1.0–2.5)
ALT: 14 U/L (ref 9–46)
AST: 16 U/L (ref 10–35)
Albumin: 4.3 g/dL (ref 3.6–5.1)
Alkaline phosphatase (APISO): 80 U/L (ref 35–144)
BUN/Creatinine Ratio: 14 (calc) (ref 6–22)
BUN: 17 mg/dL (ref 7–25)
CO2: 30 mmol/L (ref 20–32)
Calcium: 11 mg/dL — ABNORMAL HIGH (ref 8.6–10.3)
Chloride: 94 mmol/L — ABNORMAL LOW (ref 98–110)
Creat: 1.23 mg/dL — ABNORMAL HIGH (ref 0.70–1.11)
GFR, Est African American: 63 mL/min/{1.73_m2} (ref >=60)
GFR, Est Non African American: 54 mL/min/{1.73_m2} — ABNORMAL LOW (ref >=60)
Globulin: 2.9 g/dL (calc) (ref 1.9–3.7)
Glucose, Bld: 290 mg/dL — ABNORMAL HIGH (ref 65–99)
Potassium: 5.1 mmol/L (ref 3.5–5.3)
Sodium: 132 mmol/L — ABNORMAL LOW (ref 135–146)
Total Bilirubin: 0.6 mg/dL (ref 0.2–1.2)
Total Protein: 7.2 g/dL (ref 6.1–8.1)

## 2018-10-27 LAB — HEMOGLOBIN A1C
Hgb A1c MFr Bld: 10.6 % of total Hgb — ABNORMAL HIGH (ref <5.7)
Mean Plasma Glucose: 258 (calc)
eAG (mmol/L): 14.3 (calc)

## 2018-10-27 LAB — MAGNESIUM: Magnesium: 1.9 mg/dL (ref 1.5–2.5)

## 2018-10-27 LAB — TSH: TSH: 1.74 mIU/L (ref 0.40–4.50)

## 2018-11-25 NOTE — Progress Notes (Deleted)
Diabetes Education and Follow-Up Visit  85 y.o.male presents for diabetic education. He has Diabetes Mellitus type 2:  with diabetic chronic kidney disease and with other circulatory complications, he {ACTION; IS/IS [id number]} on bASA, and denies {Symptoms; diabetes w/o none:19199}.  Last hemoglobin A1c was: Lab Results  Component Value Date   HGBA1C 10.6 (H) 10/26/2018   HGBA1C 8.6 (H) 07/09/2018   HGBA1C 9.4 (H) 02/11/2018    There is no height or weight on file to calculate BMI.  Pt is on a regimen of: glipizide (generic), Glucophage XR  Was taking metformin 4 tabs, glipizide 5 mg once - advised increase to 1/2 tab TID with meals - ? Increase to full tab with meals  Pt checks his sugars {1-4:31454} x day  Lowest sugar was ***.  He has hypoglycemia awareness.  Highest sugar was ***.  Glucometer:   Exercise:  Patient {DOES NOT does:27190::"does not"} have CKD He {ACTION; IS/IS CBJ:[id number]} on ACE/ARB  Lab Results  Component Value Date   GFRAA 63 10/26/2018      Lab Results  Component Value Date   GFRNONAA 38 (L) 10/26/2018    Lab Results  Component Value Date   CREATININE 1.23 (H) 10/26/2018   BUN 17 10/26/2018   NA 132 (L) 10/26/2018   K 5.1 10/26/2018   CL 94 (L) 10/26/2018   CO2 30 10/26/2018    Lab Results  Component Value Date   MICROALBUR 3.1 07/09/2018     He {ACTION; IS/IS OHY:[id number]} on a Statin.  He {ACTION; IS/IS IRS:85462703} at goal of less than 70.  Lab Results  Component Value Date   CHOL 157 10/26/2018   HDL 53 10/26/2018   LDLCALC 72 10/26/2018   TRIG 218 (H) 10/26/2018   CHOLHDL 3.0 10/26/2018     Problem List has Aneurysm of abdominal vessel ([place]); Hyperlipidemia associated with type 2 diabetes mellitus ([place]); Hypertension; Vitamin D deficiency; COPD (chronic obstructive pulmonary disease) ([place]); Medication management; Type II diabetes mellitus with CKD 3 (GFR 58 ml/min)  (HCC); ASCAD s/p PTCA/Stents; BMI 25.0-25.9,adult;  Encounter for Medicare annual wellness exam; Atherosclerosis of aorta ([place]); PAD (peripheral artery disease) ([place]); Vertigo; and CKD stage 3 due to type 2 diabetes mellitus (HCC) on their problem list.  Medications Current Outpatient Medications on File Prior to Visit  Medication Sig  . aspirin 81 MG tablet Take 81 mg by mouth daily.  . Cholecalciferol (VITAMIN D-3) 1000 UNITS CAPS Take 5,000 Units by mouth daily.  . fenofibrate micronized (LOFIBRA) 134 MG capsule TAKE 1 CAPSULE(134 MG) ON Tuesday, Thursday, Saturday & Sunday  . glipiZIDE (GLUCOTROL) 5 MG tablet Take 1/2-1 tablet 3 x /day as directed with meals for Diabetes  . glucose blood test strip Check blood sugar 1 time daily-DX-E11.9.  . losartan (COZAAR) 50 MG tablet TAKE 1 TABLET BY MOUTH EVERY DAY FOR BLOOD PRESSURE  . Magnesium 400 MG CAPS Take 400 mg by mouth daily.  . metFORMIN (GLUCOPHAGE-XR) 500 MG 24 hr tablet TAKE 1 TABLET BY MOUTH WITH BREAKFAST, 2 TABLETS AT LUNCH, AND 1 TABLET AT DINNER  . simvastatin (ZOCOR) 40 MG tablet TAKE 1 TABLET (40 MG TOTAL) ON Monday, Wednesday & Friday   No current facility-administered medications on file prior to visit.     ROS- see HPI  Physical Exam: There were no vitals taken for this visit. There is no height or weight on file to calculate BMI. General Appearance: Well nourished, in no apparent distress. Eyes: PERRLA, EOMs, conjunctiva no swelling or  erythema ENT/Mouth: Ext aud canals clear, TMs without erythema, bulging. No erythema, swelling, or exudate on post pharynx.  Tonsils not swollen or erythematous. Hearing normal.  Respiratory: Respiratory effort normal, BS equal bilaterally without rales, rhonchi, wheezing or stridor.  Cardio: RRR with no MRGs. Brisk peripheral pulses without edema.  Abdomen: Soft, + BS.  Non tender, no guarding, rebound, hernias, masses. Musculoskeletal: Full ROM, 5/5 strength, normal gait.  Skin: Warm, dry without rashes, lesions, ecchymosis.  Neuro:  Cranial nerves intact. Normal muscle tone, no cerebellar symptoms. Sensation intact.    Plan and Assessment: Diabetes Education: Reviewed 'ABCs' of diabetes management (respective goals in parentheses):  A1C (<7), blood pressure (<130/80), and cholesterol (LDL <70) Eye Exam yearly and Dental Exam every 6 months. Dietary recommendations Physical Activity recommendations - Strongly advised him to start checking sugars at different times of the day - check 2 times a day, rotating checks - given sugar log and advised how to fill it and to bring it at next appt  - given foot care handout and explained the principles  - given instructions for hypoglycemia management    Future Appointments  Date Time Provider [place]  11/26/2018  9:30 AM Liane Comber, NP [facility] None  02/15/2019 11:00 AM [name], MD [facility] None  07/13/2019  9:00 AM Liane Comber, NP [facility] None  11/03/2019 11:15 AM Liane Comber, NP [facility] None

## 2018-11-26 ENCOUNTER — Ambulatory Visit: Payer: Medicare Other | Admitting: Adult Health

## 2018-12-04 NOTE — Progress Notes (Deleted)
Diabetes Education and Follow-Up Visit  85 y.o.male presents for diabetic education. He has Diabetes Mellitus type 2  Last hemoglobin A1c was: Lab Results  Component Value Date   HGBA1C 10.6 (H) 10/26/2018   HGBA1C 8.6 (H) 07/09/2018   HGBA1C 9.4 (H) 02/11/2018    There is no height or weight on file to calculate BMI.  Pt is on a regimen of: Continue metformin 500 mg QID, glipizide - change to 1/2 tab TID (currently taking whole tab once daily)  Pt checks his sugars {1-4:31454} x day  Lowest sugar was ***.  He has hypoglycemia awareness.  Highest sugar was ***.  Glucometer:   Patient does have CKD He is on ACE/ARB ( losartan 100)  Lab Results  Component Value Date   GFRNONAA 54 (L) 10/26/2018    Lab Results  Component Value Date   CREATININE 1.23 (H) 10/26/2018   BUN 17 10/26/2018   NA 132 (L) 10/26/2018   K 5.1 10/26/2018   CL 94 (L) 10/26/2018   CO2 30 10/26/2018    Lab Results  Component Value Date   MICROALBUR 3.1 07/09/2018     He is on a Statin.  He is at goal of less than 70.  Lab Results  Component Value Date   CHOL 157 10/26/2018   HDL 53 10/26/2018   LDLCALC 72 10/26/2018   TRIG 218 (H) 10/26/2018   CHOLHDL 3.0 10/26/2018     Problem List has Aneurysm of abdominal vessel ([place]); Hyperlipidemia associated with type 2 diabetes mellitus ([place]); Hypertension; Vitamin D deficiency; COPD (chronic obstructive pulmonary disease) ([place]); Medication management; Type II diabetes mellitus with CKD 3 (GFR 58 ml/min)  (HCC); ASCAD s/p PTCA/Stents; BMI 25.0-25.9,adult; Encounter for Medicare annual wellness exam; Atherosclerosis of aorta ([place]); PAD (peripheral artery disease) ([place]); Vertigo; and CKD stage 3 due to type 2 diabetes mellitus (HCC) on their problem list.  Medications Current Outpatient Medications on File Prior to Visit  Medication Sig  . aspirin 81 MG tablet Take 81 mg by mouth daily.  . Cholecalciferol (VITAMIN D-3) 1000 UNITS CAPS Take 5,000  Units by mouth daily.  . fenofibrate micronized (LOFIBRA) 134 MG capsule TAKE 1 CAPSULE(134 MG) ON Tuesday, Thursday, Saturday & Sunday  . glipiZIDE (GLUCOTROL) 5 MG tablet Take 1/2-1 tablet 3 x /day as directed with meals for Diabetes  . glucose blood test strip Check blood sugar 1 time daily-DX-E11.9.  . losartan (COZAAR) 50 MG tablet TAKE 1 TABLET BY MOUTH EVERY DAY FOR BLOOD PRESSURE  . Magnesium 400 MG CAPS Take 400 mg by mouth daily.  . metFORMIN (GLUCOPHAGE-XR) 500 MG 24 hr tablet TAKE 1 TABLET BY MOUTH WITH BREAKFAST, 2 TABLETS AT LUNCH, AND 1 TABLET AT DINNER  . simvastatin (ZOCOR) 40 MG tablet TAKE 1 TABLET (40 MG TOTAL) ON Monday, Wednesday & Friday   No current facility-administered medications on file prior to visit.     ROS- see HPI  Physical Exam: There were no vitals taken for this visit. There is no height or weight on file to calculate BMI. General Appearance: Well nourished, in no apparent distress. Eyes: PERRLA, EOMs, conjunctiva no swelling or erythema ENT/Mouth: Ext aud canals clear, TMs without erythema, bulging. No erythema, swelling, or exudate on post pharynx.  Tonsils not swollen or erythematous. Hearing normal.  Respiratory: Respiratory effort normal, BS equal bilaterally without rales, rhonchi, wheezing or stridor.  Cardio: RRR with no MRGs. Brisk peripheral pulses without edema.  Abdomen: Soft, + BS.  Non tender, no  guarding, rebound, hernias, masses. Musculoskeletal: Full ROM, 5/5 strength, normal gait.  Skin: Warm, dry without rashes, lesions, ecchymosis.  Neuro: Cranial nerves intact. Normal muscle tone, no cerebellar symptoms. Sensation intact.    Plan and Assessment: Diabetes Education: Reviewed 'ABCs' of diabetes management (respective goals in parentheses):  A1C (<7), blood pressure (<130/80), and cholesterol (LDL <70) Eye Exam yearly and Dental Exam every 6 months. Dietary recommendations Physical Activity recommendations - Strongly advised him  to start checking sugars at different times of the day - check 2 times a day, rotating checks - given sugar log and advised how to fill it and to bring it at next appt  - given foot care handout and explained the principles  - given instructions for hypoglycemia management    Future Appointments  Date Time Provider [place]  12/08/2018 11:15 AM Vicie Mutters, PA-C [facility] None  02/15/2019 11:00 AM [name], MD [facility] None  07/13/2019  9:00 AM Liane Comber, NP [facility] None  11/03/2019 11:15 AM Liane Comber, NP [facility] None

## 2018-12-08 ENCOUNTER — Ambulatory Visit: Payer: Medicare Other | Admitting: Physician Assistant

## 2018-12-08 ENCOUNTER — Ambulatory Visit: Payer: Medicare Other | Admitting: Adult Health

## 2018-12-14 NOTE — Progress Notes (Signed)
Diabetes Education and Follow-Up Visit  85 y.o.male presents for diabetic education. He has Diabetes Mellitus type 2:  with diabetic chronic kidney disease and with other circulatory complications, he is on bASA, and denies foot ulcerations, hypoglycemia , increased appetite, nausea, paresthesia of the feet, polydipsia, polyuria, visual disturbances and vomiting.  Last hemoglobin A1c was: Lab Results  Component Value Date   HGBA1C 10.6 (H) 10/26/2018   HGBA1C 8.6 (H) 07/09/2018   HGBA1C 9.4 (H) 02/11/2018    Body mass index is 26.32 kg/m.   BMI is Body mass index is 26.32 kg/m., he has not been working on diet and exercise. Wt Readings from Last 3 Encounters:  12/15/18 196 lb 12.8 oz (89.3 kg)  10/26/18 199 lb (90.3 kg)  07/09/18 189 lb 3.2 oz (85.8 kg)    Pt is on a regimen of: glipizide (generic), Glucophage XR   Was taking metformin 4 tabs, glipizide 5 mg once - advised increase to 1/2 tab TID with meals   He admits he stopped taking glipizide after he was taking whole tab 3 times a day, after evening dose would feel "bad" - never checked glucose, but denies tremors, blurry vision  Pt checks his sugars 1 x day  Lowest sugar was 135.  He has hypoglycemia awareness Highest sugar was 240 fasting .  Glucometer: ? Didn't bring, unsure  Exercise: none since covid 19 social distancing, previously was walking 3/4 mile daily in neighborhood, dancing twice a week   He admits he is not watching his diet  Breakfast: oatmeal, cheerios Dinner: [employer] (sandwhich), etc takeout Supper: Can of soup   Patient does have CKD He is on ACE/ARB  Lab Results  Component Value Date   GFRAA 63 10/26/2018      Lab Results  Component Value Date   GFRNONAA 54 (L) 10/26/2018    Lab Results  Component Value Date   CREATININE 1.23 (H) 10/26/2018   BUN 17 10/26/2018   NA 132 (L) 10/26/2018   K 5.1 10/26/2018   CL 94 (L) 10/26/2018   CO2 30 10/26/2018    Lab Results  Component Value  Date   MICROALBUR 3.1 07/09/2018     He is on a Statin.  He is at goal of less than 70.  Lab Results  Component Value Date   CHOL 157 10/26/2018   HDL 53 10/26/2018   LDLCALC 72 10/26/2018   TRIG 218 (H) 10/26/2018   CHOLHDL 3.0 10/26/2018     Problem List has Aneurysm of abdominal vessel ([place]); Hyperlipidemia associated with type 2 diabetes mellitus ([place]); Hypertension; Vitamin D deficiency; COPD (chronic obstructive pulmonary disease) ([place]); Medication management; Type II diabetes mellitus with CKD 3 (GFR 58 ml/min)  (HCC); ASCAD s/p PTCA/Stents; BMI 25.0-25.9,adult; Encounter for Medicare annual wellness exam; Atherosclerosis of aorta ([place]); PAD (peripheral artery disease) ([place]); Vertigo; and CKD stage 3 due to type 2 diabetes mellitus (HCC) on their problem list.  Medications Current Outpatient Medications on File Prior to Visit  Medication Sig  . aspirin 81 MG tablet Take 81 mg by mouth daily.  . Cholecalciferol (VITAMIN D-3) 1000 UNITS CAPS Take 5,000 Units by mouth daily.  . fenofibrate micronized (LOFIBRA) 134 MG capsule TAKE 1 CAPSULE(134 MG) ON Tuesday, Thursday, Saturday & Sunday  . glucose blood test strip Check blood sugar 1 time daily-DX-E11.9.  . losartan (COZAAR) 50 MG tablet TAKE 1 TABLET BY MOUTH EVERY DAY FOR BLOOD PRESSURE  . Magnesium 400 MG CAPS Take 400 mg by mouth  daily.  . metFORMIN (GLUCOPHAGE-XR) 500 MG 24 hr tablet TAKE 1 TABLET BY MOUTH WITH BREAKFAST, 2 TABLETS AT LUNCH, AND 1 TABLET AT DINNER  . simvastatin (ZOCOR) 40 MG tablet TAKE 1 TABLET (40 MG TOTAL) ON Monday, Wednesday & Friday   No current facility-administered medications on file prior to visit.     ROS- see HPI  Physical Exam: Blood pressure 138/78, pulse 64, temperature (!) 97.5 F (36.4 C), weight 196 lb 12.8 oz (89.3 kg), SpO2 95 %. Body mass index is 26.32 kg/m. General Appearance: Well nourished, in no apparent distress. Eyes: PERRLA, EOMs, conjunctiva no swelling or  erythema ENT/Mouth: Ext aud canals clear, TMs without erythema, bulging. No erythema, swelling, or exudate on post pharynx.  Tonsils not swollen or erythematous. Hearing normal.  Respiratory: Respiratory effort normal, BS equal bilaterally without rales, rhonchi, wheezing or stridor.  Cardio: RRR with no MRGs. Brisk peripheral pulses without edema.  Abdomen: Soft, + BS.  Non tender, no guarding, rebound, hernias, masses. Musculoskeletal: Full ROM, 5/5 strength, normal gait.  Skin: Warm, dry without rashes, lesions, ecchymosis.  Neuro: Cranial nerves intact. Normal muscle tone, no cerebellar symptoms. Sensation intact.    Plan and Assessment: Diabetes Education: Reviewed 'ABCs' of diabetes management (respective goals in parentheses):  A1C (<7), blood pressure (<130/80), and cholesterol (LDL <70) Reminded he is due for annual diabetes eye exam Dietary recommendations Physical Activity recommendations - restart walking daily - 20 min Push water intake STOP white bread, alternatives given STOP glimepiride - patient doesn't want to retry this medication, doesn't do well with complex regimen START glimepiride - 2 mg daily with breakfast, he is not confident with slow taper instructions, I will call to advise in 4 days, slowly taper up  Continue checking fasting glucose daily  - given sugar log and advised how to fill it and to bring it at next appt  - given instructions for hypoglycemia management    Comprehensive care plan:  Patient/caregiver was given comprehensive care plan We will continue to monitor these goals every 3 months with an office visit and every month by a telephone call  Patient can contact the office any time with the phone number and get to a provider via the answering service or they can use Mychart.   Verbal permission was received from the patient to review comprehensive care management, they understand they have the right to stop CCM services at any time.   The  patient is self managing medications at home.  Medications were reviewed with the patient today as well as adherence and potential interactions.   The patient does not need home health services at this time.     Future Appointments  Date Time Provider [place]  01/14/2019 10:30 AM Liane Comber, NP [facility] None  02/15/2019 11:00 AM [name], MD [facility] None  07/13/2019  9:00 AM Liane Comber, NP [facility] None  11/03/2019 11:15 AM Liane Comber, NP [facility] None

## 2018-12-15 ENCOUNTER — Encounter: Payer: Self-pay | Admitting: Adult Health

## 2018-12-15 ENCOUNTER — Ambulatory Visit (INDEPENDENT_AMBULATORY_CARE_PROVIDER_SITE_OTHER): Payer: Medicare Other | Admitting: Adult Health

## 2018-12-15 ENCOUNTER — Other Ambulatory Visit: Payer: Self-pay

## 2018-12-15 VITALS — BP 138/78 | HR 64 | Temp 97.5°F | Wt 196.8 lb

## 2018-12-15 DIAGNOSIS — E1169 Type 2 diabetes mellitus with other specified complication: Secondary | ICD-10-CM

## 2018-12-15 DIAGNOSIS — N183 Chronic kidney disease, stage 3 unspecified: Secondary | ICD-10-CM

## 2018-12-15 DIAGNOSIS — Z789 Other specified health status: Secondary | ICD-10-CM | POA: Diagnosis not present

## 2018-12-15 DIAGNOSIS — I2510 Atherosclerotic heart disease of native coronary artery without angina pectoris: Secondary | ICD-10-CM | POA: Diagnosis not present

## 2018-12-15 DIAGNOSIS — I10 Essential (primary) hypertension: Secondary | ICD-10-CM

## 2018-12-15 DIAGNOSIS — E1122 Type 2 diabetes mellitus with diabetic chronic kidney disease: Secondary | ICD-10-CM | POA: Diagnosis not present

## 2018-12-15 DIAGNOSIS — E785 Hyperlipidemia, unspecified: Secondary | ICD-10-CM

## 2018-12-15 MED ORDER — GLIMEPIRIDE 4 MG PO TABS: ORAL_TABLET | ORAL | 2 refills | Status: DC

## 2018-12-15 NOTE — Patient Instructions (Addendum)
Goals    . DIET - INCREASE WATER INTAKE     Aim for 5-6 bottles of water daily     . Exercise - walk 20 min daily    . fasting glucose <150     Check fasting (morning) sugar daily and write down on a log  Check blood sugar later in the day only if feeling bad    . Weight (lb) < 195 lb (88.5 kg)       Try to switch from white/ wheat bread to whole grain bread - try out [employer]   STOP glipizide -   START glimepiride - start with 1/2 tab daily with breakfast -   If fasting sugar is still above 150 in 3 days, increase to whole tab  If still above 150 in 1 week, increase to 1.5 tabs  If still above 150 in 2 weeks increase to 2 whole tabs in the morning.     Glimepiride tablets What is this medicine? GLIMEPIRIDE (GLYE me pye ride) helps to treat type 2 diabetes. Treatment is combined with diet and exercise. This medicine helps your body use insulin better. This medicine may be used for other purposes; ask your health care provider or pharmacist if you have questions. COMMON BRAND NAME(S): Amaryl What should I tell my health care provider before I take this medicine? They need to know if you have any of these conditions: -diabetic ketoacidosis -glucose-6-phosphate dehydrogenase deficiency -heart disease -kidney disease -liver disease -severe infection or injury -thyroid disease -an unusual or allergic reaction to glimepiride, sulfa drugs, other medicines, foods, dyes, or preservatives -pregnancy or recent attempts to get pregnant -breast-feeding How should I use this medicine? Take this medicine by mouth. Swallow with a drink of water. Follow the directions on the prescription label. Take your dose at the same time each day, with breakfast or your first large meal. Do not take more often than directed. Talk to your pediatrician regarding the use of this medicine in children. Special care may be needed. Elderly patients over 63 years old can have a stronger  reaction and need a smaller dose. Overdosage: If you think you have taken too much of this medicine contact a poison control center or emergency room at once. NOTE: This medicine is only for you. Do not share this medicine with others. What if I miss a dose? If you miss a dose, take it as soon as you can. If it is almost time for your next dose, take only that dose. Do not take double or extra doses. What may interact with this medicine? -bosentan -chloramphenicol -cisapride -clarithromycin -medicines for fungal or yeast infections -metoclopramide -probenecid -warfarin Many medications may cause an increase or decrease in blood sugar, these include: -alcohol containing beverages -aspirin and aspirin-like drugs -chloramphenicol -chromium -male hormones, like estrogens or progestins and birth control pills -fluoxetine -heart medicines like disopyramide -isoniazid -male hormones or anabolic steroids -medicines called MAO Inhibitors like Nardil, Parnate, Marplan, Eldepryl -medicines for allergies, asthma, cold, or cough -medicines for mental problems -medicines for weight loss -niacin -NSAIDs, medicines for pain and inflammation, like ibuprofen or naproxen -pentamidine -phenytoin -probenecid -quinolone antibiotics like ciprofloxacin, levofloxacin, ofloxacin -some herbal dietary supplements -steroid medicines like prednisone or cortisone -thyroid medicine -water pills or diuretics This list may not describe all possible interactions. Give your health care provider a list of all the medicines, herbs, non-prescription drugs, or dietary supplements you use. Also tell them if you smoke, drink alcohol, or use illegal  drugs. Some items may interact with your medicine. What should I watch for while using this medicine? Visit your doctor or health care professional for regular checks on your progress. A test called the HbA1C (A1C) will be monitored. This is a simple blood test. It  measures your blood sugar control over the last 2 to 3 months. You will receive this test every 3 to 6 months. Learn how to check your blood sugar. Learn the symptoms of low and high blood sugar and how to manage them. Always carry a quick-source of sugar with you in case you have symptoms of low blood sugar. Examples include hard sugar candy or glucose tablets. Make sure others know that you can choke if you eat or drink when you develop serious symptoms of low blood sugar, such as seizures or unconsciousness. They must get medical help at once. Tell your doctor or health care professional if you have high blood sugar. You might need to change the dose of your medicine. If you are sick or exercising more than usual, you might need to change the dose of your medicine. Do not skip meals. Ask your doctor or health care professional if you should avoid alcohol. Many nonprescription cough and cold products contain sugar or alcohol. These can affect blood sugar. This medicine can make you more sensitive to the sun. Keep out of the sun. If you cannot avoid being in the sun, wear protective clothing and use sunscreen. Do not use sun lamps or tanning beds/booths. Wear a medical ID bracelet or chain, and carry a card that describes your disease and details of your medicine and dosage times. What side effects may I notice from receiving this medicine? Side effects that you should report to your doctor or health care professional as soon as possible: -allergic reactions like skin rash, itching or hives, swelling of the face, lips, or tongue -breathing problems -dark urine -fever, chills, sore throat -signs and symptoms of low blood sugar such as feeling anxious, confusion, dizziness, increased hunger, unusually weak or tired, sweating, shakiness, cold, irritable, headache, blurred vision, fast heartbeat, loss of consciousness -unusual bleeding or bruising -yellowing of the eyes or skin Side effects that usually  do not require medical attention (report to your doctor or health care professional if they continue or are bothersome): -diarrhea -dizziness -headache -heartburn -nausea -stomach gas This list may not describe all possible side effects. Call your doctor for medical advice about side effects. You may report side effects to FDA at 1-800-FDA-1088. Where should I keep my medicine? Keep out of the reach of children. Store at room temperature below 30 degrees C (86 degrees F). Throw away any unused medicine after the expiration date. NOTE: This sheet is a summary. It may not cover all possible information. If you have questions about this medicine, talk to your doctor, pharmacist, or health care provider.  2019 Elsevier/Gold Standard (2012-10-07 14:29:47)      Hypoglycemia Hypoglycemia occurs when the level of sugar (glucose) in the blood is too low. Hypoglycemia can happen in people who do or do not have diabetes. It can develop quickly, and it can be a medical emergency. For most people with diabetes, a blood glucose level below 70 mg/dL (3.9 mmol/L) is considered hypoglycemia. Glucose is a type of sugar that provides the body's main source of energy. Certain hormones (insulin and glucagon) control the level of glucose in the blood. Insulin lowers blood glucose, and glucagon raises blood glucose. Hypoglycemia can result from having  too much insulin in the bloodstream, or from not eating enough food that contains glucose. You may also have reactive hypoglycemia, which happens within 4 hours after eating a meal. What are the causes? Hypoglycemia occurs most often in people who have diabetes and may be caused by:  Diabetes medicine.  Not eating enough, or not eating often enough.  Increased physical activity.  Drinking alcohol on an empty stomach. If you do not have diabetes, hypoglycemia may be caused by:  A tumor in the pancreas.  Not eating enough, or not eating for long periods at a  time (fasting).  A severe infection or illness.  Certain medicines. What increases the risk? Hypoglycemia is more likely to develop in:  People who have diabetes and take medicines to lower blood glucose.  People who abuse alcohol.  People who have a severe illness. What are the signs or symptoms? Mild symptoms Mild hypoglycemia may not cause any symptoms. If you do have symptoms, they may include:  Hunger.  Anxiety.  Sweating and feeling clammy.  Dizziness or feeling light-headed.  Sleepiness.  Nausea.  Increased heart rate.  Headache.  Blurry vision.  Irritability.  Tingling or numbness around the mouth, lips, or tongue.  A change in coordination.  Restless sleep. Moderate symptoms Moderate hypoglycemia can cause:  Mental confusion and poor judgment.  Behavior changes.  Weakness.  Irregular heartbeat. Severe symptoms Severe hypoglycemia is a medical emergency. It can cause:  Fainting.  Seizures.  Loss of consciousness (coma).  Death. How is this diagnosed? Hypoglycemia is diagnosed with a blood test to measure your blood glucose level. This blood test is done while you are having symptoms. Your health care provider may also do a physical exam and review your medical history. How is this treated? This condition can often be treated by immediately eating or drinking something that contains sugar, such as:  Fruit juice, 4-6 oz (120-150 mL).  Regular soda (not diet soda), 4-6 oz (120-150 mL).  Low-fat milk, 4 oz (120 mL).  Several pieces of hard candy.  Sugar or honey, 1 Tbsp (15 mL). Treating hypoglycemia if you have diabetes If you are alert and able to swallow safely, follow the 15:15 rule:  Take 15 grams of a rapid-acting carbohydrate. Talk with your health care provider about how much you should take.  Rapid-acting options include: ? Glucose pills (take 15 grams). ? 6-8 pieces of hard candy. ? 4-6 oz (120-150 mL) of fruit  juice. ? 4-6 oz (120-150 mL) of regular (not diet) soda. ? 1 Tbsp (15 mL) honey or sugar.  Check your blood glucose 15 minutes after you take the carbohydrate.  If the repeat blood glucose level is still at or below 70 mg/dL (3.9 mmol/L), take 15 grams of a carbohydrate again.  If your blood glucose level does not increase above 70 mg/dL (3.9 mmol/L) after 3 tries, seek emergency medical care.  After your blood glucose level returns to normal, eat a meal or a snack within 1 hour.  Treating severe hypoglycemia Severe hypoglycemia is when your blood glucose level is at or below 54 mg/dL (3 mmol/L). Severe hypoglycemia is a medical emergency. Get medical help right away. If you have severe hypoglycemia and you cannot eat or drink, you may need an injection of glucagon. A family member or close friend should learn how to check your blood glucose and how to give you a glucagon injection. Ask your health care provider if you need to have an emergency glucagon  injection kit available. Severe hypoglycemia may need to be treated in a hospital. The treatment may include getting glucose through an IV. You may also need treatment for the cause of your hypoglycemia. Follow these instructions at home:  General instructions  Take over-the-counter and prescription medicines only as told by your health care provider.  Monitor your blood glucose as told by your health care provider.  Limit alcohol intake to no more than 1 drink a day for nonpregnant women and 2 drinks a day for men. One drink equals 12 oz of beer (355 mL), 5 oz of wine (148 mL), or 1 oz of hard liquor (44 mL).  Keep all follow-up visits as told by your health care provider. This is important. If you have diabetes:  Always have a rapid-acting carbohydrate snack with you to treat low blood glucose.  Follow your diabetes management plan as directed. Make sure you: ? Know the symptoms of hypoglycemia. It is important to treat it right  away to prevent it from becoming severe. ? Take your medicines as directed. ? Follow your exercise plan. ? Follow your meal plan. Eat on time, and do not skip meals. ? Check your blood glucose as often as directed. Always check before and after exercise. ? Follow your sick day plan whenever you cannot eat or drink normally. Make this plan in advance with your health care provider.  Share your diabetes management plan with people in your workplace, school, and household.  Check your urine for ketones when you are ill and as told by your health care provider.  Carry a medical alert card or wear medical alert jewelry. Contact a health care provider if:  You have problems keeping your blood glucose in your target range.  You have frequent episodes of hypoglycemia. Get help right away if:  You continue to have hypoglycemia symptoms after eating or drinking something containing glucose.  Your blood glucose is at or below 54 mg/dL (3 mmol/L).  You have a seizure.  You faint. These symptoms may represent a serious problem that is an emergency. Do not wait to see if the symptoms will go away. Get medical help right away. Call your local emergency services (911 in the [place] Summary  Hypoglycemia occurs when the level of sugar (glucose) in the blood is too low.  Hypoglycemia can happen in people who do or do not have diabetes. It can develop quickly, and it can be a medical emergency.  Make sure you know the symptoms of hypoglycemia and how to treat it.  Always have a rapid-acting carbohydrate snack with you to treat low blood sugar. This information is not intended to replace advice given to you by your health care provider. Make sure you discuss any questions you have with your health care provider. Document Released: 06/24/2005 Document Revised: 12/16/2017 Document Reviewed: 07/28/2015 Elsevier Interactive Patient Education  2019 [employer].

## 2018-12-18 ENCOUNTER — Telehealth: Payer: Self-pay | Admitting: Adult Health

## 2018-12-18 NOTE — Telephone Encounter (Signed)
85 y.o. male with poorly controlled T2DM and limited health literacy, recently seen for diaabetes education visit and initiated on glimepiride was called to follow up for glucose and further medication titration. He reports he has initiated glimepiride, taking 1/2 tab (2 mg) with breakfast. He reports fasting glucose yesterday was 195, PM glucose was 169. Fasting glucose this AM was 159. Advised he increase glipizide to full tab (4 mg) and continue to check BID. Will call back next week to follow up. Advised to call sooner with any low glucose readings.

## 2018-12-23 DIAGNOSIS — E119 Type 2 diabetes mellitus without complications: Secondary | ICD-10-CM | POA: Diagnosis not present

## 2018-12-25 ENCOUNTER — Telehealth: Payer: Self-pay | Admitting: Adult Health

## 2018-12-25 ENCOUNTER — Encounter: Payer: Self-pay | Admitting: Adult Health

## 2018-12-25 NOTE — Telephone Encounter (Signed)
85 y.o. male with poorly controlled T2DM and limited health literacy, enrolled in CCM this month, recently seen for diabetes education visit and initiated on glimepiride was called to follow up for glucose and further medication titration. He report he has increased glimepiride, taking full tab (1 mg) with breakfast.    Fasting glucose Glucose after dinner  6/11 195 162  6/12 159 299  6/13 138 232  6/14 126 197  6/15 128 195  6/16 121 145  6/17 136 185  6/18 109 182  6/19 134  -    Appears significant improvement in fasting glucose over the last week, and at goal most days. Education provided that ideal PM time to check would be prior to dinner, 2 hours after dinner, or at bedtime. He denies any episodes of hypoglycemia, is able to verbalize to me symptoms to expect and appropriate response. Advised to call if having any episodes. He expresses his appreciation for today's follow up, denies further needs at this time, no medication concerns or refills needed.    Goals    . DIET - INCREASE WATER INTAKE     Aim for 5-6 bottles of water daily     . Exercise - walk 20 min daily    . fasting glucose <150     Check fasting (morning) sugar daily and write down on a log  Check blood sugar later in the day only if feeling bad    . Weight (lb) < 195 lb (88.5 kg)       Current Outpatient Medications on File Prior to Visit  Medication Sig Dispense Refill  . aspirin 81 MG tablet Take 81 mg by mouth daily.    . Cholecalciferol (VITAMIN D-3) 1000 UNITS CAPS Take 5,000 Units by mouth daily.    . fenofibrate micronized (LOFIBRA) 134 MG capsule TAKE 1 CAPSULE(134 MG) ON Tuesday, Thursday, Saturday & Sunday 90 capsule 1  . glimepiride (AMARYL) 4 MG tablet Start by taking 1/2 tab daily with breakfast for blood sugar. If fasting blood sugars still 150+ in 1 week, increase to whole tab daily. 60 tablet 2  . glucose blood test strip Check blood sugar 1 time daily-[id number]. 100 each 3  . losartan  (COZAAR) 50 MG tablet TAKE 1 TABLET BY MOUTH EVERY DAY FOR BLOOD PRESSURE 90 tablet 1  . Magnesium 400 MG CAPS Take 400 mg by mouth daily.    . metFORMIN (GLUCOPHAGE-XR) 500 MG 24 hr tablet TAKE 1 TABLET BY MOUTH WITH BREAKFAST, 2 TABLETS AT LUNCH, AND 1 TABLET AT DINNER 360 tablet 1  . simvastatin (ZOCOR) 40 MG tablet TAKE 1 TABLET (40 MG TOTAL) ON Monday, Wednesday & Friday 90 tablet 1   No current facility-administered medications on file prior to visit.

## 2019-01-01 ENCOUNTER — Telehealth: Payer: Self-pay | Admitting: Adult Health

## 2019-01-01 NOTE — Telephone Encounter (Signed)
85 y.o. male with poorly controlled T2DM and limited health literacy, enrolled in CCM this month, recently seen for diabetes education visit and initiated on glimepiride was called to follow up for glucose and further medication titration. He report he has increased glimepiride, taking full tab (4 mg) with breakfast since last week.    Fasting glucose Glucose after dinner  6/19 134 139  6/20 115 105  6/21 119 111  6/22 98 114  6/23 112 102  6/24 110 205  6/25 114 131  6/26 112  -    Appears significant improvement in fasting glucose over the last week, and at goal consistently. He denies any episodes of hypoglecemia, is able to verbalize to me expected symptoms and appropriate response. Reminded to call with any concerns. In light of excellent reported home glucose readings, do not feel he needs previously scheduled 1 month follow up. Will cancel and have him follow up for routine 3 MOV in August and will recheck A1C at that time.    Goals    . DIET - INCREASE WATER INTAKE     Aim for 5-6 bottles of water daily     . Exercise - walk 20 min daily    . fasting glucose <150     Check fasting (morning) sugar daily and write down on a log  Check blood sugar later in the day only if feeling bad    . Weight (lb) < 195 lb (88.5 kg)       Current Outpatient Medications on File Prior to Visit  Medication Sig Dispense Refill  . aspirin 81 MG tablet Take 81 mg by mouth daily.    . Cholecalciferol (VITAMIN D-3) 1000 UNITS CAPS Take 5,000 Units by mouth daily.    . fenofibrate micronized (LOFIBRA) 134 MG capsule TAKE 1 CAPSULE(134 MG) ON Tuesday, Thursday, Saturday & Sunday 90 capsule 1  . glimepiride (AMARYL) 4 MG tablet Start by taking 1/2 tab daily with breakfast for blood sugar. If fasting blood sugars still 150+ in 1 week, increase to whole tab daily. 60 tablet 2  . glucose blood test strip Check blood sugar 1 time daily-[id number]. 100 each 3  . losartan (COZAAR) 50 MG tablet TAKE 1 TABLET  BY MOUTH EVERY DAY FOR BLOOD PRESSURE 90 tablet 1  . Magnesium 400 MG CAPS Take 400 mg by mouth daily.    . metFORMIN (GLUCOPHAGE-XR) 500 MG 24 hr tablet TAKE 1 TABLET BY MOUTH WITH BREAKFAST, 2 TABLETS AT LUNCH, AND 1 TABLET AT DINNER 360 tablet 1  . simvastatin (ZOCOR) 40 MG tablet TAKE 1 TABLET (40 MG TOTAL) ON Monday, Wednesday & Friday 90 tablet 1   No current facility-administered medications on file prior to visit.

## 2019-01-12 ENCOUNTER — Other Ambulatory Visit: Payer: Self-pay | Admitting: Internal Medicine

## 2019-01-12 DIAGNOSIS — E1122 Type 2 diabetes mellitus with diabetic chronic kidney disease: Secondary | ICD-10-CM

## 2019-01-12 MED ORDER — METFORMIN HCL ER 500 MG PO TB24: ORAL_TABLET | ORAL | 3 refills | Status: DC

## 2019-01-14 ENCOUNTER — Ambulatory Visit: Payer: Medicare Other | Admitting: Adult Health

## 2019-02-05 ENCOUNTER — Ambulatory Visit: Payer: Self-pay | Admitting: Adult Health

## 2019-02-05 NOTE — Progress Notes (Signed)
CCM telephone visit  Attempted to contact this patient x 2 times this month for CCM follow up and was never able to complete the call, going to voice mail at each attempt. At last follow up he was doing very well with new glimepiride dose and demonstrated understanding of hypoglycemic symptoms and appropriate response. Will continue to attempt to reach out monthly for CCM follow up.   Izora Ribas, NP   Future Appointments  Date Time Provider [place]  02/15/2019 11:00 AM [name], MD [facility] None  07/13/2019  9:00 AM Liane Comber, NP [facility] None  11/03/2019 11:15 AM Liane Comber, NP [facility] None

## 2019-02-14 ENCOUNTER — Encounter: Payer: Self-pay | Admitting: Internal Medicine

## 2019-02-14 NOTE — Patient Instructions (Signed)

## 2019-02-14 NOTE — Progress Notes (Signed)
History of Present Illness:      This very nice 85 y.o.  WWMNpresents for 3 month follow up with HTN, HLD, T2_NIDDM, BPH/LUTS  and Vitamin D Deficiency.       Patient is treated for HTN circa 1994 & BP has been controlled at home. Todays BP: 120/76.  In 1996, he underwent PTCA and in 2006 had a negative Cardiolite. In 2007, he underwent repair of an AAA. Patient has had no complaints of any cardiac type chest pain, palpitations, dyspnea / orthopnea / PND, dizziness, claudication, or dependent edema.      Hyperlipidemia is controlled with diet & meds. Patient denies myalgias or other med SEs. Last Lipids were at goal albeit elevated Trig's: Lab Results  Component Value Date   CHOL 157 10/26/2018   HDL 53 10/26/2018   LDLCALC 72 10/26/2018   TRIG 218 (H) 10/26/2018   CHOLHDL 3.0 10/26/2018       Also, the patient has history of T2_NIDDM (2003) w/CKD3 and has had no symptoms of reactive hypoglycemia, diabetic polys, paresthesias or visual blurring.  Last A1c was not at goal: Lab Results  Component Value Date   HGBA1C 10.6 (H) 10/26/2018       Further, the patient also has history of Vitamin D Deficiency ("33" / 2012)  and supplements vitamin D without any suspected side-effects. Last vitamin D was at goal: Lab Results  Component Value Date   VD25OH 89 07/09/2018   Current Outpatient Medications on File Prior to Visit  Medication Sig   aspirin 81 MG tablet Take 81 mg by mouth daily.   Cholecalciferol (VITAMIN D-3) 1000 UNITS CAPS Take 5,000 Units by mouth daily.   fenofibrate micronized (LOFIBRA) 134 MG capsule TAKE 1 CAPSULE(134 MG) ON Tuesday, Thursday, Saturday & Sunday   glimepiride (AMARYL) 4 MG tablet Start by taking 1/2 tab daily with breakfast for blood sugar. If fasting blood sugars still 150+ in 1 week, increase to whole tab daily.   glucose blood test strip Check blood sugar 1 time daily-[id number].   losartan (COZAAR) 50 MG tablet TAKE 1 TABLET BY MOUTH EVERY  DAY FOR BLOOD PRESSURE   Magnesium 400 MG CAPS Take 400 mg by mouth daily.   metFORMIN (GLUCOPHAGE-XR) 500 MG 24 hr tablet Take 1 tablet with Breakfast & Lunch & 2 tablets with Supper for Diabetes   simvastatin (ZOCOR) 40 MG tablet TAKE 1 TABLET (40 MG TOTAL) ON Monday, Wednesday & Friday   No current facility-administered medications on file prior to visit.    Allergies  Allergen Reactions   Capoten [Captopril]     Fatigue/depression   Lamisil [Terbinafine] Rash   PMHx:   Past Medical History:  Diagnosis Date   Benign prostatic hypertrophy    history of post catheterization   COPD (chronic obstructive pulmonary disease) ([place])    Coronary artery disease    s/p PTA and stenting   Diabetes mellitus    Hyperlipidemia    Hypertension    Kidney stone    [facility]ocardial [facility]    Nephrolithiasis    history of cystoscopy   Vitamin D deficiency    Immunization History  Administered Date(s) Administered   Influenza Whole 06/16/2012, 03/26/2013   Influenza, High Dose Seasonal PF 04/19/2014, 04/26/2015, 04/23/2016, 04/17/2017, 04/28/2018   Td 05/26/2003   Past Surgical History:  Procedure Laterality Date   ABDOMINAL AORTIC ANEURYSM REPAIR  10/23/2005   endograft   cytoscopy     for kidney  stones   SHOULDER SURGERY Right    FHx:    Reviewed / unchanged  SHx:    Reviewed / unchanged   Systems Review:  Constitutional: Denies fever, chills, wt changes, headaches, insomnia, fatigue, night sweats, change in appetite. Eyes: Denies redness, blurred vision, diplopia, discharge, itchy, watery eyes.  ENT: Denies discharge, congestion, post nasal drip, epistaxis, sore throat, earache, hearing loss, dental pain, tinnitus, vertigo, sinus pain, snoring.  CV: Denies chest pain, palpitations, irregular heartbeat, syncope, dyspnea, diaphoresis, orthopnea, PND, claudication or edema. Respiratory: denies cough, dyspnea, DOE, pleurisy, hoarseness, laryngitis, wheezing.   Gastrointestinal: Denies dysphagia, odynophagia, heartburn, reflux, water brash, abdominal pain or cramps, nausea, vomiting, bloating, diarrhea, constipation, hematemesis, melena, hematochezia  or hemorrhoids. Genitourinary: Denies dysuria, frequency, urgency, nocturia, hesitancy, discharge, hematuria or flank pain. Musculoskeletal: Denies arthralgias, myalgias, stiffness, jt. swelling, pain, limping or strain/sprain.  Skin: Denies pruritus, rash, hives, warts, acne, eczema or change in skin lesion(s). Neuro: No weakness, tremor, incoordination, spasms, paresthesia or pain. Psychiatric: Denies confusion, memory loss or sensory loss. Endo: Denies change in weight, skin or hair change.  Heme/Lymph: No excessive bleeding, bruising or enlarged lymph nodes.  Physical Exam  BP 120/76    Pulse 68    Temp 97.6 F (36.4 C)    Resp 16    Ht 6' 0.5" (1.842 m)    Wt 201 lb 12.8 oz (91.5 kg)    BMI 26.99 kg/m   Appears  well nourished, well groomed  and in no distress.  Eyes: PERRLA, EOMs, conjunctiva no swelling or erythema. Sinuses: No frontal/maxillary tenderness ENT/Mouth: EAC's clear, TM's nl w/o erythema, bulging. Nares clear w/o erythema, swelling, exudates. Oropharynx clear without erythema or exudates. Oral hygiene is good. Tongue normal, non obstructing. Hearing intact.  Neck: Supple. Thyroid not palpable. Car 2+/2+ without bruits, nodes or JVD. Chest: Respirations nl with BS clear & equal w/o rales, rhonchi, wheezing or stridor.  Cor: Heart sounds normal w/ regular rate and rhythm without sig. murmurs, gallops, clicks or rubs. Peripheral pulses normal and equal  without edema.  Abdomen: Soft & bowel sounds normal. Non-tender w/o guarding, rebound, hernias, masses or organomegaly.  Lymphatics: Unremarkable.  Musculoskeletal: Full ROM all peripheral extremities, joint stability, 5/5 strength and normal gait.  Skin: Warm, dry without exposed rashes, lesions or ecchymosis apparent.  Neuro:  Cranial nerves intact, reflexes equal bilaterally. Sensory-motor testing grossly intact. Tendon reflexes grossly intact.  Pysch: Alert & oriented x 3.  Insight and judgement nl & appropriate. No ideations.  Assessment and Plan:  1. Essential hypertension  - Continue medication, monitor blood pressure at home.  - Continue DASH diet.  Reminder to go to the ER if any CP,  SOB, nausea, dizziness, severe HA, changes vision/speech.  - CBC with Differential/Platelet - COMPLETE METABOLIC PANEL WITH GFR - Magnesium - TSH  2. Hyperlipidemia, mixed  - Continue diet/meds, exercise,& lifestyle modifications.  - Continue monitor periodic cholesterol/liver & renal functions   - Lipid panel - TSH  3. Type 2 diabetes mellitus with stage 3 chronic kidney disease, without long-term current use of insulin (HCC)  - Continue diet, exercise  - Lifestyle modifications.  - Monitor appropriate labs.  - Hemoglobin A1c - Insulin, random  4. Vitamin D deficiency  - Continue supplementation.  - VITAMIN D 25 Hydroxyl  5. ASCAD s/p PTCA/Stents  - Lipid panel  6. Medication management  - CBC with Differential/Platelet - COMPLETE METABOLIC PANEL WITH GFR - Magnesium - Lipid panel - TSH - Hemoglobin A1c -  Insulin, random - VITAMIN D 25 Hydroxyl        Discussed  regular exercise, BP monitoring, weight control to achieve/maintain BMI less than 25 and discussed med and SE's. Recommended labs to assess and monitor clinical status with further disposition pending results of labs.  I discussed the assessment and treatment plan with the patient. The patient was provided an opportunity to ask questions and all were answered. The patient agreed with the plan and demonstrated an understanding of the instructions.  I provided over 30 minutes of exam, counseling, chart review and  complex critical decision making.   Kirtland Bouchard, MD

## 2019-02-15 ENCOUNTER — Ambulatory Visit (INDEPENDENT_AMBULATORY_CARE_PROVIDER_SITE_OTHER): Payer: Medicare Other | Admitting: Internal Medicine

## 2019-02-15 ENCOUNTER — Other Ambulatory Visit: Payer: Self-pay

## 2019-02-15 VITALS — BP 120/76 | HR 68 | Temp 97.6°F | Resp 16 | Ht 72.5 in | Wt 201.8 lb

## 2019-02-15 DIAGNOSIS — I2510 Atherosclerotic heart disease of native coronary artery without angina pectoris: Secondary | ICD-10-CM

## 2019-02-15 DIAGNOSIS — E782 Mixed hyperlipidemia: Secondary | ICD-10-CM

## 2019-02-15 DIAGNOSIS — N183 Chronic kidney disease, stage 3 unspecified: Secondary | ICD-10-CM

## 2019-02-15 DIAGNOSIS — I10 Essential (primary) hypertension: Secondary | ICD-10-CM | POA: Diagnosis not present

## 2019-02-15 DIAGNOSIS — E559 Vitamin D deficiency, unspecified: Secondary | ICD-10-CM | POA: Diagnosis not present

## 2019-02-15 DIAGNOSIS — Z79899 Other long term (current) drug therapy: Secondary | ICD-10-CM

## 2019-02-15 DIAGNOSIS — E1122 Type 2 diabetes mellitus with diabetic chronic kidney disease: Secondary | ICD-10-CM | POA: Diagnosis not present

## 2019-02-16 LAB — COMPLETE METABOLIC PANEL WITH GFR
AG Ratio: 1.3 (calc) (ref 1.0–2.5)
ALT: 17 U/L (ref 9–46)
AST: 21 U/L (ref 10–35)
Albumin: 4 g/dL (ref 3.6–5.1)
Alkaline phosphatase (APISO): 67 U/L (ref 35–144)
BUN/Creatinine Ratio: 14 (calc) (ref 6–22)
BUN: 17 mg/dL (ref 7–25)
CO2: 27 mmol/L (ref 20–32)
Calcium: 10.1 mg/dL (ref 8.6–10.3)
Chloride: 98 mmol/L (ref 98–110)
Creat: 1.21 mg/dL — ABNORMAL HIGH (ref 0.70–1.11)
GFR, Est African American: 64 mL/min/{1.73_m2} (ref >=60)
GFR, Est Non African American: 55 mL/min/{1.73_m2} — ABNORMAL LOW (ref >=60)
Globulin: 3.1 g/dL (calc) (ref 1.9–3.7)
Glucose, Bld: 183 mg/dL — ABNORMAL HIGH (ref 65–99)
Potassium: 4.5 mmol/L (ref 3.5–5.3)
Sodium: 134 mmol/L — ABNORMAL LOW (ref 135–146)
Total Bilirubin: 0.6 mg/dL (ref 0.2–1.2)
Total Protein: 7.1 g/dL (ref 6.1–8.1)

## 2019-02-16 LAB — HEMOGLOBIN A1C
Hgb A1c MFr Bld: 8.4 % of total Hgb — ABNORMAL HIGH (ref <5.7)
Mean Plasma Glucose: 194 (calc)
eAG (mmol/L): 10.8 (calc)

## 2019-02-16 LAB — CBC WITH DIFFERENTIAL/PLATELET
Absolute Monocytes: 593 cells/uL (ref 200–950)
Basophils Absolute: 39 cells/uL (ref 0–200)
Basophils Relative: 0.5 %
Eosinophils Absolute: 131 cells/uL (ref 15–500)
Eosinophils Relative: 1.7 %
HCT: 51.7 % — ABNORMAL HIGH (ref 38.5–50.0)
Hemoglobin: 17 g/dL (ref 13.2–17.1)
Lymphs Abs: 2680 cells/uL (ref 850–3900)
MCH: 28.2 pg (ref 27.0–33.0)
MCHC: 32.9 g/dL (ref 32.0–36.0)
MCV: 85.7 fL (ref 80.0–100.0)
MPV: 10.5 fL (ref 7.5–12.5)
Monocytes Relative: 7.7 %
Neutro Abs: 4258 cells/uL (ref 1500–7800)
Neutrophils Relative %: 55.3 %
Platelets: 238 10*3/uL (ref 140–400)
RBC: 6.03 10*6/uL — ABNORMAL HIGH (ref 4.20–5.80)
RDW: 14.5 % (ref 11.0–15.0)
Total Lymphocyte: 34.8 %
WBC: 7.7 10*3/uL (ref 3.8–10.8)

## 2019-02-16 LAB — LIPID PANEL
Cholesterol: 148 mg/dL (ref <200)
HDL: 46 mg/dL (ref >=40)
LDL Cholesterol (Calc): 74 mg/dL (calc)
Non-HDL Cholesterol (Calc): 102 mg/dL (calc) (ref <130)
Total CHOL/HDL Ratio: 3.2 (calc) (ref <5.0)
Triglycerides: 183 mg/dL — ABNORMAL HIGH (ref <150)

## 2019-02-16 LAB — MAGNESIUM: Magnesium: 1.9 mg/dL (ref 1.5–2.5)

## 2019-02-16 LAB — VITAMIN D 25 HYDROXY (VIT D DEFICIENCY, FRACTURES): Vit D, 25-Hydroxy: 51 ng/mL (ref 30–100)

## 2019-02-16 LAB — INSULIN, RANDOM: Insulin: 34.1 u[IU]/mL — ABNORMAL HIGH

## 2019-02-16 LAB — TSH: TSH: 1.88 mIU/L (ref 0.40–4.50)

## 2019-03-31 ENCOUNTER — Ambulatory Visit (INDEPENDENT_AMBULATORY_CARE_PROVIDER_SITE_OTHER): Payer: Medicare Other

## 2019-03-31 ENCOUNTER — Other Ambulatory Visit: Payer: Self-pay

## 2019-03-31 VITALS — Temp 97.3°F

## 2019-03-31 DIAGNOSIS — Z23 Encounter for immunization: Secondary | ICD-10-CM | POA: Diagnosis not present

## 2019-03-31 NOTE — Progress Notes (Signed)
Patient presents to the office forHDFlu Vaccine. Vaccine administered toLEFTDeltoid withoutanycomplication. Temperature taken and recorded 

## 2019-04-12 ENCOUNTER — Other Ambulatory Visit: Payer: Self-pay | Admitting: Internal Medicine

## 2019-04-12 DIAGNOSIS — E782 Mixed hyperlipidemia: Secondary | ICD-10-CM

## 2019-04-12 MED ORDER — FENOFIBRATE 134 MG PO CAPS: ORAL_CAPSULE | ORAL | 3 refills | Status: DC

## 2019-04-14 ENCOUNTER — Other Ambulatory Visit: Payer: Self-pay | Admitting: Internal Medicine

## 2019-04-14 DIAGNOSIS — E782 Mixed hyperlipidemia: Secondary | ICD-10-CM

## 2019-04-14 MED ORDER — FENOFIBRATE 134 MG PO CAPS: ORAL_CAPSULE | ORAL | 3 refills | Status: DC

## 2019-05-20 ENCOUNTER — Encounter: Payer: Self-pay | Admitting: Internal Medicine

## 2019-05-20 ENCOUNTER — Other Ambulatory Visit: Payer: Self-pay

## 2019-05-20 ENCOUNTER — Ambulatory Visit (INDEPENDENT_AMBULATORY_CARE_PROVIDER_SITE_OTHER): Payer: Medicare Other | Admitting: Internal Medicine

## 2019-05-20 VITALS — BP 140/82 | HR 72 | Temp 97.3°F | Resp 16 | Ht 72.5 in | Wt 204.2 lb

## 2019-05-20 DIAGNOSIS — E559 Vitamin D deficiency, unspecified: Secondary | ICD-10-CM | POA: Diagnosis not present

## 2019-05-20 DIAGNOSIS — Z79899 Other long term (current) drug therapy: Secondary | ICD-10-CM

## 2019-05-20 DIAGNOSIS — B029 Zoster without complications: Secondary | ICD-10-CM

## 2019-05-20 DIAGNOSIS — N1831 Chronic kidney disease, stage 3a: Secondary | ICD-10-CM

## 2019-05-20 DIAGNOSIS — E1121 Type 2 diabetes mellitus with diabetic nephropathy: Secondary | ICD-10-CM | POA: Diagnosis not present

## 2019-05-20 DIAGNOSIS — I10 Essential (primary) hypertension: Secondary | ICD-10-CM | POA: Diagnosis not present

## 2019-05-20 DIAGNOSIS — I739 Peripheral vascular disease, unspecified: Secondary | ICD-10-CM

## 2019-05-20 DIAGNOSIS — E1122 Type 2 diabetes mellitus with diabetic chronic kidney disease: Secondary | ICD-10-CM

## 2019-05-20 DIAGNOSIS — E785 Hyperlipidemia, unspecified: Secondary | ICD-10-CM | POA: Diagnosis not present

## 2019-05-20 DIAGNOSIS — I2510 Atherosclerotic heart disease of native coronary artery without angina pectoris: Secondary | ICD-10-CM

## 2019-05-20 DIAGNOSIS — E1169 Type 2 diabetes mellitus with other specified complication: Secondary | ICD-10-CM | POA: Diagnosis not present

## 2019-05-20 MED ORDER — PREDNISONE 20 MG PO TABS: ORAL_TABLET | ORAL | 0 refills | Status: DC

## 2019-05-20 MED ORDER — GABAPENTIN 100 MG PO CAPS: ORAL_CAPSULE | ORAL | 2 refills | Status: DC

## 2019-05-20 NOTE — Patient Instructions (Signed)

## 2019-05-20 NOTE — Progress Notes (Signed)
History of Present Illness:      This very nice 85 y.o. WWM presents for 3 month follow up with HTN, HLD, Pre-Diabetes and Vitamin D Deficiency.       Today, he's c/o of a 2-3 day burning/sting, itching discomfort / pain over his Rt posterior shoulder.       Patient is treated for HTN  (1994) & BP has been controlled at home. Today's BP was initially slightly elevated & rechecked at goal - 140/82.  He had PTCA in 1996 and a negative Cardiolite in 2006. Then in 2007, he underwent AAA repair and has doe well since. Patient has had no complaints of any cardiac type chest pain, palpitations, dyspnea / orthopnea / PND, dizziness, claudication, or dependent edema.      Hyperlipidemia is controlled with diet & meds. Patient denies myalgias or other med SE's. Current  Lipids are at goal:  Lab Results  Component Value Date   CHOL 132 05/20/2019   HDL 45 05/20/2019   LDLCALC 65 05/20/2019   TRIG 135 05/20/2019   CHOLHDL 2.9 05/20/2019        Also, the patient has history of T2_NIDDM (2003) w/CKD3 and reports fasting CBG's range betw 80-115 mg%.  He denies symptoms of reactive hypoglycemia, diabetic polys, paresthesias or visual blurring.  Current A1c is not at goal:  Lab Results  Component Value Date   HGBA1C 7.4 (H) 05/20/2019       Further, the patient also has history of Vitamin D Deficiency ("33" / 2012)  and supplements vitamin D without any suspected side-effects.  Current Vitamin D was near goal (70-100):  Lab Results  Component Value Date   VD25OH 57 05/20/2019    Current Outpatient Medications on File Prior to Visit  Medication Sig  . aspirin 81 MG tablet Take 81 mg by mouth daily.  . Cholecalciferol (VITAMIN D-3) 1000 UNITS CAPS Take 5,000 Units by mouth daily.  . fenofibrate micronized (LOFIBRA) 134 MG capsule Take 1 capsule Daily for Triglycerides (Blood Fats)  . glimepiride (AMARYL) 4 MG tablet Start by taking 1/2 tab daily with breakfast for blood sugar. If fasting  blood sugars still 150+ in 1 week, increase to whole tab daily.  [name] glucose blood (FREESTYLE LITE) test strip USE TO CHECK BLOOD SUGARS  ONE TIME DAILY  . losartan (COZAAR) 50 MG tablet TAKE 1 TABLET BY MOUTH EVERY DAY FOR BLOOD PRESSURE  . Magnesium 400 MG CAPS Take 400 mg by mouth daily.  . metFORMIN (GLUCOPHAGE-XR) 500 MG 24 hr tablet Take 1 tablet with Breakfast & Lunch & 2 tablets with Supper for Diabetes  . simvastatin (ZOCOR) 40 MG tablet TAKE 1 TABLET (40 MG TOTAL) ON Monday, Wednesday & Friday   No current facility-administered medications on file prior to visit.    Allergies  Allergen Reactions  . Capoten [Captopril]     Fatigue/depression  . Lamisil [Terbinafine] Rash   PMHx:   Past Medical History:  Diagnosis Date  . Benign prostatic hypertrophy    history of post catheterization  . COPD (chronic obstructive pulmonary disease) ([place])   . Coronary artery disease    s/p PTA and stenting  . Diabetes mellitus   . Hyperlipidemia   . Hypertension   . Kidney stone   . Myocardial infarction ([place])   . Nephrolithiasis    history of cystoscopy  . Vitamin D deficiency    Immunization History  Administered Date(s) Administered  . Influenza Whole  06/16/2012, 03/26/2013  . Influenza, High Dose Seasonal PF 04/19/2014, 04/26/2015, 04/23/2016, 04/17/2017, 04/28/2018, 03/31/2019  . Td 05/26/2003   Past Surgical History:  Procedure Laterality Date  . ABDOMINAL AORTIC ANEURYSM REPAIR  10/23/2005   endograft  . cytoscopy     for kidney stones  . SHOULDER SURGERY Right    FHx:    Reviewed / unchanged  SHx:    Reviewed / unchanged   Systems Review:  Constitutional: Denies fever, chills, wt changes, headaches, insomnia, fatigue, night sweats, change in appetite. Eyes: Denies redness, blurred vision, diplopia, discharge, itchy, watery eyes.  ENT: Denies discharge, congestion, post nasal drip, epistaxis, sore throat, earache, hearing loss, dental pain, tinnitus, vertigo, sinus  pain, snoring.  CV: Denies chest pain, palpitations, irregular heartbeat, syncope, dyspnea, diaphoresis, orthopnea, PND, claudication or edema. Respiratory: denies cough, dyspnea, DOE, pleurisy, hoarseness, laryngitis, wheezing.  Gastrointestinal: Denies dysphagia, odynophagia, heartburn, reflux, water brash, abdominal pain or cramps, nausea, vomiting, bloating, diarrhea, constipation, hematemesis, melena, hematochezia  or hemorrhoids. Genitourinary: Denies dysuria, frequency, urgency, nocturia, hesitancy, discharge, hematuria or flank pain. Musculoskeletal: Denies arthralgias, myalgias, stiffness, jt. swelling, pain, limping or strain/sprain.  Skin: Denies pruritus, rash, hives, warts, acne, eczema or change in skin lesion(s). Neuro: No weakness, tremor, incoordination, spasms, paresthesia or pain. Psychiatric: Denies confusion, memory loss or sensory loss. Endo: Denies change in weight, skin or hair change.  Heme/Lymph: No excessive bleeding, bruising or enlarged lymph nodes.  Physical Exam  BP 140/82   Pulse 72   Temp (!) 97.3 F (36.3 C)   Resp 16   Ht 6' 0.5" (1.842 m)   Wt 204 lb 3.2 oz (92.6 kg)   BMI 27.31 kg/m   Appears  well nourished, well groomed  and in no distress.  Eyes: PERRLA, EOMs, conjunctiva no swelling or erythema. Sinuses: No frontal/maxillary tenderness ENT/Mouth: EAC's clear, TM's nl w/o erythema, bulging. Nares clear w/o erythema, swelling, exudates. Oropharynx clear without erythema or exudates. Oral hygiene is good. Tongue normal, non obstructing. Hearing intact.  Neck: Supple. Thyroid not palpable. Car 2+/2+ without bruits, nodes or JVD. Chest: Respirations nl with BS clear & equal w/o rales, rhonchi, wheezing or stridor.  Cor: Heart sounds normal w/ regular rate and rhythm without sig. murmurs, gallops, clicks or rubs. Peripheral pulses normal and equal  without edema.  Abdomen: Soft & bowel sounds normal. Non-tender w/o guarding, rebound, hernias, masses  or organomegaly.  Lymphatics: Unremarkable.  Musculoskeletal: Full ROM all peripheral extremities, joint stability, 5/5 strength and normal gait.  Skin: Warm, dry without exposed rashes, lesions or ecchymosis apparent. Skin of Rt posterior shoulder was a few hive -like slightly raised skin lesions w/o vesicles. Neuro: Cranial nerves intact, reflexes equal bilaterally. Sensory-motor testing grossly intact. Tendon reflexes grossly intact.  Pysch: Alert & oriented x 3.  Insight and judgement nl & appropriate. No ideations.  Assessment and Plan:  1. Essential hypertension  - Continue medication, monitor blood pressure at home.  - Continue DASH diet.  Reminder to go to the ER if any CP,  SOB, nausea, dizziness, severe HA, changes vision/speech.  - CBC with Diff - COMPLETE METABOLIC PANEL WITH GFR - Magnesium - TSH  2. Hyperlipidemia associated with type 2 diabetes mellitus ([place])  - Continue diet/meds, exercise,& lifestyle modifications.  - Continue monitor periodic cholesterol/liver & renal functions   - Lipid Profile - TSH  3. Type 2 diabetes mellitus with stage 3a chronic kidney disease,  without long-term current use of insulin (HCC)  - Continue diet, exercise  -  Lifestyle modifications.  - Monitor appropriate labs.  - Hemoglobin A1c ([facility]) - Insulin, random  4. Vitamin D deficiency  - Continue supplementation.  - Vitamin D (25 hydroxy)  5. ASCAD s/p PTCA/Stents  - Lipid Profile  6. PAD (peripheral artery disease) (HCC)  - Lipid Profile  7. Herpes zoster w Rt posterior shoulder suspected  - predniSONE (DELTASONE) 20 MG tablet; 1 tab 3 x day for 3 days, then 1 tab 2 x day for 3 days, then 1 tab 1 x day for 5 days  Dispense: 20 tablet; Refill: 0  - gabapentin (NEURONTIN) 100 MG capsule; Take 1 capsule 3 x /day as needed for Shingles Burning or Itching Pains  Dispense: 90 capsule; Refill: 2  8. Medication management  - CBC with Diff - COMPLETE METABOLIC  PANEL WITH GFR - Magnesium - Lipid Profile - TSH - Hemoglobin A1c ([facility]) - Insulin, random - Vitamin D (25 hydroxy)       Discussed  regular exercise, BP monitoring, weight control to achieve/maintain BMI less than 25 and discussed med and SE's. Recommended labs to assess and monitor clinical status with further disposition pending results of labs.  I discussed the assessment and treatment plan with the patient. The patient was provided an opportunity to ask questions and all were answered. The patient agreed with the plan and demonstrated an understanding of the instructions.  I provided over 30 minutes of exam, counseling, chart review and  complex critical decision making.   Kirtland Bouchard, MD

## 2019-05-21 LAB — MAGNESIUM: Magnesium: 1.9 mg/dL (ref 1.5–2.5)

## 2019-05-21 LAB — COMPLETE METABOLIC PANEL WITH GFR
AG Ratio: 1.5 (calc) (ref 1.0–2.5)
ALT: 14 U/L (ref 9–46)
AST: 15 U/L (ref 10–35)
Albumin: 4.1 g/dL (ref 3.6–5.1)
Alkaline phosphatase (APISO): 60 U/L (ref 35–144)
BUN/Creatinine Ratio: 16 (calc) (ref 6–22)
BUN: 21 mg/dL (ref 7–25)
CO2: 26 mmol/L (ref 20–32)
Calcium: 10.2 mg/dL (ref 8.6–10.3)
Chloride: 97 mmol/L — ABNORMAL LOW (ref 98–110)
Creat: 1.34 mg/dL — ABNORMAL HIGH (ref 0.70–1.11)
GFR, Est African American: 56 mL/min/{1.73_m2} — ABNORMAL LOW (ref >=60)
GFR, Est Non African American: 49 mL/min/{1.73_m2} — ABNORMAL LOW (ref >=60)
Globulin: 2.7 g/dL (calc) (ref 1.9–3.7)
Glucose, Bld: 255 mg/dL — ABNORMAL HIGH (ref 65–99)
Potassium: 4.4 mmol/L (ref 3.5–5.3)
Sodium: 131 mmol/L — ABNORMAL LOW (ref 135–146)
Total Bilirubin: 0.6 mg/dL (ref 0.2–1.2)
Total Protein: 6.8 g/dL (ref 6.1–8.1)

## 2019-05-21 LAB — LIPID PANEL
Cholesterol: 132 mg/dL (ref <200)
HDL: 45 mg/dL (ref >=40)
LDL Cholesterol (Calc): 65 mg/dL (calc)
Non-HDL Cholesterol (Calc): 87 mg/dL (calc) (ref <130)
Total CHOL/HDL Ratio: 2.9 (calc) (ref <5.0)
Triglycerides: 135 mg/dL (ref <150)

## 2019-05-21 LAB — CBC WITH DIFFERENTIAL/PLATELET
Absolute Monocytes: 598 cells/uL (ref 200–950)
Basophils Absolute: 68 cells/uL (ref 0–200)
Basophils Relative: 1 %
Eosinophils Absolute: 122 cells/uL (ref 15–500)
Eosinophils Relative: 1.8 %
HCT: 52.1 % — ABNORMAL HIGH (ref 38.5–50.0)
Hemoglobin: 17.2 g/dL — ABNORMAL HIGH (ref 13.2–17.1)
Lymphs Abs: 2230 cells/uL (ref 850–3900)
MCH: 29.1 pg (ref 27.0–33.0)
MCHC: 33 g/dL (ref 32.0–36.0)
MCV: 88.2 fL (ref 80.0–100.0)
MPV: 10.6 fL (ref 7.5–12.5)
Monocytes Relative: 8.8 %
Neutro Abs: 3781 cells/uL (ref 1500–7800)
Neutrophils Relative %: 55.6 %
Platelets: 242 10*3/uL (ref 140–400)
RBC: 5.91 10*6/uL — ABNORMAL HIGH (ref 4.20–5.80)
RDW: 13.1 % (ref 11.0–15.0)
Total Lymphocyte: 32.8 %
WBC: 6.8 10*3/uL (ref 3.8–10.8)

## 2019-05-21 LAB — HEMOGLOBIN A1C
Hgb A1c MFr Bld: 7.4 % of total Hgb — ABNORMAL HIGH (ref <5.7)
Mean Plasma Glucose: 166 (calc)
eAG (mmol/L): 9.2 (calc)

## 2019-05-21 LAB — VITAMIN D 25 HYDROXY (VIT D DEFICIENCY, FRACTURES): Vit D, 25-Hydroxy: 57 ng/mL (ref 30–100)

## 2019-05-21 LAB — TSH: TSH: 1.78 mIU/L (ref 0.40–4.50)

## 2019-05-21 LAB — INSULIN, RANDOM: Insulin: 44 u[IU]/mL — ABNORMAL HIGH

## 2019-05-23 ENCOUNTER — Encounter: Payer: Self-pay | Admitting: Internal Medicine

## 2019-06-23 ENCOUNTER — Encounter: Payer: Self-pay | Admitting: *Deleted

## 2019-06-23 DIAGNOSIS — H0102A Squamous blepharitis right eye, upper and lower eyelids: Secondary | ICD-10-CM | POA: Diagnosis not present

## 2019-06-23 DIAGNOSIS — E119 Type 2 diabetes mellitus without complications: Secondary | ICD-10-CM | POA: Diagnosis not present

## 2019-06-23 DIAGNOSIS — H2511 Age-related nuclear cataract, right eye: Secondary | ICD-10-CM | POA: Diagnosis not present

## 2019-06-23 DIAGNOSIS — H25812 Combined forms of age-related cataract, left eye: Secondary | ICD-10-CM | POA: Diagnosis not present

## 2019-06-23 LAB — HM DIABETES EYE EXAM

## 2019-06-28 DIAGNOSIS — H25812 Combined forms of age-related cataract, left eye: Secondary | ICD-10-CM | POA: Diagnosis not present

## 2019-06-28 DIAGNOSIS — H2511 Age-related nuclear cataract, right eye: Secondary | ICD-10-CM | POA: Diagnosis not present

## 2019-06-30 ENCOUNTER — Encounter: Payer: Self-pay | Admitting: Internal Medicine

## 2019-07-08 ENCOUNTER — Other Ambulatory Visit: Payer: Self-pay | Admitting: Adult Health

## 2019-07-09 HISTORY — PX: CATARACT EXTRACTION, BILATERAL: SHX1313

## 2019-07-13 ENCOUNTER — Encounter: Payer: Self-pay | Admitting: Adult Health

## 2019-07-14 DIAGNOSIS — H2512 Age-related nuclear cataract, left eye: Secondary | ICD-10-CM | POA: Diagnosis not present

## 2019-07-19 DIAGNOSIS — H2512 Age-related nuclear cataract, left eye: Secondary | ICD-10-CM | POA: Diagnosis not present

## 2019-07-19 DIAGNOSIS — H25012 Cortical age-related cataract, left eye: Secondary | ICD-10-CM | POA: Diagnosis not present

## 2019-07-19 DIAGNOSIS — H25812 Combined forms of age-related cataract, left eye: Secondary | ICD-10-CM | POA: Diagnosis not present

## 2019-08-26 ENCOUNTER — Encounter: Payer: Self-pay | Admitting: Adult Health

## 2019-08-31 DIAGNOSIS — Z87891 Personal history of nicotine dependence: Secondary | ICD-10-CM | POA: Insufficient documentation

## 2019-08-31 NOTE — Progress Notes (Signed)
CPE AND FOLLOW UP  Assessment:   Essential hypertension - continue medications, DASH diet, exercise and monitor at home. Call if greater than 130/80.  -     CBC with Differential/Platelet -     CMP/GFR -     TSH -     EKG -     UA -     Microalbumin   Chronic bronchitis, unspecified chronic bronchitis type ([place]) Has stopped smoking, avoid triggers, no symptoms at this time.  Get CXR for hx of 42 pack year smoker, aged out for CT  Type 2 diabetes mellitus with stage 3 chronic kidney disease, without long-term current use of insulin ([place]) Discussed general issues about diabetes pathophysiology and management., Educational material distributed., Suggested low cholesterol diet., Encouraged aerobic exercise., Discussed foot care., Reminded to get yearly retinal exam. -     Hemoglobin A1c  Diabetic neuropathy (HCC) No pain, monitor feet daily, regular foot exams Diabetic foot exam today   Aneurysm of abdominal vessel ([place]) S/p repair in 2007 and has been released by vascular Control blood pressure, cholesterol, glucose, increase exercise.   ASCAD s/p PTCA/Stents Control blood pressure, cholesterol, glucose, increase exercise.  -     TSH -     Lipid panel  Mixed hyperlipidemia associated with T2DM (HCC) - LDL goal <70 -continue medications, check lipids, decrease fatty foods, increase activity.  -     Lipid panel  Vitamin D deficiency Continue supplement  Medication management -     Magnesium  Atherosclerosis of aorta ([place]) Per CT 07/2018 Control blood pressure, cholesterol, glucose, increase exercise.   PAD (peripheral artery disease) (HCC) Control blood pressure, cholesterol, glucose, increase exercise.   Emphysema ([place]) Per CT No triggers, well controlled symptoms, cont to monitor  Vertigo -normal neuro, does have history of vertigo in the past, meclizine PRN, if worsening HA, changes vision/speech, imbalance, weakness go to the ER  Actinic keratoses Numerous,  patient states will schedule follow up with derm  Orders Placed This Encounter  Procedures  . CBC with Differential/Platelet  . COMPLETE METABOLIC PANEL WITH GFR  . Magnesium  . Lipid panel  . TSH  . Hemoglobin A1c  . VITAMIN D 25 Hydroxy (Vit-D Deficiency, Fractures)  . PSA  . Microalbumin / creatinine urine ratio  . Urinalysis, Routine w reflex microscopic  . EKG 12-Lead  . HM DIABETES FOOT EXAM    Future Appointments  Date Time Provider [place]  11/25/2019 10:00 AM Liane Comber, NP [facility] None  08/25/2020 10:30 AM Liane Comber, NP [facility] None    Subjective:  Isaiah Jackson is a 86 y.o. male who presents for CPE and 3 month follow up for HTN, hyperlipidemia, diabetes, and vitamin D Def. Has COPD via CXR but denies SOB.    He is single, has 1 daughter, 4 grandkids. He is an retired Clinical biochemist.   He has history of vertigo, has been on prednisone/meclizine in the past, unsure if this really helped. Recently improved. Denies falls.   Just had bilateral cataracts done early this year by Dr. Katy Fitch. He is happy with the results.   BMI is Body mass index is 26.89 kg/m., he has been working on diet, cutting down on startches and sugars, He goes dancing once a week for a few hours but has been too busy with family and surgery. He walks during the summer more.  Drinks 3 bottles of water daily, up from 2 bottles last year.   Wt Readings from Last 3 Encounters:  09/01/19 201 lb (91.2 kg)  05/20/19 204 lb 3.2 oz (92.6 kg)  02/15/19 201 lb 12.8 oz (91.5 kg)   Former smoker, estimated 42 pack year history, quit in 1991.   Has history of MI in 1994, PTCA in 1996, negative stress test 2006, has history of AAA s/p released after repair in 2007. Has history of PAD. He reports has been released by cardiology, was following with Dr. Wynonia Lawman. CT chest in 07/2018 demonstrated aortic atherosclerosis and emphysema.  His blood pressure has been controlled at home, today  their BP is BP: 120/62 He does workout, walks and dances on the weekends. He denies chest pain, shortness of breath, dizziness.   He is on cholesterol medication (simvastatin 40 mg daily) and denies myalgias. His cholesterol is at goal. The cholesterol last visit was:   Lab Results  Component Value Date   CHOL 132 05/20/2019   HDL 45 05/20/2019   LDLCALC 65 05/20/2019   TRIG 135 05/20/2019   CHOLHDL 2.9 05/20/2019   He has DMII since 2003 w/Stage III CKD.checks his sugars every morning at home runs 112-145, but this AM was unusually elevated at 214. He is on ACE, he is on ASA. Managed by metformin and glimepiride 4 mg with breakfast.   He has been working on diet and exercise for diabetes, and denies paresthesia of the feet, polydipsia, polyuria and visual disturbances. Last A1C in the office was:  Lab Results  Component Value Date   HGBA1C 7.4 (H) 05/20/2019   Patient is on Vitamin D supplement, taking 5000 IU daily  Lab Results  Component Value Date   VD25OH 57 05/20/2019    He has CKD III associated with T2DM:  Lab Results  Component Value Date   GFRNONAA 49 (L) 05/20/2019   GFRNONAA 55 (L) 02/15/2019   GFRNONAA 54 (L) 10/26/2018    Lab Results  Component Value Date   PSA 0.2 07/09/2018   PSA 0.1 01/30/2017   PSA 0.14 12/27/2015  Denies weak stream, straining, dribbling; reports gets up twice a night to urinate.   Medication Review: Current Outpatient Medications on File Prior to Visit  Medication Sig Dispense Refill  . aspirin 81 MG tablet Take 81 mg by mouth daily.    . Cholecalciferol (VITAMIN D-3) 1000 UNITS CAPS Take 5,000 Units by mouth daily.    . fenofibrate micronized (LOFIBRA) 134 MG capsule Take 1 capsule Daily for Triglycerides (Blood Fats) 90 capsule 3  . glucose blood (FREESTYLE LITE) test strip USE TO CHECK BLOOD SUGARS  ONE TIME DAILY 100 each 0  . losartan (COZAAR) 50 MG tablet TAKE 1 TABLET BY MOUTH EVERY DAY FOR BLOOD PRESSURE 90 tablet 1  .  Magnesium 400 MG CAPS Take 400 mg by mouth daily.    . metFORMIN (GLUCOPHAGE-XR) 500 MG 24 hr tablet Take 1 tablet with Breakfast & Lunch & 2 tablets with Supper for Diabetes 360 tablet 3  . simvastatin (ZOCOR) 40 MG tablet TAKE 1 TABLET(40 MG) BY MOUTH DAILY 90 tablet 1  . gabapentin (NEURONTIN) 100 MG capsule Take 1 capsule 3 x /day as needed for Shingles Burning or Itching Pains 90 capsule 2  . predniSONE (DELTASONE) 20 MG tablet 1 tab 3 x day for 3 days, then 1 tab 2 x day for 3 days, then 1 tab 1 x day for 5 days 20 tablet 0   No current facility-administered medications on file prior to visit.    Current Problems (verified) Patient Active Problem List  Diagnosis Date Noted  . Actinic keratoses 09/01/2019  . Former smoker 08/31/2019  . CKD stage 3 due to type 2 diabetes mellitus ([place]) 10/13/2017  . Vertigo 07/02/2017  . Atherosclerosis of aorta ([place]) 10/11/2016  . PAD (peripheral artery disease) ([place]) 10/11/2016  . Encounter for Medicare annual wellness exam 08/28/2015  . Overweight (BMI 25.0-29.9) 05/25/2015  . ASCAD s/p PTCA/Stents 11/07/2014  . Medication management 10/13/2013  . Type II diabetes mellitus with CKD 3 (GFR 58 ml/min)  (HCC) 10/13/2013  . Hyperlipidemia associated with type 2 diabetes mellitus ([place])   . Hypertension   . Vitamin D deficiency   . COPD (chronic obstructive pulmonary disease) ([place])   . Aneurysm of abdominal vessel ([place]) 11/07/2011    Screening Tests Immunization History  Administered Date(s) Administered  . Influenza Whole 06/16/2012, 03/26/2013  . Influenza, High Dose Seasonal PF 04/19/2014, 04/26/2015, 04/23/2016, 04/17/2017, 04/28/2018, 03/31/2019  . Td 05/26/2003   Preventative care: Last colonoscopy: 2014 per patient, was advised none further  CXR 07/2018 Carotid Doppler neg 2013 Stress test 2002  AB aorta 2014 CT chest 07/2018 - emphysema, aortic atherosclerosis  Prior vaccinations: TD or Tdap: 2004, declines further Influenza:  03/2019 Pneumococcal: 2000, declines Prevnar 13: declines Shingles/Zostavax: declines  Names of Other Physician/Practitioners you currently use: 1.  [facility]13/2019 - report verified and abstracted  3. , dentist, last visit 2018 - full dentures  4. Dr. Allyson Sabal, derm, last visit 2019, overdue, will schedule  Patient Care Team: [name], MD as PCP - General (Internal Medicine) Jacolyn Reedy, MD as Consulting Physician (Cardiology) Elam Dutch, MD as Consulting Physician (Vascular Surgery) Laurence Spates, MD (Inactive) as Consulting Physician (Gastroenterology) Druscilla Brownie, MD as Consulting Physician (Dermatology)   Allergies Allergies  Allergen Reactions  . Capoten [Captopril]     Fatigue/depression  . Lamisil [Terbinafine] Rash    SURGICAL HISTORY He  has a past surgical history that includes Abdominal aortic aneurysm repair (10/23/2005); cytoscopy; Shoulder surgery (Right); and Cataract extraction, bilateral (Bilateral, 2021). FAMILY HISTORY His family history includes Diabetes in his daughter, mother, and sister; Heart attack (age of onset: 29) in his mother; Heart disease in his father and mother. SOCIAL HISTORY He  reports that he quit smoking about 30 years ago. His smoking use included cigarettes. He has a 42.00 pack-year smoking history. He has never used smokeless tobacco. He reports that he does not drink alcohol or use drugs.   Objective:   Blood pressure 120/62, pulse 67, temperature 97.7 F (36.5 C), height 6' 0.5" (1.842 m), weight 201 lb (91.2 kg), SpO2 97 %. Body mass index is 26.89 kg/m.  General appearance: alert, no distress, WD/WN, male HEENT: normocephalic, sclerae bilaterally mildly erythematous, mild erythema to conjunctiva with scan purulent discharge to inner canthus, TMs pearly, nares patent, no discharge or erythema, pharynx normal,  very HOH (not wearing his hearing aids) Oral cavity: Dark pink moist; full upper and lower dentures, no ulcers.  Neck: supple, no masses or adenopathy Heart: RRR, normal S1, S2, no murmurs Lungs: CTA bilaterally, no wheezes, rhonchi, or rales Abdomen: +bs, soft, non tender, non distended, no masses, no hepatomegaly, no splenomegaly Musculoskeletal: no swelling, no obvious deformity Extremities: no edema, no cyanosis, no clubbing Pulses: 2+ symmetric, upper and lower extremities, normal cap refill Neurological: alert, oriented x 3, CN2-12 intact, strength normal upper extremities and lower extremities, sensation decreased to monofilament in bil feet, DTRs 2+  throughout, no cerebellar signs, gait normal Skin: several seb keratosis, dry/thin skin  Psychiatric: normal affect, behavior normal, pleasant    EKG: sinus rhythm with PACs, no ST changes  Isaiah Ribas, NP   09/01/2019

## 2019-09-01 ENCOUNTER — Other Ambulatory Visit: Payer: Self-pay

## 2019-09-01 ENCOUNTER — Ambulatory Visit (INDEPENDENT_AMBULATORY_CARE_PROVIDER_SITE_OTHER): Payer: Medicare Other | Admitting: Adult Health

## 2019-09-01 ENCOUNTER — Encounter: Payer: Self-pay | Admitting: Adult Health

## 2019-09-01 VITALS — BP 120/62 | HR 67 | Temp 97.7°F | Ht 72.5 in | Wt 201.0 lb

## 2019-09-01 DIAGNOSIS — E785 Hyperlipidemia, unspecified: Secondary | ICD-10-CM | POA: Diagnosis not present

## 2019-09-01 DIAGNOSIS — E1141 Type 2 diabetes mellitus with diabetic mononeuropathy: Secondary | ICD-10-CM

## 2019-09-01 DIAGNOSIS — E1122 Type 2 diabetes mellitus with diabetic chronic kidney disease: Secondary | ICD-10-CM | POA: Diagnosis not present

## 2019-09-01 DIAGNOSIS — I10 Essential (primary) hypertension: Secondary | ICD-10-CM | POA: Diagnosis not present

## 2019-09-01 DIAGNOSIS — Z125 Encounter for screening for malignant neoplasm of prostate: Secondary | ICD-10-CM

## 2019-09-01 DIAGNOSIS — N183 Chronic kidney disease, stage 3 unspecified: Secondary | ICD-10-CM

## 2019-09-01 DIAGNOSIS — Z136 Encounter for screening for cardiovascular disorders: Secondary | ICD-10-CM | POA: Diagnosis not present

## 2019-09-01 DIAGNOSIS — I700 Atherosclerosis of aorta: Secondary | ICD-10-CM

## 2019-09-01 DIAGNOSIS — L570 Actinic keratosis: Secondary | ICD-10-CM | POA: Insufficient documentation

## 2019-09-01 DIAGNOSIS — Z87891 Personal history of nicotine dependence: Secondary | ICD-10-CM

## 2019-09-01 DIAGNOSIS — I739 Peripheral vascular disease, unspecified: Secondary | ICD-10-CM

## 2019-09-01 DIAGNOSIS — R42 Dizziness and giddiness: Secondary | ICD-10-CM

## 2019-09-01 DIAGNOSIS — E663 Overweight: Secondary | ICD-10-CM

## 2019-09-01 DIAGNOSIS — I714 Abdominal aortic aneurysm, without rupture, unspecified: Secondary | ICD-10-CM

## 2019-09-01 DIAGNOSIS — I2510 Atherosclerotic heart disease of native coronary artery without angina pectoris: Secondary | ICD-10-CM | POA: Diagnosis not present

## 2019-09-01 DIAGNOSIS — J42 Unspecified chronic bronchitis: Secondary | ICD-10-CM

## 2019-09-01 DIAGNOSIS — Z0000 Encounter for general adult medical examination without abnormal findings: Secondary | ICD-10-CM

## 2019-09-01 DIAGNOSIS — Z0001 Encounter for general adult medical examination with abnormal findings: Secondary | ICD-10-CM

## 2019-09-01 DIAGNOSIS — E559 Vitamin D deficiency, unspecified: Secondary | ICD-10-CM

## 2019-09-01 DIAGNOSIS — E1169 Type 2 diabetes mellitus with other specified complication: Secondary | ICD-10-CM

## 2019-09-01 DIAGNOSIS — Z79899 Other long term (current) drug therapy: Secondary | ICD-10-CM

## 2019-09-01 MED ORDER — GLIMEPIRIDE 4 MG PO TABS: ORAL_TABLET | ORAL | 2 refills | Status: DC

## 2019-09-01 NOTE — Patient Instructions (Addendum)
Isaiah Jackson , Thank you for taking time to come for your Medicare Wellness Visit. I appreciate your ongoing commitment to your health goals. Please review the following plan we discussed and let me know if I can assist you in the future.   These are the goals we discussed: Goals    . DIET - INCREASE WATER INTAKE     Aim for 5-6 bottles of water daily     . Exercise - walk 20 min daily    . fasting glucose <150     Check fasting (morning) sugar daily and write down on a log  Check blood sugar later in the day only if feeling bad    . Weight (lb) < 195 lb (88.5 kg)       This is a list of the screening recommended for you and due dates:  Health Maintenance  Topic Date Due  . Complete foot exam   07/10/2019  . Hemoglobin A1C  11/17/2019  . Eye exam for diabetics  06/22/2020  . Flu Shot  Completed  . Tetanus Vaccine  Discontinued  . Pneumonia vaccines  Discontinued     Please schedule a follow up with dermatology   Please wear your hearing aids when coming in for doctors appointments      Diabetes Mellitus and [place] care is an important part of your health, especially when you have diabetes. Diabetes may cause you to have problems because of poor blood flow (circulation) to your feet and legs, which can cause your skin to:  Become thinner and drier.  Break more easily.  Heal more slowly.  Peel and crack. You may also have nerve damage (neuropathy) in your legs and feet, causing decreased feeling in them. This means that you may not notice minor injuries to your feet that could lead to more serious problems. Noticing and addressing any potential problems early is the best way to prevent future foot problems. How to care for your feet Foot hygiene  Wash your feet daily with warm water and mild soap. Do not use hot water. Then, pat your feet and the areas between your toes until they are completely dry. Do not soak your feet as this can dry your skin.  Trim  your toenails straight across. Do not dig under them or around the cuticle. File the edges of your nails with an emery board or nail file.  Apply a moisturizing lotion or petroleum jelly to the skin on your feet and to dry, brittle toenails. Use lotion that does not contain alcohol and is unscented. Do not apply lotion between your toes. Shoes and socks  Wear clean socks or stockings every day. Make sure they are not too tight. Do not wear knee-high stockings since they may decrease blood flow to your legs.  Wear shoes that fit properly and have enough cushioning. Always look in your shoes before you put them on to be sure there are no objects inside.  To break in new shoes, wear them for just a few hours a day. This prevents injuries on your feet. Wounds, scrapes, corns, and calluses  Check your feet daily for blisters, cuts, bruises, sores, and redness. If you cannot see the bottom of your feet, use a mirror or ask someone for help.  Do not cut corns or calluses or try to remove them with medicine.  If you find a minor scrape, cut, or break in the skin on your feet, keep it and the skin  around it clean and dry. You may clean these areas with mild soap and water. Do not clean the area with peroxide, alcohol, or iodine.  If you have a wound, scrape, corn, or callus on your foot, look at it several times a day to make sure it is healing and not infected. Check for: ? Redness, swelling, or pain. ? Fluid or blood. ? Warmth. ? Pus or a bad smell. General instructions  Do not cross your legs. This may decrease blood flow to your feet.  Do not use heating pads or hot water bottles on your feet. They may burn your skin. If you have lost feeling in your feet or legs, you may not know this is happening until it is too late.  Protect your feet from hot and cold by wearing shoes, such as at the beach or on hot pavement.  Schedule a complete foot exam at least once a year (annually) or more often  if you have foot problems. If you have foot problems, report any cuts, sores, or bruises to your health care provider immediately. Contact a health care provider if:  You have a medical condition that increases your risk of infection and you have any cuts, sores, or bruises on your feet.  You have an injury that is not healing.  You have redness on your legs or feet.  You feel burning or tingling in your legs or feet.  You have pain or cramps in your legs and feet.  Your legs or feet are numb.  Your feet always feel cold.  You have pain around a toenail. Get help right away if:  You have a wound, scrape, corn, or callus on your foot and: ? You have pain, swelling, or redness that gets worse. ? You have fluid or blood coming from the wound, scrape, corn, or callus. ? Your wound, scrape, corn, or callus feels warm to the touch. ? You have pus or a bad smell coming from the wound, scrape, corn, or callus. ? You have a fever. ? You have a red line going up your leg. Summary  Check your feet every day for cuts, sores, red spots, swelling, and blisters.  Moisturize feet and legs daily.  Wear shoes that fit properly and have enough cushioning.  If you have foot problems, report any cuts, sores, or bruises to your health care provider immediately.  Schedule a complete foot exam at least once a year (annually) or more often if you have foot problems. This information is not intended to replace advice given to you by your health care provider. Make sure you discuss any questions you have with your health care provider. Document Revised: 03/17/2019 Document Reviewed: 07/26/2016 Elsevier Patient Education  [place].

## 2019-09-03 LAB — CBC WITH DIFFERENTIAL/PLATELET
Absolute Monocytes: 566 cells/uL (ref 200–950)
Basophils Absolute: 52 cells/uL (ref 0–200)
Basophils Relative: 0.8 %
Eosinophils Absolute: 59 cells/uL (ref 15–500)
Eosinophils Relative: 0.9 %
HCT: 52.8 % — ABNORMAL HIGH (ref 38.5–50.0)
Hemoglobin: 16.9 g/dL (ref 13.2–17.1)
Lymphs Abs: 2087 cells/uL (ref 850–3900)
MCH: 26.5 pg — ABNORMAL LOW (ref 27.0–33.0)
MCHC: 32 g/dL (ref 32.0–36.0)
MCV: 82.8 fL (ref 80.0–100.0)
MPV: 10.9 fL (ref 7.5–12.5)
Monocytes Relative: 8.7 %
Neutro Abs: 3738 cells/uL (ref 1500–7800)
Neutrophils Relative %: 57.5 %
Platelets: 244 10*3/uL (ref 140–400)
RBC: 6.38 10*6/uL — ABNORMAL HIGH (ref 4.20–5.80)
RDW: 14.4 % (ref 11.0–15.0)
Total Lymphocyte: 32.1 %
WBC: 6.5 10*3/uL (ref 3.8–10.8)

## 2019-09-03 LAB — URINALYSIS, ROUTINE W REFLEX MICROSCOPIC
Bilirubin Urine: NEGATIVE
Hgb urine dipstick: NEGATIVE
Ketones, ur: NEGATIVE
Leukocytes,Ua: NEGATIVE
Nitrite: NEGATIVE
Protein, ur: NEGATIVE
Specific Gravity, Urine: 1.028 (ref 1.001–1.03)
pH: 6.5 (ref 5.0–8.0)

## 2019-09-03 LAB — COMPLETE METABOLIC PANEL WITH GFR
AG Ratio: 1.5 (calc) (ref 1.0–2.5)
ALT: 14 U/L (ref 9–46)
AST: 14 U/L (ref 10–35)
Albumin: 4.1 g/dL (ref 3.6–5.1)
Alkaline phosphatase (APISO): 80 U/L (ref 35–144)
BUN/Creatinine Ratio: 12 (calc) (ref 6–22)
BUN: 17 mg/dL (ref 7–25)
CO2: 28 mmol/L (ref 20–32)
Calcium: 10.2 mg/dL (ref 8.6–10.3)
Chloride: 95 mmol/L — ABNORMAL LOW (ref 98–110)
Creat: 1.46 mg/dL — ABNORMAL HIGH (ref 0.70–1.11)
GFR, Est African American: 51 mL/min/{1.73_m2} — ABNORMAL LOW (ref >=60)
GFR, Est Non African American: 44 mL/min/{1.73_m2} — ABNORMAL LOW (ref >=60)
Globulin: 2.7 g/dL (calc) (ref 1.9–3.7)
Glucose, Bld: 344 mg/dL — ABNORMAL HIGH (ref 65–99)
Potassium: 4.8 mmol/L (ref 3.5–5.3)
Sodium: 130 mmol/L — ABNORMAL LOW (ref 135–146)
Total Bilirubin: 0.7 mg/dL (ref 0.2–1.2)
Total Protein: 6.8 g/dL (ref 6.1–8.1)

## 2019-09-03 LAB — LIPID PANEL
Cholesterol: 135 mg/dL (ref <200)
HDL: 46 mg/dL (ref >=40)
LDL Cholesterol (Calc): 66 mg/dL (calc)
Non-HDL Cholesterol (Calc): 89 mg/dL (calc) (ref <130)
Total CHOL/HDL Ratio: 2.9 (calc) (ref <5.0)
Triglycerides: 145 mg/dL (ref <150)

## 2019-09-03 LAB — HEMOGLOBIN A1C
Hgb A1c MFr Bld: 9.5 % of total Hgb — ABNORMAL HIGH (ref <5.7)
Mean Plasma Glucose: 226 (calc)
eAG (mmol/L): 12.5 (calc)

## 2019-09-03 LAB — MICROALBUMIN / CREATININE URINE RATIO
Creatinine, Urine: 47 mg/dL (ref 20–320)
Microalb Creat Ratio: 106 mcg/mg creat — ABNORMAL HIGH (ref <30)
Microalb, Ur: 5 mg/dL

## 2019-09-03 LAB — VITAMIN D 25 HYDROXY (VIT D DEFICIENCY, FRACTURES): Vit D, 25-Hydroxy: 80 ng/mL (ref 30–100)

## 2019-09-03 LAB — MAGNESIUM: Magnesium: 2.1 mg/dL (ref 1.5–2.5)

## 2019-09-03 LAB — PSA: PSA: 0.1 ng/mL (ref <=4.0)

## 2019-09-03 LAB — TSH: TSH: 2.02 mIU/L (ref 0.40–4.50)

## 2019-09-21 ENCOUNTER — Other Ambulatory Visit: Payer: Self-pay | Admitting: Adult Health

## 2019-09-21 ENCOUNTER — Other Ambulatory Visit: Payer: Self-pay

## 2019-09-21 DIAGNOSIS — N1831 Chronic kidney disease, stage 3a: Secondary | ICD-10-CM

## 2019-09-21 DIAGNOSIS — E1122 Type 2 diabetes mellitus with diabetic chronic kidney disease: Secondary | ICD-10-CM

## 2019-09-30 ENCOUNTER — Other Ambulatory Visit: Payer: Self-pay

## 2019-09-30 ENCOUNTER — Ambulatory Visit (INDEPENDENT_AMBULATORY_CARE_PROVIDER_SITE_OTHER): Payer: Medicare Other | Admitting: Adult Health Nurse Practitioner

## 2019-09-30 ENCOUNTER — Encounter: Payer: Self-pay | Admitting: Adult Health Nurse Practitioner

## 2019-09-30 VITALS — BP 140/82 | HR 61 | Temp 97.5°F | Ht 72.5 in | Wt 203.0 lb

## 2019-09-30 DIAGNOSIS — N183 Chronic kidney disease, stage 3 unspecified: Secondary | ICD-10-CM

## 2019-09-30 DIAGNOSIS — E1122 Type 2 diabetes mellitus with diabetic chronic kidney disease: Secondary | ICD-10-CM

## 2019-09-30 MED ORDER — BLOOD GLUCOSE MONITORING SUPPL DEVI: 0 refills | Status: AC

## 2019-09-30 MED ORDER — GLUCOSE BLOOD VI STRP: ORAL_STRIP | 4 refills | Status: AC

## 2019-09-30 MED ORDER — GLUCOSE BLOOD VI STRP: ORAL_STRIP | 4 refills | Status: DC

## 2019-09-30 MED ORDER — BLOOD GLUCOSE MONITORING SUPPL DEVI: 0 refills | Status: DC

## 2019-09-30 NOTE — Progress Notes (Signed)
Diabetes teaching  Diabetes Education and Follow-Up Visit  86 y.o.male presents for diabetic education. He has Diabetes Mellitus type 2:  with diabetic chronic kidney disease, he is on bASA, and denies increased appetite, nausea, paresthesia of the feet, polydipsia, polyuria, visual disturbances, vomiting and weight loss.  His fasting blood glyucose is running from 140-150's   With high in 220's and lowest blood glucose fasting 109.  He does not check his blood glucose any other time of day.  He does not check if he feels bad. He reports he can tell when his blood glucose is low, typically 90 or less and he eats something.  Reports he he goes for a piece of candy.   Denies any hypoglycemia symptoms.  Urination 3-4 times a day and gets up twice a night.  No change in this pattern.  Reports he eats oatmeal for breakfast.  He eats varying things for lunch including Janine Limbo and for dinner he typically eats a lean meat and vegetables.  He reports he drinks 3 bottles of water a day.    He is taking Glimepride 4mg , whole tablet four days a week, Tues, Thurs, Sat & Sunday.  He is taking Metformin 500mg  XR, one tablet with breakfast, two tablets with dinner and one at bedtime.  He reports he typically forgets the lunch time medication so he changed it to bedtime.    Last hemoglobin A1c was: Lab Results  Component Value Date   HGBA1C 9.5 (H) 09/01/2019   HGBA1C 7.4 (H) 05/20/2019   HGBA1C 8.4 (H) 02/15/2019    Body mass index is 27.15 kg/m.  Pt is on a regimen of: Glimepride 4mg  Tues, Thurs, Sat, Sun.  He take metformin 500mg  XR one tablets at breakfast, two tablets at dinner and one tablet at bedtime.  Pt checks his sugars 1 x day  Lowest sugar was 109.  He has hypoglycemia awareness.  Highest sugar was 245.  Glucometer: Freestyle Freedom Lite  *He received letter from insurance that they will no longer cover his test strips.  He has used this monitor for many years.  Exercise: He has  been doing lots of work onhis property cuttinmg up trees and   Patient does have CKD He is on ACE/ARB  Lab Results  Component Value Date   GFRAA 51 (L) 09/01/2019      Lab Results  Component Value Date   GFRNONAA 44 (L) 09/01/2019    Lab Results  Component Value Date   CREATININE 1.46 (H) 09/01/2019   BUN 17 09/01/2019   NA 130 (L) 09/01/2019   K 4.8 09/01/2019   CL 95 (L) 09/01/2019   CO2 28 09/01/2019    Lab Results  Component Value Date   MICROALBUR 5.0 09/01/2019     He is on a Statin.  He is at goal of less than 70.  Lab Results  Component Value Date   CHOL 135 09/01/2019   HDL 46 09/01/2019   LDLCALC 66 09/01/2019   TRIG 145 09/01/2019   CHOLHDL 2.9 09/01/2019     Problem List has Aneurysm of abdominal vessel ([place]); Hyperlipidemia associated with type 2 diabetes mellitus ([place]); Hypertension; Vitamin D deficiency; COPD (chronic obstructive pulmonary disease) ([place]); Medication management; Type II diabetes mellitus with CKD 3 (GFR 58 ml/min)  (HCC); ASCAD s/p PTCA/Stents; Overweight (BMI 25.0-29.9); Encounter for Medicare annual wellness exam; Atherosclerosis of aorta ([place]); PAD (peripheral artery disease) ([place]); Vertigo; CKD stage 3 due to type 2 diabetes mellitus ([place]); Former  smoker; Actinic keratoses; and Diabetic mononeuropathy associated with type 2 diabetes mellitus ([place]) on their problem list.  Medications Current Outpatient Medications on File Prior to Visit  Medication Sig  . aspirin 81 MG tablet Take 81 mg by mouth daily.  . Cholecalciferol (VITAMIN D-3) 1000 UNITS CAPS Take 5,000 Units by mouth daily.  . fenofibrate micronized (LOFIBRA) 134 MG capsule Take 1 capsule Daily for Triglycerides (Blood Fats)  . glimepiride (AMARYL) 4 MG tablet Start by taking 1/2 tab daily with breakfast for blood sugar. If fasting blood sugars still 150+ in 1 week, increase to whole tab daily.  [name] glucose blood (FREESTYLE LITE) test strip USE 1 STRIP TO CHECK GLUCOSE ONCE  DAILY. Dx: E11.29  . losartan (COZAAR) 50 MG tablet TAKE 1 TABLET BY MOUTH EVERY DAY FOR BLOOD PRESSURE  . Magnesium 400 MG CAPS Take 400 mg by mouth daily.  . metFORMIN (GLUCOPHAGE-XR) 500 MG 24 hr tablet Take 1 tablet with Breakfast & Lunch & 2 tablets with Supper for Diabetes  . simvastatin (ZOCOR) 40 MG tablet TAKE 1 TABLET(40 MG) BY MOUTH DAILY   No current facility-administered medications on file prior to visit.    ROS- see HPI  Physical Exam: Blood pressure 140/82, pulse 61, temperature (!) 97.5 F (36.4 C), height 6' 0.5" (1.842 m), weight 203 lb (92.1 kg), SpO2 98 %. Body mass index is 27.15 kg/m. General Appearance: Well nourished, in no apparent distress. Eyes: PERRLA, EOMs, conjunctiva no swelling or erythema ENT/Mouth: Ext aud canals clear, TMs without erythema, bulging. No erythema, swelling, or exudate on post pharynx.  Tonsils not swollen or erythematous. Hearing normal.  Respiratory: Respiratory effort normal, BS equal bilaterally without rales, rhonchi, wheezing or stridor.  Cardio: RRR with no MRGs. Brisk peripheral pulses without edema.  Abdomen: Soft, + BS.  Non tender, no guarding, rebound, hernias, masses. Musculoskeletal: Full ROM, 5/5 strength, normal gait.  Skin: Warm, dry without rashes, lesions, ecchymosis.  Neuro: Cranial nerves intact. Normal muscle tone, no cerebellar symptoms. Sensation intact.    Plan and Assessment: Diabetes Education: Reviewed 'ABCs' of diabetes management (respective goals in parentheses):  A1C (<7), blood pressure (<130/80), and cholesterol (LDL <70) Eye Exam yearly and Dental Exam every 6 months. Dietary recommendations Physical Activity recommendations - Strongly advised him to start checking sugars at different times of the day - check 2 times a day, rotating checks - given sugar log and advised how to fill it and to bring it at next appt  - given foot care handout and explained the principles  - given instructions for  hypoglycemia management   Change the way you take the glimepride 4mg , take one tablet daily with breakfast.  Check blood glucose twice a day, fasting and second time in evening before dinner, post prandial or at bedtime.  Record this Also record food you are eating. Sheet for documentation provided Printed educational material on diet provided. Follow up in two weeks with logs for review.  Future Appointments  Date Time Provider [place]  09/30/2019  9:00 AM Garnet Sierras, NP [facility] None  12/08/2019  9:00 AM Liane Comber, NP [facility] None  03/22/2020  9:30 AM [name], MD [facility] None  08/25/2020 10:30 AM Liane Comber, NP [facility] None      Garnet Sierras, NP   09/30/2019

## 2019-09-30 NOTE — Patient Instructions (Signed)
Start checking blood glucose readings in evening before dinner and record this number.    We are going to increase the Glimepiride 4mg  tablet you take.  Start taking this medication everyday in the morning with breakfast.    Continue checking your blood glucose.  We will follow up in two week with your blood glucose log for review.  Please let us know if you have any difficulties getting your diabetes supplies to check your blood glucose.  You can ask the "cash price" for the test strips for the machine you have. -We placed an order for new tests strips for your machine AND/OR a new machine covered by insurance with test strips they will cover.   When you feel like your blood glucose is low, please check the reading.   Have a snack that has protein to keep your blood sugar from spiking and dropping with candy.  Of course please use this if you are away from the home.   Examples of healthy snacks are peanut butter and cracker or nuts, yogurt.  Fruit is healthy in moderation as it has natural sugars.     Focus on eating lean protein, high-fiber, less processed carbs, fruits(in moderation), and vegetables, low-fat dairy, and healthy vegetable-based fats such as avocado, nuts, canola oil, or olive oil.   Blood sugar control and a healthy weight should be your goal.  A good starting point for a woman is 1,400-1,600 calories a day, with main meals containing up to 30 grams of fiber-rich carbohydrates, and snacks containing 10-20 grams of fiber-rich carbohydrates.  Men are typically more physically active and should start with a 2,000-2,200 calorie meal plan, in which you may increase carbohydrates proportional to your activities.  Recent research suggests that by eating a big breakfast, and a modest lunch, so you get most of your calories in by 3 pm, you will find it easier to lose weight and achieve better blood sugar control.  A high fiber diet--one that contains at least 25 to 35  grams of dietary fiber a day--is essential for good health, and is the key for people with diabetes because fiber helps slow down the absorption of all sugars--those that are naturally forming like in fruits and starches, as well as any refined sugars you consume--in your bloodstream.         Create well-balanced meals (including protein, fat and fiber-rich carbs), they are generally more satisfying.  Doing this decreses chances of being hungry between meals so less likely to look for a quick fix that will cause your blood sugar to soar, and your body to store those unneeded calories as fat.  Good sources of fiber include:      Pulses (like lentils and peas) and beans and legumes (think black beans, kidney beans, pintos, chickpeas and white beans)      Fruits and vegetables, especially those with edible skin (like apples and beans) and those with edible seeds (like berries)     Nuts--try different kinds (peanuts, walnuts and almonds are a good source of fiber and healthy fat, but watch portion sizes, because they also contain a lot of calories in a small amount!)     Whole grains such as:         Quinoa, barley, brown rice and farro         Whole wheat pasta         Whole grain cereals, including those made from whole wheat, wheat bran and oats  What foods contain heart-healthy fats? These include olive oil and oils made from nuts (eg, walnut oil, peanut oil), avocado, fatty fish l(eg, [place] salmon, mackerel, herring, and [place] trout), nuts and seeds.  The key to a balanced diet is planning meals using the diabetes plate method--divide the plate into quarters:  protein or meat, 1/4 carbs, and 2/4 (=1/2) vegetable and fruit.6 If you want to lose weight, use 9-inch dinner plates and bowls so you aren't piling the food on to a large dinner plate.  For example, fill half the plate with non-starchy carbs such as salad greens or steamed broccoli, and fill the remaining half of the plate with equal  portions of a grain or starchy vegetable like mashed sweet potato, and a heart-healthy protein such as broiled salmon.   Diabetes Plate Method  Check out the site below for more detailed information and examples.  [employer]

## 2019-10-13 ENCOUNTER — Other Ambulatory Visit: Payer: Self-pay | Admitting: Internal Medicine

## 2019-10-13 MED ORDER — LOSARTAN POTASSIUM 50 MG PO TABS: ORAL_TABLET | ORAL | 0 refills | Status: DC

## 2019-10-14 ENCOUNTER — Other Ambulatory Visit: Payer: Self-pay

## 2019-10-14 ENCOUNTER — Ambulatory Visit (INDEPENDENT_AMBULATORY_CARE_PROVIDER_SITE_OTHER): Payer: Medicare Other | Admitting: Adult Health Nurse Practitioner

## 2019-10-14 ENCOUNTER — Encounter: Payer: Self-pay | Admitting: Adult Health Nurse Practitioner

## 2019-10-14 VITALS — BP 140/74 | HR 58 | Temp 97.5°F | Ht 72.5 in | Wt 199.8 lb

## 2019-10-14 DIAGNOSIS — E1121 Type 2 diabetes mellitus with diabetic nephropathy: Secondary | ICD-10-CM | POA: Diagnosis not present

## 2019-10-14 DIAGNOSIS — Z7189 Other specified counseling: Secondary | ICD-10-CM | POA: Diagnosis not present

## 2019-10-14 DIAGNOSIS — E1122 Type 2 diabetes mellitus with diabetic chronic kidney disease: Secondary | ICD-10-CM

## 2019-10-14 DIAGNOSIS — E1165 Type 2 diabetes mellitus with hyperglycemia: Secondary | ICD-10-CM | POA: Diagnosis not present

## 2019-10-14 DIAGNOSIS — N1831 Chronic kidney disease, stage 3a: Secondary | ICD-10-CM | POA: Diagnosis not present

## 2019-10-14 MED ORDER — GLIMEPIRIDE 4 MG PO TABS: ORAL_TABLET | ORAL | 2 refills | Status: DC

## 2019-10-14 MED ORDER — METFORMIN HCL ER 500 MG PO TB24: ORAL_TABLET | ORAL | 3 refills | Status: DC

## 2019-10-14 NOTE — Patient Instructions (Addendum)
   Change the way you take your medications for diabetes.  Continue to take the Glimepride (Amaryl) 4mg  one tablet every morning.  Metformin (Glucophage) 500mg  Take two tablets with breakfast and two tablets with dinner.  This should help to decrease the blood glucose in the evening.   When you feel bad, check you blood sugar.  If you are having low readings consistantly, please let us know.  When you have a snack be sure that it has protein, like peanut butter to help keep your blood sugar more level.  Having a snack high in sugar raises blood glucose but then it drops down low quickly.

## 2019-10-14 NOTE — Progress Notes (Signed)
Diabetes teaching follow up  Diabetes Education and Follow-Up Visit  86 y.o.male presents for diabetic education. He has Diabetes Mellitus type 2:  with diabetic chronic kidney disease, he is on bASA, and denies increased appetite, nausea, paresthesia of the feet, polydipsia, polyuria, visual disturbances, vomiting and weight loss.  His fasting blood glyucose is running from [phone]'s   With high in 270's and lowest blood glucose fasting 176.  He does not check his blood glucose any other time of day.  He does not check if he feels bad.  He reports he can tell when his blood glucose is low, typically 90 or less and he eats something.  Reports he he goes for a piece of candy.  He does not recheck after feeling better.   Denies any hypoglycemia symptoms.  Urination 3-4 times a day and gets up twice a night.  No change in this pattern.  Reports he eats oatmeal for breakfast.  He eats varying things for lunch including Janine Limbo and for dinner he typically eats a lean meat and vegetables.  He reports he drinks 3 bottles of water a day, knows he should increase this.  He is taking Glimepride 4mg , whole tablet four days a week, Tues, Thurs, Sat & Sunday.  He is taking Metformin 500mg  XR, one tablet with breakfast, two tablets with dinner and one at bedtime.  He reports he typically forgets the lunch time medication so he changed it to bedtime.   Last hemoglobin A1c was: Lab Results  Component Value Date   HGBA1C 9.5 (H) 09/01/2019   HGBA1C 7.4 (H) 05/20/2019   HGBA1C 8.4 (H) 02/15/2019    Body mass index is 26.73 kg/m.  Pt is on a regimen of: Glimepride 4mg  Tues, Thurs, Sat, Sun.  He take metformin 500mg  XR one tablets at breakfast, two tablets at dinner and one tablet at bedtime.  Pt checks his sugars 1 x day  Lowest sugar was 112.  He has hypoglycemia awareness.  Highest sugar was 276.  Glucometer: Freestyle Freedom Lite  *He received letter from insurance that they will no longer  cover his test strips.  He has used this monitor for many years.  Exercise: He has been doing lots of work on his property cutting up trees and work around yard and house.   Patient does have CKD He is on ACE/ARB  Lab Results  Component Value Date   GFRAA 51 (L) 09/01/2019      Lab Results  Component Value Date   CREATININE 1.46 (H) 09/01/2019   BUN 17 09/01/2019   NA 130 (L) 09/01/2019   K 4.8 09/01/2019   CL 95 (L) 09/01/2019   CO2 28 09/01/2019    Lab Results  Component Value Date   MICROALBUR 5.0 09/01/2019     He is on a simvastatin 40mg  nightly and fenofibrate 134mg  dialy.  He is at goal of less than 70.  He was at goal last OV. Lab Results  Component Value Date   CHOL 135 09/01/2019   HDL 46 09/01/2019   LDLCALC 66 09/01/2019   TRIG 145 09/01/2019   CHOLHDL 2.9 09/01/2019     Problem List has Aneurysm of abdominal vessel ([place]); Hyperlipidemia associated with type 2 diabetes mellitus ([place]); Hypertension; Vitamin D deficiency; COPD (chronic obstructive pulmonary disease) ([place]); Medication management; Type II diabetes mellitus with CKD 3 (GFR 58 ml/min)  (HCC); ASCAD s/p PTCA/Stents; Overweight (BMI 25.0-29.9); Encounter for Medicare annual wellness exam; Atherosclerosis of aorta ([place]); PAD (  peripheral artery disease) ([place]); Vertigo; CKD stage 3 due to type 2 diabetes mellitus ([place]); Former smoker; Actinic keratoses; and Diabetic mononeuropathy associated with type 2 diabetes mellitus ([place]) on their problem list.  Medications Current Outpatient Medications on File Prior to Visit  Medication Sig  . aspirin 81 MG tablet Take 81 mg by mouth daily.  . Blood Glucose Monitoring Suppl DEVI Please provide glucometer per insurance coverage.  Insurance no longer covers supplies for current Freestyle Freedom lite.  Check blood glucose three times a day and as needed.  . Cholecalciferol (VITAMIN D-3) 1000 UNITS CAPS Take 5,000 Units by mouth daily.  . fenofibrate micronized  (LOFIBRA) 134 MG capsule Take 1 capsule Daily for Triglycerides (Blood Fats)  . glucose blood (FREESTYLE LITE) test strip USE 1 STRIP TO CHECK GLUCOSE ONCE DAILY. Dx: E11.29  . glucose blood test strip Use as instructed.  Check blood glucose three times a day and as needed.  [name] losartan (COZAAR) 50 MG tablet Take 1 tablet Daily for BP  . Magnesium 400 MG CAPS Take 400 mg by mouth daily.  . simvastatin (ZOCOR) 40 MG tablet TAKE 1 TABLET(40 MG) BY MOUTH DAILY   No current facility-administered medications on file prior to visit.    ROS- see HPI  Physical Exam: Blood pressure 140/74, pulse (!) 58, temperature (!) 97.5 F (36.4 C), height 6' 0.5" (1.842 m), weight 199 lb 12.8 oz (90.6 kg), SpO2 97 %. Body mass index is 26.73 kg/m.  General Appearance: Well nourished, in no apparent distress. Eyes: PERRLA, EOMs, conjunctiva no swelling or erythema ENT/Mouth: Ext aud canals clear, TMs without erythema, bulging. No erythema, swelling, or exudate on post pharynx.  Tonsils not swollen or erythematous. Hearing normal.  Respiratory: Respiratory effort normal, BS equal bilaterally without rales, rhonchi, wheezing or stridor.  Cardio: RRR with no MRGs. Brisk peripheral pulses without edema.  Abdomen: Soft, + BS.  Non tender, no guarding, rebound, hernias, masses. Musculoskeletal: Full ROM, 5/5 strength, normal gait.  Skin: Warm, dry without rashes, lesions, ecchymosis.  Neuro: Cranial nerves intact. Normal muscle tone, no cerebellar symptoms. Sensation intact.    Plan and Assessment: Diabetes Education:  Reviewed 'ABCs' of diabetes management (respective goals in parentheses):  A1C (<7), blood pressure (<130/80), and cholesterol (LDL <70) Eye Exam yearly and Dental Exam every 6 months. Dietary recommendations Physical Activity recommendations - Strongly advised him to start checking sugars at different times of the day - check 2 times a day, rotating checks - given sugar log and advised how to  fill it and to bring it at next appt  - given foot care handout and explained the principles  - given instructions for hypoglycemia management   Change the way you take the glimepride 4mg , take one tablet daily with breakfast.  Check blood glucose twice a day, fasting and second time in evening before dinner, post prandial or at bedtime.  Record this Also record food you are eating. Sheet for documentation provided Printed educational material on diet provided. Follow up in two weeks with logs for review.  Type 2 diabetes mellitus with stage 3 chronic kidney disease, without long-term current use of insulin (HCC) Hyperglycemia due to DMII (HCC) -     metFORMIN (GLUCOPHAGE-XR) 500 MG 24 hr tablet; Take two tablets with breakfast and two tablets with dinner, for diabetes.  -     glimepiride (AMARYL) 4 MG tablet; Take one tablet daily with breakfast for blood glucose, diabetes.  Check blood glucose BID, fasting and  second time during the day. Record log provided Discussed keeping food journal Dietary modifications and food choices/replacements discussed Bring log to follow up. Contact office with any difficulties obtaining new glucometer per insurance.    Future Appointments  Date Time Provider [place]  12/08/2019  9:00 AM Liane Comber, NP [facility] None  03/22/2020  9:30 AM [name], MD [facility] None  08/25/2020 10:30 AM Liane Comber, NP [facility] None      Garnet Sierras, NP   10/14/2019

## 2019-11-03 ENCOUNTER — Ambulatory Visit: Payer: Medicare Other | Admitting: Adult Health

## 2019-11-25 ENCOUNTER — Ambulatory Visit: Payer: Medicare Other | Admitting: Adult Health

## 2019-12-07 NOTE — Progress Notes (Signed)
MEDICARE ANNUAL WELLNESS VISIT AND FOLLOW UP Assessment:   Diagnoses and all orders for this visit:  Encounter for Medicare annual wellness exam Has had covid 19 vaccines - record requested Declines other vaccines Otherwise UTD  PAD (peripheral artery disease) ([place]) Control blood pressure, cholesterol, glucose, increase exercise.   Essential hypertension Labile; take losartan 50 mg 1 tab if <140/80, 2 tabs if running higher Monitor blood pressure at home; call if consistently above goal (140/90) despite increased dose Continue DASH diet.   Reminder to go to the ER if any CP, SOB, nausea, dizziness, severe HA, changes vision/speech, left arm numbness and tingling and jaw pain.  Atherosclerosis of aorta ([place]) Per CT 07/2018 Control blood pressure, cholesterol, glucose, increase exercise.   ASCAD s/p PTCA/Stents Control blood pressure, cholesterol, glucose, increase exercise.  Followed by cardiology  Aneurysm of abdominal vessel ([place]) S/p repair; control BP; followed by [facility]; stable on follow up imaging and was released by vascular -   Chronic bronchitis, unspecified chronic bronchitis type ([place]) Per imaging; Has stopped smoking, avoid triggers, no symptoms at this time.   Type 2 diabetes mellitus with stage 3 chronic kidney disease, without long-term current use of insulin (HCC) Education: Reviewed ABCs of diabetes management (respective goals in parentheses):  A1C (<7), blood pressure (<130/80), and cholesterol (LDL <70) Continue metformin 500 mg, likely reduce to 2 tabs/day, titrate glimepiride if needed Eye Exam yearly and Dental Exam every 6 months - UTD Dietary recommendations Physical Activity recommendations  CKD stage 3 due to type 2 diabetes mellitus ([place]) If GFR trending down further will reduce metformin  Increase fluids, avoid NSAIDS, monitor sugars, will monitor Check CMP/GFR  Vitamin D deficiency At goal at recent check; continue to recommend  supplementation for goal of 70-100 Defer vitamin D level  Vertigo Hasn't had symptoms recently; has meclizine if needed   Medication management CBC, CMP/GFR, magnesium  Mixed hyperlipidemia associated with T2DM (HCC) Continue statin for LDL goal <70 Continue low cholesterol diet and exercise.  Check lipid panel.   BMI 26 Long discussion about weight loss, diet, and exercise Recommended diet heavy in fruits and veggies and low in animal meats, cheeses, and dairy products, appropriate calorie intake Discussed appropriate weight for height  Follow up at next visit   Over 30 minutes of exam, counseling, chart review, and critical decision making was performed  Future Appointments  Date Time Provider [place]  03/22/2020  9:30 AM [name], MD [facility] None  08/25/2020 10:30 AM Liane Comber, NP [facility] None    Plan:   During the course of the visit the patient was educated and counseled about appropriate screening and preventive services including:    Pneumococcal vaccine   Influenza vaccine  Prevnar 13  Td vaccine  Screening electrocardiogram  Colorectal cancer screening  Diabetes screening  Glaucoma screening  Nutrition counseling    Subjective:  Isaiah Jackson is a 86 y.o. male who presents for Medicare Annual Wellness Visit and 3 month follow up for HTN, hyperlipidemia, T2DM, and vitamin D Def.   In 1996 he had PTCA/stents and in 2006 he had a negative Cardiolite.  He has hx of AAA s/p stent graft by Dr. Oneida Alar which was stable for several years monitored by [facility] and has since been released. He is a former smoker, 42 pack year history, quit in 1991. He had CT of chest in 07/2018 which showed aortic atherosclerosis and emphysema without concerning nodules.   BMI is Body mass index  is 26.75 kg/m., he has not been working on diet and exercise, but reports very active 3+ hours a day working on his property and caring for  animals.  Wt Readings from Last 3 Encounters:  12/08/19 200 lb (90.7 kg)  10/14/19 199 lb 12.8 oz (90.6 kg)  09/30/19 203 lb (92.1 kg)   His blood pressure has reportedly been labile, average 412I-786V/67M, systolic has been as low as 90 and high as 180 in the last 2 weeks per patient, today their BP is BP: (!) 146/78, similar by manual recheck.  He does not workout. He denies chest pain, shortness of breath, dizziness.   He is on cholesterol medication (simvastatin 40 mg every other day) and denies myalgias. His LDL cholesterol is at goal. The cholesterol last visit was:   Lab Results  Component Value Date   CHOL 135 09/01/2019   HDL 46 09/01/2019   LDLCALC 66 09/01/2019   TRIG 145 09/01/2019   CHOLHDL 2.9 09/01/2019   He has been working on diet and exercise for T2DM (on metformin 500 mg x 4, glimepiride 4 mg daily in AM), and denies foot ulcerations, hypoglycemia , increased appetite, nausea, paresthesia of the feet, polydipsia, polyuria, visual disturbances, vomiting and weight loss.  He does check occasional fasting blood sugars, reports running between 115-140s. Rare up to 200. Last A1C in the office was:  Lab Results  Component Value Date   HGBA1C 9.5 (H) 09/01/2019   He has CKD III associated with T2DM monitored at this office:  Lab Results  Component Value Date   GFRNONAA 44 (L) 09/01/2019  Estimates 3-4 bottles of water daily.    Patient is on Vitamin D supplement and near goal at last check:    Lab Results  Component Value Date   VD25OH 27 09/01/2019      Medication Review: Current Outpatient Medications on File Prior to Visit  Medication Sig Dispense Refill   aspirin 81 MG tablet Take 81 mg by mouth daily.     Blood Glucose Monitoring Suppl DEVI Please provide glucometer per insurance coverage.  Insurance no longer covers supplies for current Freestyle Freedom lite.  Check blood glucose three times a day and as needed. 1 each 0   Cholecalciferol (VITAMIN D-3)  1000 UNITS CAPS Take 5,000 Units by mouth daily.     fenofibrate micronized (LOFIBRA) 134 MG capsule Take 1 capsule Daily for Triglycerides (Blood Fats) 90 capsule 3   glimepiride (AMARYL) 4 MG tablet Take one tablet daily with breakfast for blood glucose, diabetes. 60 tablet 2   glucose blood (FREESTYLE LITE) test strip USE 1 STRIP TO CHECK GLUCOSE ONCE DAILY. Dx: E11.29 100 each 0   glucose blood test strip Use as instructed.  Check blood glucose three times a day and as needed. 100 each 4   Magnesium 400 MG CAPS Take 400 mg by mouth daily.     metFORMIN (GLUCOPHAGE-XR) 500 MG 24 hr tablet Take two tablets with breakfast and two tablets with dinner, for diabetes. 360 tablet 3   simvastatin (ZOCOR) 40 MG tablet TAKE 1 TABLET(40 MG) BY MOUTH DAILY 90 tablet 1   No current facility-administered medications on file prior to visit.    Allergies: Allergies  Allergen Reactions   Capoten [Captopril]     Fatigue/depression   Lamisil [Terbinafine] Rash    Current Problems (verified) has Aneurysm of abdominal vessel ([place]); Hyperlipidemia associated with type 2 diabetes mellitus ([place]); Hypertension; Vitamin D deficiency; COPD (chronic obstructive pulmonary disease) (  [place]); Medication management; Type II diabetes mellitus with CKD 3 (GFR 58 ml/min)  ([place]); ASCAD s/p PTCA/Stents; Overweight (BMI 25.0-29.9); Encounter for Medicare annual wellness exam; Atherosclerosis of aorta ([place]); PAD (peripheral artery disease) ([place]); Vertigo; CKD stage 3 due to type 2 diabetes mellitus ([place]); Former smoker; Actinic keratoses; and Diabetic mononeuropathy associated with type 2 diabetes mellitus ([place]) on their problem list.  Screening Tests Immunization History  Administered Date(s) Administered   Influenza Whole 06/16/2012, 03/26/2013   Influenza, High Dose Seasonal PF 04/19/2014, 04/26/2015, 04/23/2016, 04/17/2017, 04/28/2018, 03/31/2019   Td 05/26/2003   Preventative care: Last colonoscopy: 2014  per patient, was advised none further  CXR 07/2018 Carotid Doppler neg 2013 Stress test 2002  AB aorta 2014 CT chest 07/2018 - emphysema, aortic atherosclerosis  Prior vaccinations: TD or Tdap: 2004, declines further Influenza: 03/2019 Pneumococcal: 2000, declines Prevnar 13: declines Shingles/Zostavax: declines Covid 19: 2/2, 2021 [facility], patient will call with information  Names of Other Physician/Practitioners you currently use: 1. [facility] here for primary care [facility]. [facility], last visit 06/13/2019 - report verified and abstracted  3. , dentist, last visit 2018 - full dentures  4. Dr. Allyson Sabal, derm, last visit 2019, overdue, has scheduled this Friday with new Dr.   Patient Care Team: [name], MD as PCP - General (Internal Medicine) Jacolyn Reedy, MD as Consulting Physician (Cardiology) Elam Dutch, MD as Consulting Physician (Vascular Surgery) Laurence Spates, MD (Inactive) as Consulting Physician (Gastroenterology) Druscilla Brownie, MD as Consulting Physician (Dermatology)  Surgical: He  has a past surgical history that includes Abdominal aortic aneurysm repair (10/23/2005); cytoscopy; Shoulder surgery (Right); and Cataract extraction, bilateral (Bilateral, 2021). Family His family history includes Diabetes in his daughter, mother, and sister; Heart attack (age of onset: 38) in his mother; Heart disease in his father and mother. Social history  He reports that he quit smoking about 30 years ago. His smoking use included cigarettes. He has a 42.00 pack-year smoking history. He has never used smokeless tobacco. He reports that he does not drink alcohol or use drugs.  MEDICARE WELLNESS OBJECTIVES: Physical activity: Current Exercise Habits: The patient has a physically strenuous job, but has no regular exercise apart from work., Exercise limited by: None identified Cardiac risk factors: Cardiac Risk Factors include:  advanced age (>77men, >55 women);dyslipidemia;hypertension;male gender;diabetes mellitus;smoking/ tobacco exposure Depression/mood screen:   Depression screen [facility] 2/9 12/08/2019  Decreased Interest 0  Down, Depressed, Hopeless 0  PHQ - 2 Score 0    ADLs:  In your present state of health, do you have any difficulty performing the following activities: 12/08/2019 02/14/2019  Hearing? Y N  Comment has hearing aids via the [place], doesn't like to wear -  Vision? N N  Difficulty concentrating or making decisions? N N  Walking or climbing stairs? N N  Dressing or bathing? N N  Doing errands, shopping? N N  Some recent data might be hidden     Cognitive Testing  Alert? Yes  Normal Appearance?Yes  Oriented to person? Yes  Place? Yes   Time? Yes  Recall of three objects?  Yes  Can perform simple calculations? Yes  Displays appropriate judgment?Yes  Can read the correct time from a watch face?Yes  EOL planning: Does Patient Have a Medical Advance Directive?: Yes Type of Advance Directive: [place] will Does patient want to make changes to medical advance directive?: No - Patient declined Copy of [place]  in Chart?: No - copy requested   Objective:   Today's Vitals   12/08/19 0848  BP: (!) 146/78  Pulse: (!) 57  Temp: (!) 97.5 F (36.4 C)  SpO2: 97%  Weight: 200 lb (90.7 kg)  Height: 6' 0.5" (1.842 m)   Body mass index is 26.75 kg/m.  General appearance: alert, no distress, WD/WN, male HEENT: normocephalic, sclerae anicteric, TMs pearly, nares patent, no discharge or erythema, pharynx normal Oral cavity: MMM, no lesions. HOH, not wearing his hearing aids.  Neck: supple, no lymphadenopathy, no thyromegaly, no masses Heart: RRR, normal S1, S2, no murmurs Lungs: CTA bilaterally, no wheezes, rhonchi, or rales Abdomen: +bs, soft, non tender, non distended, no masses, no hepatomegaly, no splenomegaly Musculoskeletal: nontender, no swelling,  no obvious deformity Extremities: no edema, no cyanosis, no clubbing Pulses: 2+ symmetric, upper and lower extremities, normal cap refill Neurological: alert, oriented x 3, CN2-12 intact, strength normal upper extremities and lower extremities, sensation normal throughout, DTRs 2+ throughout, no cerebellar signs, gait slow steady Psychiatric: normal affect, behavior normal, pleasant   Medicare Attestation I have personally reviewed: The patient's medical and social history Their use of alcohol, tobacco or illicit drugs Their current medications and supplements The patient's functional ability including ADLs,fall risks, home safety risks, cognitive, and hearing and visual impairment Diet and physical activities Evidence for depression or mood disorders  The patient's weight, height, BMI, and visual acuity have been recorded in the chart.  I have made referrals, counseling, and provided education to the patient based on review of the above and I have provided the patient with a written personalized care plan for preventive services.     Izora Ribas, NP   12/08/2019

## 2019-12-08 ENCOUNTER — Other Ambulatory Visit: Payer: Self-pay

## 2019-12-08 ENCOUNTER — Ambulatory Visit (INDEPENDENT_AMBULATORY_CARE_PROVIDER_SITE_OTHER): Payer: Medicare Other | Admitting: Adult Health

## 2019-12-08 ENCOUNTER — Encounter: Payer: Self-pay | Admitting: Adult Health

## 2019-12-08 VITALS — BP 146/78 | HR 57 | Temp 97.5°F | Ht 72.5 in | Wt 200.0 lb

## 2019-12-08 DIAGNOSIS — Z87891 Personal history of nicotine dependence: Secondary | ICD-10-CM

## 2019-12-08 DIAGNOSIS — I10 Essential (primary) hypertension: Secondary | ICD-10-CM

## 2019-12-08 DIAGNOSIS — I714 Abdominal aortic aneurysm, without rupture, unspecified: Secondary | ICD-10-CM

## 2019-12-08 DIAGNOSIS — R6889 Other general symptoms and signs: Secondary | ICD-10-CM | POA: Diagnosis not present

## 2019-12-08 DIAGNOSIS — E785 Hyperlipidemia, unspecified: Secondary | ICD-10-CM | POA: Diagnosis not present

## 2019-12-08 DIAGNOSIS — J42 Unspecified chronic bronchitis: Secondary | ICD-10-CM | POA: Diagnosis not present

## 2019-12-08 DIAGNOSIS — I2510 Atherosclerotic heart disease of native coronary artery without angina pectoris: Secondary | ICD-10-CM

## 2019-12-08 DIAGNOSIS — E559 Vitamin D deficiency, unspecified: Secondary | ICD-10-CM

## 2019-12-08 DIAGNOSIS — E1122 Type 2 diabetes mellitus with diabetic chronic kidney disease: Secondary | ICD-10-CM

## 2019-12-08 DIAGNOSIS — I700 Atherosclerosis of aorta: Secondary | ICD-10-CM | POA: Diagnosis not present

## 2019-12-08 DIAGNOSIS — E1169 Type 2 diabetes mellitus with other specified complication: Secondary | ICD-10-CM | POA: Diagnosis not present

## 2019-12-08 DIAGNOSIS — Z0000 Encounter for general adult medical examination without abnormal findings: Secondary | ICD-10-CM

## 2019-12-08 DIAGNOSIS — N183 Chronic kidney disease, stage 3 unspecified: Secondary | ICD-10-CM

## 2019-12-08 DIAGNOSIS — R42 Dizziness and giddiness: Secondary | ICD-10-CM

## 2019-12-08 DIAGNOSIS — E663 Overweight: Secondary | ICD-10-CM

## 2019-12-08 DIAGNOSIS — Z0001 Encounter for general adult medical examination with abnormal findings: Secondary | ICD-10-CM

## 2019-12-08 DIAGNOSIS — I739 Peripheral vascular disease, unspecified: Secondary | ICD-10-CM

## 2019-12-08 DIAGNOSIS — Z79899 Other long term (current) drug therapy: Secondary | ICD-10-CM

## 2019-12-08 MED ORDER — LOSARTAN POTASSIUM 50 MG PO TABS: ORAL_TABLET | ORAL | 1 refills | Status: DC

## 2019-12-08 NOTE — Patient Instructions (Addendum)
  Isaiah Jackson , Thank you for taking time to come for your Medicare Wellness Visit. I appreciate your ongoing commitment to your health goals. Please review the following plan we discussed and let me know if I can assist you in the future.   These are the goals we discussed: Goals    . DIET - INCREASE WATER INTAKE     Aim for 5-6 bottles of water daily     . Exercise - walk 20 min daily    . fasting glucose <150     Check fasting (morning) sugar daily and write down on a log  Check blood sugar later in the day only if feeling bad    . Weight (lb) < 195 lb (88.5 kg)       This is a list of the screening recommended for you and due dates:  Health Maintenance  Topic Date Due  . COVID-19 Vaccine (1) Never done  . Flu Shot  02/06/2020  . Hemoglobin A1C  02/29/2020  . Eye exam for diabetics  06/22/2020  . Complete foot exam   08/31/2020  . Tetanus Vaccine  Discontinued  . Pneumonia vaccines  Discontinued     Please call with your covid 19 vaccine information after you get home  Check your blood pressure daily before taking medication - if <140/80 take 1 tab, if 14080+ take 2 tabs of losartan   Increase water intake to 5 bottles daily   Due to declining kidney function, may REDUCE metformin and increase glimepiride tomorrow morning -   Please start bringing hearing aids with you to appointments

## 2019-12-09 ENCOUNTER — Other Ambulatory Visit: Payer: Self-pay | Admitting: Adult Health

## 2019-12-09 DIAGNOSIS — E1122 Type 2 diabetes mellitus with diabetic chronic kidney disease: Secondary | ICD-10-CM

## 2019-12-09 LAB — COMPLETE METABOLIC PANEL WITH GFR
AG Ratio: 1.5 (calc) (ref 1.0–2.5)
ALT: 12 U/L (ref 9–46)
AST: 17 U/L (ref 10–35)
Albumin: 4.2 g/dL (ref 3.6–5.1)
Alkaline phosphatase (APISO): 82 U/L (ref 35–144)
BUN/Creatinine Ratio: 15 (calc) (ref 6–22)
BUN: 21 mg/dL (ref 7–25)
CO2: 29 mmol/L (ref 20–32)
Calcium: 10.6 mg/dL — ABNORMAL HIGH (ref 8.6–10.3)
Chloride: 95 mmol/L — ABNORMAL LOW (ref 98–110)
Creat: 1.44 mg/dL — ABNORMAL HIGH (ref 0.70–1.11)
GFR, Est African American: 52 mL/min/{1.73_m2} — ABNORMAL LOW (ref >=60)
GFR, Est Non African American: 45 mL/min/{1.73_m2} — ABNORMAL LOW (ref >=60)
Globulin: 2.8 g/dL (calc) (ref 1.9–3.7)
Glucose, Bld: 259 mg/dL — ABNORMAL HIGH (ref 65–99)
Potassium: 5.3 mmol/L (ref 3.5–5.3)
Sodium: 132 mmol/L — ABNORMAL LOW (ref 135–146)
Total Bilirubin: 0.7 mg/dL (ref 0.2–1.2)
Total Protein: 7 g/dL (ref 6.1–8.1)

## 2019-12-09 LAB — CBC WITH DIFFERENTIAL/PLATELET
Absolute Monocytes: 639 cells/uL (ref 200–950)
Basophils Absolute: 68 cells/uL (ref 0–200)
Basophils Relative: 1 %
Eosinophils Absolute: 150 cells/uL (ref 15–500)
Eosinophils Relative: 2.2 %
HCT: 54.4 % — ABNORMAL HIGH (ref 38.5–50.0)
Hemoglobin: 17.1 g/dL (ref 13.2–17.1)
Lymphs Abs: 2074 cells/uL (ref 850–3900)
MCH: 24.6 pg — ABNORMAL LOW (ref 27.0–33.0)
MCHC: 31.4 g/dL — ABNORMAL LOW (ref 32.0–36.0)
MCV: 78.4 fL — ABNORMAL LOW (ref 80.0–100.0)
MPV: 11.2 fL (ref 7.5–12.5)
Monocytes Relative: 9.4 %
Neutro Abs: 3869 cells/uL (ref 1500–7800)
Neutrophils Relative %: 56.9 %
Platelets: 270 10*3/uL (ref 140–400)
RBC: 6.94 10*6/uL — ABNORMAL HIGH (ref 4.20–5.80)
RDW: 17 % — ABNORMAL HIGH (ref 11.0–15.0)
Total Lymphocyte: 30.5 %
WBC: 6.8 10*3/uL (ref 3.8–10.8)

## 2019-12-09 LAB — LIPID PANEL
Cholesterol: 133 mg/dL (ref <200)
HDL: 45 mg/dL (ref >=40)
LDL Cholesterol (Calc): 66 mg/dL (calc)
Non-HDL Cholesterol (Calc): 88 mg/dL (calc) (ref <130)
Total CHOL/HDL Ratio: 3 (calc) (ref <5.0)
Triglycerides: 138 mg/dL (ref <150)

## 2019-12-09 LAB — MAGNESIUM: Magnesium: 2.1 mg/dL (ref 1.5–2.5)

## 2019-12-09 LAB — HEMOGLOBIN A1C
Hgb A1c MFr Bld: 7.5 % of total Hgb — ABNORMAL HIGH (ref <5.7)
Mean Plasma Glucose: 169 (calc)
eAG (mmol/L): 9.3 (calc)

## 2019-12-09 LAB — TSH: TSH: 2.26 mIU/L (ref 0.40–4.50)

## 2019-12-09 MED ORDER — METFORMIN HCL ER 500 MG PO TB24: ORAL_TABLET | ORAL | 3 refills | Status: DC

## 2019-12-09 MED ORDER — GLIMEPIRIDE 4 MG PO TABS: ORAL_TABLET | ORAL | 2 refills | Status: DC

## 2019-12-10 DIAGNOSIS — L570 Actinic keratosis: Secondary | ICD-10-CM | POA: Diagnosis not present

## 2019-12-10 DIAGNOSIS — C4441 Basal cell carcinoma of skin of scalp and neck: Secondary | ICD-10-CM | POA: Diagnosis not present

## 2019-12-10 DIAGNOSIS — D225 Melanocytic nevi of trunk: Secondary | ICD-10-CM | POA: Diagnosis not present

## 2019-12-10 DIAGNOSIS — X32XXXA Exposure to sunlight, initial encounter: Secondary | ICD-10-CM | POA: Diagnosis not present

## 2020-01-14 DIAGNOSIS — C4441 Basal cell carcinoma of skin of scalp and neck: Secondary | ICD-10-CM | POA: Diagnosis not present

## 2020-01-14 DIAGNOSIS — D044 Carcinoma in situ of skin of scalp and neck: Secondary | ICD-10-CM | POA: Diagnosis not present

## 2020-01-14 DIAGNOSIS — X32XXXD Exposure to sunlight, subsequent encounter: Secondary | ICD-10-CM | POA: Diagnosis not present

## 2020-01-14 DIAGNOSIS — L570 Actinic keratosis: Secondary | ICD-10-CM | POA: Diagnosis not present

## 2020-01-17 ENCOUNTER — Other Ambulatory Visit: Payer: Self-pay | Admitting: Adult Health

## 2020-01-17 ENCOUNTER — Other Ambulatory Visit: Payer: Self-pay | Admitting: Internal Medicine

## 2020-01-17 DIAGNOSIS — E1122 Type 2 diabetes mellitus with diabetic chronic kidney disease: Secondary | ICD-10-CM

## 2020-01-17 MED ORDER — METFORMIN HCL ER 500 MG PO TB24: ORAL_TABLET | ORAL | 0 refills | Status: DC

## 2020-02-25 DIAGNOSIS — L570 Actinic keratosis: Secondary | ICD-10-CM | POA: Diagnosis not present

## 2020-02-25 DIAGNOSIS — Z85828 Personal history of other malignant neoplasm of skin: Secondary | ICD-10-CM | POA: Diagnosis not present

## 2020-02-25 DIAGNOSIS — X32XXXD Exposure to sunlight, subsequent encounter: Secondary | ICD-10-CM | POA: Diagnosis not present

## 2020-02-25 DIAGNOSIS — Z08 Encounter for follow-up examination after completed treatment for malignant neoplasm: Secondary | ICD-10-CM | POA: Diagnosis not present

## 2020-03-04 ENCOUNTER — Other Ambulatory Visit: Payer: Self-pay | Admitting: Adult Health

## 2020-03-22 ENCOUNTER — Encounter: Payer: Self-pay | Admitting: Internal Medicine

## 2020-03-22 ENCOUNTER — Other Ambulatory Visit: Payer: Self-pay

## 2020-03-22 ENCOUNTER — Ambulatory Visit (INDEPENDENT_AMBULATORY_CARE_PROVIDER_SITE_OTHER): Payer: Medicare Other | Admitting: Internal Medicine

## 2020-03-22 VITALS — BP 126/72 | HR 60 | Temp 97.0°F | Resp 16 | Ht 72.5 in | Wt 199.0 lb

## 2020-03-22 DIAGNOSIS — E1121 Type 2 diabetes mellitus with diabetic nephropathy: Secondary | ICD-10-CM | POA: Diagnosis not present

## 2020-03-22 DIAGNOSIS — E1169 Type 2 diabetes mellitus with other specified complication: Secondary | ICD-10-CM

## 2020-03-22 DIAGNOSIS — I10 Essential (primary) hypertension: Secondary | ICD-10-CM | POA: Diagnosis not present

## 2020-03-22 DIAGNOSIS — E785 Hyperlipidemia, unspecified: Secondary | ICD-10-CM

## 2020-03-22 DIAGNOSIS — N1831 Type 2 diabetes mellitus with diabetic chronic kidney disease: Secondary | ICD-10-CM

## 2020-03-22 DIAGNOSIS — E1122 Type 2 diabetes mellitus with diabetic chronic kidney disease: Secondary | ICD-10-CM

## 2020-03-22 DIAGNOSIS — E559 Vitamin D deficiency, unspecified: Secondary | ICD-10-CM

## 2020-03-22 DIAGNOSIS — I2510 Atherosclerotic heart disease of native coronary artery without angina pectoris: Secondary | ICD-10-CM

## 2020-03-22 DIAGNOSIS — Z79899 Other long term (current) drug therapy: Secondary | ICD-10-CM

## 2020-03-22 NOTE — Patient Instructions (Signed)

## 2020-03-22 NOTE — Progress Notes (Signed)
History of Present Illness:       This very nice 86 y.o.  WWM presents for 6 month follow up with HTN, HLD, Pre-Diabetes and Vitamin D Deficiency.        Patient is treated for HTN (1994) & BP has been controlled at home. Today's BP is at goal - 126/72.    In 1996, patient underwent a PTCA and in 2007 he had a Negative Cardiolite scan. In 2007, he underwent an AAA repair and has done well since. [name] atient has had no complaints of any cardiac type chest pain, palpitations, dyspnea / orthopnea / PND, dizziness, claudication, or dependent edema.      Hyperlipidemia is controlled with diet & Simvastatin Venia Minks. Patient denies myalgias or other med SE's. Last Lipids were at goal:  Lab Results  Component Value Date   CHOL 133 12/08/2019   HDL 45 12/08/2019   LDLCALC 66 12/08/2019   TRIG 138 12/08/2019   CHOLHDL 3.0 12/08/2019    Also, the patient has history of T2_NIDDM (2003) and CKD3a  (GFR 45).  Patient denies symptoms of reactive hypoglycemia, diabetic polys, paresthesias or visual blurring.  Last A1c was not at goal:  Lab Results  Component Value Date   HGBA1C 7.5 (H) 12/08/2019           Further, the patient also has history of Vitamin D Deficiency   ("33"/2012) and supplements vitamin D without any suspected side-effects. Last vitamin D was at goal:  Lab Results  Component Value Date   VD25OH 24 09/01/2019    Current Outpatient Medications on File Prior to Visit  Medication Sig  . aspirin 81 MG tablet Take 81 mg by mouth daily.  . Blood Glucose Monitoring Suppl DEVI Please provide glucometer per insurance coverage.  Insurance no longer covers supplies for current Freestyle Freedom lite.  Check blood glucose three times a day and as needed.  . Cholecalciferol (VITAMIN D-3) 1000 UNITS CAPS Take 5,000 Units by mouth daily.  . fenofibrate micronized (LOFIBRA) 134 MG capsule Take 1 capsule Daily for Triglycerides (Blood Fats)  . glimepiride (AMARYL) 4 MG tablet Take  1 tablet Daily with Breakfast for Diabetes  . glucose blood (FREESTYLE LITE) test strip USE 1 STRIP TO CHECK GLUCOSE ONCE DAILY. Dx: E11.29  . glucose blood test strip Use as instructed.  Check blood glucose three times a day and as needed.  [name] losartan (COZAAR) 50 MG tablet Take 1-2 tablet Daily for BP. Take 2 tabs if blood pressure is greater than 140/80.  . Magnesium 400 MG CAPS Take 400 mg by mouth daily.  . metFORMIN (GLUCOPHAGE-XR) 500 MG 24 hr tablet Take 1 tablet with Breakfast and Lunch & 2 tablets with Supper for Diabetes  . simvastatin (ZOCOR) 40 MG tablet Take 1 tablet at Bedtime for Cholesterol   No current facility-administered medications on file prior to visit.    Allergies  Allergen Reactions  . Capoten [Captopril]     Fatigue/depression  . Lamisil [Terbinafine] Rash    PMHx:   Past Medical History:  Diagnosis Date  . Benign prostatic hypertrophy    history of post catheterization  . COPD (chronic obstructive pulmonary disease) ([place])   . Coronary artery disease    s/p PTA and stenting  . Diabetes mellitus   . Hyperlipidemia   . Hypertension   . Kidney stone   . Myocardial infarction ([place])   . Nephrolithiasis    history of cystoscopy  .  Vitamin D deficiency     Immunization History  Administered Date(s) Administered  . Influenza Whole 06/16/2012, 03/26/2013  . Influenza, High Dose Seasonal PF 04/19/2014, 04/26/2015, 04/23/2016, 04/17/2017, 04/28/2018, 03/31/2019  . Td 05/26/2003    Past Surgical History:  Procedure Laterality Date  . ABDOMINAL AORTIC ANEURYSM REPAIR  10/23/2005   endograft  . CATARACT EXTRACTION, BILATERAL Bilateral 2021   Dr. Katy Fitch  . cytoscopy     for kidney stones  . SHOULDER SURGERY Right     FHx:    Reviewed / unchanged  SHx:    Reviewed / unchanged   Systems Review:  Constitutional: Denies fever, chills, wt changes, headaches, insomnia, fatigue, night sweats, change in appetite. Eyes: Denies redness, blurred vision,  diplopia, discharge, itchy, watery eyes.  ENT: Denies discharge, congestion, post nasal drip, epistaxis, sore throat, earache, hearing loss, dental pain, tinnitus, vertigo, sinus pain, snoring.  CV: Denies chest pain, palpitations, irregular heartbeat, syncope, dyspnea, diaphoresis, orthopnea, PND, claudication or edema. Respiratory: denies cough, dyspnea, DOE, pleurisy, hoarseness, laryngitis, wheezing.  Gastrointestinal: Denies dysphagia, odynophagia, heartburn, reflux, water brash, abdominal pain or cramps, nausea, vomiting, bloating, diarrhea, constipation, hematemesis, melena, hematochezia  or hemorrhoids. Genitourinary: Denies dysuria, frequency, urgency, nocturia, hesitancy, discharge, hematuria or flank pain. Musculoskeletal: Denies arthralgias, myalgias, stiffness, jt. swelling, pain, limping or strain/sprain.  Skin: Denies pruritus, rash, hives, warts, acne, eczema or change in skin lesion(s). Neuro: No weakness, tremor, incoordination, spasms, paresthesia or pain. Psychiatric: Denies confusion, memory loss or sensory loss. Endo: Denies change in weight, skin or hair change.  Heme/Lymph: No excessive bleeding, bruising or enlarged lymph nodes.  Physical Exam  BP 126/72   Pulse 60   Temp (!) 97 F (36.1 C)   Resp 16   Ht 6' 0.5" (1.842 m)   Wt 199 lb (90.3 kg)   BMI 26.62 kg/m   Appears  well nourished, well groomed  and in no distress.  Eyes: PERRLA, EOMs, conjunctiva no swelling or erythema. Sinuses: No frontal/maxillary tenderness ENT/Mouth: EAC's clear, TM's nl w/o erythema, bulging. Nares clear w/o erythema, swelling, exudates. Oropharynx clear without erythema or exudates. Oral hygiene is good. Tongue normal, non obstructing. Hearing intact.  Neck: Supple. Thyroid not palpable. Car 2+/2+ without bruits, nodes or JVD. Chest: Respirations nl with BS clear & equal w/o rales, rhonchi, wheezing or stridor.  Cor: Heart sounds normal w/ regular rate and rhythm without sig.  murmurs, gallops, clicks or rubs. Peripheral pulses normal and equal  without edema.  Abdomen: Soft & bowel sounds normal. Non-tender w/o guarding, rebound, hernias, masses or organomegaly.  Lymphatics: Unremarkable.  Musculoskeletal: Full ROM all peripheral extremities, joint stability, 5/5 strength and normal gait.  Skin: Warm, dry without exposed rashes, lesions or ecchymosis apparent.  Neuro: Cranial nerves intact, reflexes equal bilaterally. Sensory-motor testing grossly intact. Tendon reflexes grossly intact.  Pysch: Alert & oriented x 3.  Insight and judgement nl & appropriate. No ideations.  Assessment and Plan:  1. Essential hypertension  - Continue medication, monitor blood pressure at home.  - Continue DASH diet.  Reminder to go to the ER if any CP,  SOB, nausea, dizziness, severe HA, changes vision/speech.  - CBC with Differential/Platelet - COMPLETE METABOLIC PANEL WITH GFR - Magnesium - TSH  2. Hyperlipidemia associated with type 2 diabetes mellitus ([place])  - Continue diet/meds, exercise,& lifestyle modifications.  - Continue monitor periodic cholesterol/liver & renal functions   - Lipid panel - TSH  3. Type 2 diabetes mellitus with stage 3a chronic kidney disease,  without long-term current use of insulin (HCC)  - Continue diet, exercise  - Lifestyle modifications.  - Monitor appropriate labs.  - Hemoglobin A1c - Insulin, random  4. Vitamin D deficiency  - Continue supplementation.  - VITAMIN D 25 Hydroxy  5. ASCAD s/p PTCA/Stents  - Lipid panel  6. Medication management  - CBC with Differential/Platelet - COMPLETE METABOLIC PANEL WITH GFR - Magnesium - Lipid panel - TSH - Hemoglobin A1c - Insulin, random - VITAMIN D 25 Hydroxy         Discussed  regular exercise, BP monitoring, weight control to achieve/maintain BMI less than 25 and discussed med and SE's. Recommended labs to assess and monitor clinical status with further disposition  pending results of labs.  I discussed the assessment and treatment plan with the patient. The patient was provided an opportunity to ask questions and all were answered. The patient agreed with the plan and demonstrated an understanding of the instructions.  I provided over 30 minutes of exam, counseling, chart review and  complex critical decision making.   Kirtland Bouchard, MD

## 2020-03-23 LAB — COMPLETE METABOLIC PANEL WITH GFR
AG Ratio: 1.5 (calc) (ref 1.0–2.5)
ALT: 12 U/L (ref 9–46)
AST: 16 U/L (ref 10–35)
Albumin: 4 g/dL (ref 3.6–5.1)
Alkaline phosphatase (APISO): 63 U/L (ref 35–144)
BUN/Creatinine Ratio: 13 (calc) (ref 6–22)
BUN: 24 mg/dL (ref 7–25)
CO2: 27 mmol/L (ref 20–32)
Calcium: 10 mg/dL (ref 8.6–10.3)
Chloride: 99 mmol/L (ref 98–110)
Creat: 1.78 mg/dL — ABNORMAL HIGH (ref 0.70–1.11)
GFR, Est African American: 40 mL/min/{1.73_m2} — ABNORMAL LOW (ref >=60)
GFR, Est Non African American: 34 mL/min/{1.73_m2} — ABNORMAL LOW (ref >=60)
Globulin: 2.7 g/dL (calc) (ref 1.9–3.7)
Glucose, Bld: 215 mg/dL — ABNORMAL HIGH (ref 65–99)
Potassium: 4.4 mmol/L (ref 3.5–5.3)
Sodium: 133 mmol/L — ABNORMAL LOW (ref 135–146)
Total Bilirubin: 0.8 mg/dL (ref 0.2–1.2)
Total Protein: 6.7 g/dL (ref 6.1–8.1)

## 2020-03-23 LAB — CBC WITH DIFFERENTIAL/PLATELET
Absolute Monocytes: 537 cells/uL (ref 200–950)
Basophils Absolute: 59 cells/uL (ref 0–200)
Basophils Relative: 1 %
Eosinophils Absolute: 159 cells/uL (ref 15–500)
Eosinophils Relative: 2.7 %
HCT: 51.8 % — ABNORMAL HIGH (ref 38.5–50.0)
Hemoglobin: 16.1 g/dL (ref 13.2–17.1)
Lymphs Abs: 1894 cells/uL (ref 850–3900)
MCH: 24 pg — ABNORMAL LOW (ref 27.0–33.0)
MCHC: 31.1 g/dL — ABNORMAL LOW (ref 32.0–36.0)
MCV: 77.2 fL — ABNORMAL LOW (ref 80.0–100.0)
MPV: 11.2 fL (ref 7.5–12.5)
Monocytes Relative: 9.1 %
Neutro Abs: 3251 cells/uL (ref 1500–7800)
Neutrophils Relative %: 55.1 %
Platelets: 256 10*3/uL (ref 140–400)
RBC: 6.71 10*6/uL — ABNORMAL HIGH (ref 4.20–5.80)
RDW: 17.8 % — ABNORMAL HIGH (ref 11.0–15.0)
Total Lymphocyte: 32.1 %
WBC: 5.9 10*3/uL (ref 3.8–10.8)

## 2020-03-23 LAB — LIPID PANEL
Cholesterol: 126 mg/dL (ref <200)
HDL: 42 mg/dL (ref >=40)
LDL Cholesterol (Calc): 65 mg/dL (calc)
Non-HDL Cholesterol (Calc): 84 mg/dL (calc) (ref <130)
Total CHOL/HDL Ratio: 3 (calc) (ref <5.0)
Triglycerides: 108 mg/dL (ref <150)

## 2020-03-23 LAB — INSULIN, RANDOM: Insulin: 15.3 u[IU]/mL

## 2020-03-23 LAB — MAGNESIUM: Magnesium: 2 mg/dL (ref 1.5–2.5)

## 2020-03-23 LAB — HEMOGLOBIN A1C
Hgb A1c MFr Bld: 8.2 % of total Hgb — ABNORMAL HIGH (ref <5.7)
Mean Plasma Glucose: 189 (calc)
eAG (mmol/L): 10.4 (calc)

## 2020-03-23 LAB — TSH: TSH: 1.73 mIU/L (ref 0.40–4.50)

## 2020-03-23 LAB — VITAMIN D 25 HYDROXY (VIT D DEFICIENCY, FRACTURES): Vit D, 25-Hydroxy: 70 ng/mL (ref 30–100)

## 2020-03-23 NOTE — Progress Notes (Signed)
°============================================================= ° °-    Kidney functions look worse - have dropped from   - Stage 3a GFR (flow rate) 45   - down to   - Stage 3b GFR (flow rate) 34  - So, since kidney function worse  - must cut Metformin dose in 1/2  - So.......[name] Decrease Metformin from    4 tabs / Day    down to 2 tabs / Day  - Also important to avoid arthritis meds like   Ibuprofen / Motrin or Aleve  / Naproxen  - Lastly, very important to drink more water  / Fluids to protect the Kidneys   - Recommend 6 bottles  (16 oz) = 96-100 oz  /day ! =============================================================  -  Total Chol = 126 -  Excellent   - Very low risk for Heart Attack  / Stroke =============================================================  - A1c is worse - up from 7.5% to Now 8.2% - reflects poor control and   eating too many calories -  - Poor sugar control will damage kidneys & may lead to needing to  start Dialysis Kidney machine treatments 3 x /week   - if don't do a better job at losing weight ! =============================================================  -  Vitamin D = 70 - Great  =============================================================  -  All Else - CBC - Electrolytes - Liver - Magnesium & Thyroid    - all  Normal / OK =============================================================

## 2020-03-31 DIAGNOSIS — C44311 Basal cell carcinoma of skin of nose: Secondary | ICD-10-CM | POA: Diagnosis not present

## 2020-04-28 DIAGNOSIS — C44311 Basal cell carcinoma of skin of nose: Secondary | ICD-10-CM | POA: Diagnosis not present

## 2020-06-02 ENCOUNTER — Other Ambulatory Visit: Payer: Self-pay | Admitting: Internal Medicine

## 2020-06-17 ENCOUNTER — Other Ambulatory Visit: Payer: Self-pay | Admitting: Adult Health

## 2020-06-25 NOTE — Progress Notes (Deleted)
FOLLOW UP 3 MONTH  Assessment / Plan:   Diagnoses and all orders for this visit:  Essential hypertension Labile; take losartan 50 mg 1 tab if <140/80, 2 tabs if running higher Monitor blood pressure at home; call if consistently above goal (140/90) despite increased dose Continue DASH diet.   Reminder to go to the ER if any CP, SOB, nausea, dizziness, severe HA, changes vision/speech, left arm numbness and tingling and jaw pain.  Atherosclerosis of aorta ([place]) Per CT 07/2018 Control blood pressure, cholesterol, glucose, increase exercise.   PAD (peripheral artery disease) (HCC) Control blood pressure, cholesterol, glucose, increase exercise.   ASCAD s/p PTCA/Stents Control blood pressure, cholesterol, glucose, increase exercise.  Followed by cardiology  Aneurysm of abdominal vessel ([place]) S/p repair; control BP; followed by [facility]; stable on follow up imaging and was released by vascular -   Chronic bronchitis, unspecified chronic bronchitis type ([place]) Per imaging; Has stopped smoking, avoid triggers, no symptoms at this time.   Type 2 diabetes mellitus with stage 3 chronic kidney disease, without long-term current use of insulin [facility] 2) Education: Reviewed 'ABCs' of diabetes management (respective goals in parentheses):  A1C (<7), blood pressure (<130/80), and cholesterol (LDL <70) Continue metformin 500 mg, likely reduce to 2 tabs/day, titrate glimepiride if needed Eye Exam yearly and Dental Exam every 6 months - UTD Dietary recommendations Physical Activity recommendations  CKD stage 3 due to type 2 diabetes mellitus ([place]) If GFR trending down further will reduce metformin  Increase fluids, avoid NSAIDS, monitor sugars, will monitor Check CMP/GFR  Vitamin D deficiency At goal at recent check; continue to recommend supplementation for goal of 70-100 Defer vitamin D level  Vertigo Hasn't had symptoms recently; has meclizine if needed   Medication  management CBC, CMP/GFR, magnesium  Mixed hyperlipidemia associated with T2DM (HCC) Continue statin for LDL goal <70 Continue low cholesterol diet and exercise.  Check lipid panel.   CKD stage 3 due to DMII ([place]) Continue glucose monitoring Continue medications Increase fluids Avoid NSAIDS Discussed dietary modifications and exercise Will continue to monitor   BMI 26 Long discussion about weight loss, diet, and exercise Recommended diet heavy in fruits and veggies and low in animal meats, cheeses, and dairy products, appropriate calorie intake Discussed appropriate weight for height  Follow up at next visit   Over 30 minutes of exam, counseling, chart review, and critical decision making was performed  Future Appointments  Date Time Provider [place]  06/26/2020  9:30 AM Garnet Sierras, NP [facility] None  10/09/2020  9:00 AM Liane Comber, NP [facility] None  01/15/2021 11:00 AM Garnet Sierras, NP [facility] None     HPI:  Isaiah Jackson is a 86 y.o. male who presents for 3 month follow up for HTN, HLD, T2DM, and vitamin D Def.   He reports that   In 1996 he had PTCA/stents and in 2006 he had a negative Cardiolite.  He has hx of AAA s/p stent graft by Dr. Oneida Alar which was stable for several years monitored by [facility] and has since been released. He is a former smoker, 42 pack year history, quit in 1991. He had CT of chest in 07/2018 which showed aortic atherosclerosis and emphysema without concerning nodules.   BMI is There is no height or weight on file to calculate BMI., he has not been working on diet and exercise, but reports very active 3+ hours a day working on his property and caring for animals.  Wt Readings from Last  3 Encounters:  03/22/20 199 lb (90.3 kg)  12/08/19 200 lb (90.7 kg)  10/14/19 199 lb 12.8 oz (90.6 kg)   His blood pressure has reportedly been labile, average 267T-245Y/09X, systolic has been as low as 90 and high as 180 in  the last 2 weeks per patient, today their BP is  , similar by manual recheck.  He does not workout. He denies chest pain, shortness of breath, dizziness.   He is on cholesterol medication (simvastatin 40 mg every other day) and denies myalgias. His LDL cholesterol is at goal. The cholesterol last visit was:   Lab Results  Component Value Date   CHOL 126 03/22/2020   HDL 42 03/22/2020   LDLCALC 65 03/22/2020   TRIG 108 03/22/2020   CHOLHDL 3.0 03/22/2020   He has been working on diet and exercise for T2DM (on metformin 500 mg x 4, glimepiride 4 mg daily in AM), and denies foot ulcerations, hypoglycemia , increased appetite, nausea, paresthesia of the feet, polydipsia, polyuria, visual disturbances, vomiting and weight loss.  He does check occasional fasting blood sugars, reports running between 115-140s. Rare up to 200. Last A1C in the office was:  Lab Results  Component Value Date   HGBA1C 8.2 (H) 03/22/2020   He has CKD III associated with T2DM monitored at this office:  Lab Results  Component Value Date   GFRNONAA 34 (L) 03/22/2020  Estimates 3-4 bottles of water daily.    Patient is on Vitamin D supplement and near goal at last check:    Lab Results  Component Value Date   VD25OH 44 03/22/2020      Medication Review: Current Outpatient Medications on File Prior to Visit  Medication Sig Dispense Refill  . aspirin 81 MG tablet Take 81 mg by mouth daily.    . Blood Glucose Monitoring Suppl DEVI Please provide glucometer per insurance coverage.  Insurance no longer covers supplies for current Freestyle Freedom lite.  Check blood glucose three times a day and as needed. 1 each 0  . Cholecalciferol (VITAMIN D-3) 1000 UNITS CAPS Take 5,000 Units by mouth daily.    . fenofibrate micronized (LOFIBRA) 134 MG capsule Take 1 capsule Daily for Triglycerides (Blood Fats) 90 capsule 3  . glimepiride (AMARYL) 4 MG tablet TAKE 1 TABLET BY MOUTH DAILY WITH BREAKFAST FOR DIABETES 90 tablet 1  .  glucose blood (FREESTYLE LITE) test strip USE 1 STRIP TO CHECK GLUCOSE ONCE DAILY. Dx: E11.29 100 each 0  . glucose blood test strip Use as instructed.  Check blood glucose three times a day and as needed. 100 each 4  . losartan (COZAAR) 50 MG tablet Take      1 to 2 tablets      Daily      for BP      & patient knows to take BY MOUTH 180 tablet 0  . Magnesium 400 MG CAPS Take 400 mg by mouth daily.    . metFORMIN (GLUCOPHAGE-XR) 500 MG 24 hr tablet Take 1 tablet with Breakfast and Lunch & 2 tablets with Supper for Diabetes 360 tablet 0  . simvastatin (ZOCOR) 40 MG tablet Take 1 tablet at Bedtime for Cholesterol 90 tablet 0   No current facility-administered medications on file prior to visit.    Allergies: Allergies  Allergen Reactions  . Capoten [Captopril]     Fatigue/depression  . Lamisil [Terbinafine] Rash    Current Problems (verified) has Aneurysm of abdominal vessel ([place]); Hyperlipidemia associated with type  2 diabetes mellitus ([place]); Hypertension; Vitamin D deficiency; COPD (chronic obstructive pulmonary disease) ([place]); Medication management; Type II diabetes mellitus with CKD 3 (GFR 58 ml/min)  (HCC); ASCAD s/p PTCA/Stents; Overweight (BMI 25.0-29.9); Encounter for Medicare annual wellness exam; Atherosclerosis of aorta ([place]); PAD (peripheral artery disease) ([place]); Vertigo; CKD stage 3 due to type 2 diabetes mellitus ([place]); Former smoker; and Actinic keratoses on their problem list.  Screening Tests Immunization History  Administered Date(s) Administered  . Influenza Whole 06/16/2012, 03/26/2013  . Influenza, High Dose Seasonal PF 04/19/2014, 04/26/2015, 04/23/2016, 04/17/2017, 04/28/2018, 03/31/2019  . Moderna Sars-Covid-2 Vaccination 09/06/2019, 10/04/2019  . Td 05/26/2003   Preventative care: Last colonoscopy: 2014 per patient, was advised none further  CXR 07/2018 Carotid Doppler neg 2013 Stress test 2002  AB aorta 2014 CT chest 07/2018 - emphysema, aortic  atherosclerosis  Prior vaccinations: TD or Tdap: 2004, declines further Influenza: 03/2019 Pneumococcal: 2000, declines Prevnar 13: declines Shingles/Zostavax: declines Covid 19: 2/2, 2021 at [place], patient will call with information  Names of Other Physician/Practitioners you currently use: 1. [facility] here for primary care [facility]/2019 - report verified and abstracted  3. , dentist, last visit 2018 - full dentures  4. Dr. Allyson Sabal, derm, last visit 2019, overdue, has scheduled this Friday with new Dr.   Patient Care Team: [name], MD as PCP - General (Internal Medicine) Jacolyn Reedy, MD as Consulting Physician (Cardiology) Elam Dutch, MD as Consulting Physician (Vascular Surgery) Laurence Spates, MD (Inactive) as Consulting Physician (Gastroenterology) Druscilla Brownie, MD as Consulting Physician (Dermatology)  Surgical: He  has a past surgical history that includes Abdominal aortic aneurysm repair (10/23/2005); cytoscopy; Shoulder surgery (Right); and Cataract extraction, bilateral (Bilateral, 2021). Family His family history includes Diabetes in his daughter, mother, and sister; Heart attack (age of onset: 54) in his mother; Heart disease in his father and mother. Social history  He reports that he quit smoking about 30 years ago. His smoking use included cigarettes. He has a 42.00 pack-year smoking history. He has never used smokeless tobacco. He reports that he does not drink alcohol and does not use drugs.   Objective:   There were no vitals filed for this visit. There is no height or weight on file to calculate BMI.  General appearance: alert, no distress, WD/WN, male HEENT: normocephalic, sclerae anicteric, TMs pearly, nares patent, no discharge or erythema, pharynx normal Oral cavity: MMM, no lesions. HOH, not wearing his hearing aids.  Neck: supple, no lymphadenopathy, no thyromegaly,  no masses Heart: RRR, normal S1, S2, no murmurs Lungs: CTA bilaterally, no wheezes, rhonchi, or rales Abdomen: +bs, soft, non tender, non distended, no masses, no hepatomegaly, no splenomegaly Musculoskeletal: nontender, no swelling, no obvious deformity Extremities: no edema, no cyanosis, no clubbing Pulses: 2+ symmetric, upper and lower extremities, normal cap refill Neurological: alert, oriented x 3, CN2-12 intact, strength normal upper extremities and lower extremities, sensation normal throughout, DTRs 2+ throughout, no cerebellar signs, gait slow steady Psychiatric: normal affect, behavior normal, pleasant      Bayard Males, DNP  [facility] 06/25/2020  11:41 PM

## 2020-06-26 ENCOUNTER — Ambulatory Visit: Payer: Medicare Other | Admitting: Adult Health Nurse Practitioner

## 2020-06-27 ENCOUNTER — Ambulatory Visit (INDEPENDENT_AMBULATORY_CARE_PROVIDER_SITE_OTHER): Payer: Medicare Other | Admitting: Adult Health Nurse Practitioner

## 2020-06-27 ENCOUNTER — Other Ambulatory Visit: Payer: Self-pay

## 2020-06-27 ENCOUNTER — Encounter: Payer: Self-pay | Admitting: Adult Health Nurse Practitioner

## 2020-06-27 VITALS — BP 134/78 | HR 64 | Ht 72.5 in | Wt 200.0 lb

## 2020-06-27 DIAGNOSIS — E785 Hyperlipidemia, unspecified: Secondary | ICD-10-CM

## 2020-06-27 DIAGNOSIS — E1122 Type 2 diabetes mellitus with diabetic chronic kidney disease: Secondary | ICD-10-CM | POA: Diagnosis not present

## 2020-06-27 DIAGNOSIS — I2510 Atherosclerotic heart disease of native coronary artery without angina pectoris: Secondary | ICD-10-CM | POA: Diagnosis not present

## 2020-06-27 DIAGNOSIS — I714 Abdominal aortic aneurysm, without rupture, unspecified: Secondary | ICD-10-CM

## 2020-06-27 DIAGNOSIS — N183 Chronic kidney disease, stage 3 unspecified: Secondary | ICD-10-CM

## 2020-06-27 DIAGNOSIS — Z79899 Other long term (current) drug therapy: Secondary | ICD-10-CM

## 2020-06-27 DIAGNOSIS — E663 Overweight: Secondary | ICD-10-CM

## 2020-06-27 DIAGNOSIS — R42 Dizziness and giddiness: Secondary | ICD-10-CM

## 2020-06-27 DIAGNOSIS — I700 Atherosclerosis of aorta: Secondary | ICD-10-CM | POA: Diagnosis not present

## 2020-06-27 DIAGNOSIS — I739 Peripheral vascular disease, unspecified: Secondary | ICD-10-CM | POA: Diagnosis not present

## 2020-06-27 DIAGNOSIS — J42 Unspecified chronic bronchitis: Secondary | ICD-10-CM

## 2020-06-27 DIAGNOSIS — E559 Vitamin D deficiency, unspecified: Secondary | ICD-10-CM

## 2020-06-27 DIAGNOSIS — E1169 Type 2 diabetes mellitus with other specified complication: Secondary | ICD-10-CM | POA: Diagnosis not present

## 2020-06-27 DIAGNOSIS — N1831 Chronic kidney disease, stage 3a: Secondary | ICD-10-CM

## 2020-06-27 DIAGNOSIS — I10 Essential (primary) hypertension: Secondary | ICD-10-CM

## 2020-06-27 NOTE — Progress Notes (Signed)
FOLLOW UP 3 MONTH  Assessment / Plan:   Diagnoses and all orders for this visit:  Essential hypertension Labile; take losartan 50 mg tablet dailyMonitor blood pressure at home; call if consistently above goal (140/90) despite increased dose Continue DASH diet.   Reminder to go to the ER if any CP, SOB, nausea, dizziness, severe HA, changes vision/speech, left arm numbness and tingling and jaw pain.  Atherosclerosis of aorta ([place]) Per CT 07/2018 Control blood pressure, cholesterol, glucose, increase exercise.   PAD (peripheral artery disease) (HCC) Control blood pressure, cholesterol, glucose, increase exercise.   ASCAD s/p PTCA/Stents Control blood pressure, cholesterol, glucose, increase exercise.  Followed by cardiology  Aneurysm of abdominal vessel ([place]) S/p repair; control BP; followed by [facility]; stable on follow up imaging and was released by vascular -   Chronic bronchitis, unspecified chronic bronchitis type ([place]) Per imaging; Has stopped smoking, avoid triggers, no symptoms at this time.   Type 2 diabetes mellitus with stage 3 chronic kidney disease, without long-term current use of insulin (HCC) Education: Reviewed ABCs of diabetes management (respective goals in parentheses):  A1C (<7), blood pressure (<130/80), and cholesterol (LDL <70) Continue metformin 500 mg, likely reduce to 2 tabs/day, titrate glimepiride if needed Eye Exam yearly and Dental Exam every 6 months - UTD Dietary recommendations Physical Activity recommendations  CKD stage 3 due to type 2 diabetes mellitus ([place]) If GFR trending down further will reduce metformin  Increase fluids, avoid NSAIDS, monitor sugars, will monitor Check CMP/GFR  Vitamin D deficiency At goal at recent check; continue to recommend supplementation for goal of 70-100 Defer vitamin D level  Vertigo Hasn't had symptoms recently; has meclizine if needed, has not used.  Medication management CBC, CMP/GFR,  magnesium  Mixed hyperlipidemia associated with T2DM (HCC) Continue statin for LDL goal <70 Continue low cholesterol diet and exercise.  Check lipid panel.   CKD stage 3 due to DMII (HCC) Continue glucose monitoring Continue medications Increase fluids Avoid NSAIDS Discussed dietary modifications and exercise Will continue to monitor   BMI 26 Long discussion about weight loss, diet, and exercise Recommended diet heavy in fruits and veggies and low in animal meats, cheeses, and dairy products, appropriate calorie intake Discussed appropriate weight for height  Follow up at next visit   Further disposition pending results if labs check today. Discussed med's effects and SE's.   Over 30 minutes of face to face interview, exam, counseling, chart review, and critical decision making was performed.    Future Appointments  Date Time Provider [place]  10/09/2020  9:00 AM Liane Comber, NP [facility] None  01/15/2021 11:00 AM Garnet Sierras, NP [facility] None     HPI:  Isaiah Jackson is a 86 y.o. male who presents for 3 month follow up for HTN, HLD, T2DM, and vitamin D Def.   He reports no health or medication concerns today.  He is very hard of hearing, not wearing hearing aids today.  In 1996 he had PTCA/stents and in 2006 he had a negative Cardiolite.  He has hx of AAA s/p stent graft by Dr. Oneida Alar which was stable for several years monitored by [facility] and has since been released. He is a former smoker, 42 pack year history, quit in 1991. He had CT of chest in 07/2018 which showed aortic atherosclerosis and emphysema without concerning nodules.   BMI is Body mass index is 26.75 kg/m., he has not been working on diet and exercise, but reports very active 3+  hours a day working on his property and caring for animals.  Wt Readings from Last 3 Encounters:  06/27/20 200 lb (90.7 kg)  03/22/20 199 lb (90.3 kg)  12/08/19 200 lb (90.7 kg)   His blood  pressure has reportedly been labile, average 952W-413K/44W, systolic has been as low as 90 and high as 180 in the last 2 weeks per patient, today their BP is BP: 134/78, similar by manual recheck.  He does not workout. He denies chest pain, shortness of breath, dizziness.   He is on cholesterol medication (simvastatin 40 mg every other day) and denies myalgias. His LDL cholesterol is at goal. The cholesterol last visit was:   Lab Results  Component Value Date   CHOL 126 03/22/2020   HDL 42 03/22/2020   LDLCALC 65 03/22/2020   TRIG 108 03/22/2020   CHOLHDL 3.0 03/22/2020   He has been working on diet and exercise for T2DM (on metformin 500 mg x 2, glimepiride 4 mg daily in AM), and denies foot ulcerations, hypoglycemia , increased appetite, nausea, paresthesia of the feet, polydipsia, polyuria, visual disturbances, vomiting and weight loss.  He does check occasional fasting blood sugars, reports running between 115-140s. Rare up to 150. Last A1C in the office was:  Lab Results  Component Value Date   HGBA1C 8.2 (H) 03/22/2020   He has CKD III associated with T2DM monitored at this office:  Lab Results  Component Value Date   GFRNONAA 34 (L) 03/22/2020  Estimates 2.5 bottles of water daily.    Patient is on Vitamin D supplement and near goal at last check:    Lab Results  Component Value Date   VD25OH 90 03/22/2020      Medication Review: Current Outpatient Medications on File Prior to Visit  Medication Sig Dispense Refill   aspirin 81 MG tablet Take 81 mg by mouth daily.     Blood Glucose Monitoring Suppl DEVI Please provide glucometer per insurance coverage.  Insurance no longer covers supplies for current Freestyle Freedom lite.  Check blood glucose three times a day and as needed. 1 each 0   Cholecalciferol (VITAMIN D-3) 1000 UNITS CAPS Take 5,000 Units by mouth daily.     fenofibrate micronized (LOFIBRA) 134 MG capsule Take 1 capsule Daily for Triglycerides (Blood Fats) 90  capsule 3   glimepiride (AMARYL) 4 MG tablet TAKE 1 TABLET BY MOUTH DAILY WITH BREAKFAST FOR DIABETES 90 tablet 1   glucose blood (FREESTYLE LITE) test strip USE 1 STRIP TO CHECK GLUCOSE ONCE DAILY. Dx: E11.29 100 each 0   glucose blood test strip Use as instructed.  Check blood glucose three times a day and as needed. 100 each 4   losartan (COZAAR) 50 MG tablet Take      1 to 2 tablets      Daily      for BP      & patient knows to take BY MOUTH 180 tablet 0   Magnesium 400 MG CAPS Take 400 mg by mouth daily.     metFORMIN (GLUCOPHAGE-XR) 500 MG 24 hr tablet Take 1 tablet with Breakfast and Lunch & 2 tablets with Supper for Diabetes 360 tablet 0   simvastatin (ZOCOR) 40 MG tablet Take 1 tablet at Bedtime for Cholesterol 90 tablet 0   No current facility-administered medications on file prior to visit.    Allergies: Allergies  Allergen Reactions   Capoten [Captopril]     Fatigue/depression   Lamisil [Terbinafine] Rash  Current Problems (verified) has Aneurysm of abdominal vessel ([place]); Hyperlipidemia associated with type 2 diabetes mellitus ([place]); Hypertension; Vitamin D deficiency; COPD (chronic obstructive pulmonary disease) ([place]); Medication management; Type II diabetes mellitus with CKD 3 (GFR 58 ml/min)  (HCC); ASCAD s/p PTCA/Stents; Overweight (BMI 25.0-29.9); Encounter for Medicare annual wellness exam; Atherosclerosis of aorta ([place]); PAD (peripheral artery disease) ([place]); Vertigo; CKD stage 3 due to type 2 diabetes mellitus ([place]); Former smoker; and Actinic keratoses on their problem list.  Screening Tests Immunization History  Administered Date(s) Administered   Influenza Whole 06/16/2012, 03/26/2013   Influenza, High Dose Seasonal PF 04/19/2014, 04/26/2015, 04/23/2016, 04/17/2017, 04/28/2018, 03/31/2019   Moderna Sars-Covid-2 Vaccination 09/06/2019, 10/04/2019   Td 05/26/2003   Preventative care: Last colonoscopy: 2014 per patient, was advised none further   CXR 07/2018 Carotid Doppler neg 2013 Stress test 2002  AB aorta 2014 CT chest 07/2018 - emphysema, aortic atherosclerosis  Prior vaccinations: TD or Tdap: 2004, declines further Influenza: 03/2019 Pneumococcal: 2000, declines Prevnar 13: declines Shingles/Zostavax: declines Covid 19: 2/2, 2021 at [place], patient will call with information, Booster 12/21  Names of Other Physician/Practitioners you currently use: 1. [facility] here for primary care [facility]. [facility], last visit [facility]/2020 - report verified and abstracted  3. , dentist, last visit 2018 - full dentures  4. Dr. Allyson Sabal, derm, last visit 2019, overdue, Stopped going, declines further. Patient Care Team: [name], MD as PCP - General (Internal Medicine) Jacolyn Reedy, MD as Consulting Physician (Cardiology) Elam Dutch, MD as Consulting Physician (Vascular Surgery) Laurence Spates, MD (Inactive) as Consulting Physician (Gastroenterology) Druscilla Brownie, MD as Consulting Physician (Dermatology)  Surgical: He  has a past surgical history that includes Abdominal aortic aneurysm repair (10/23/2005); cytoscopy; Shoulder surgery (Right); and Cataract extraction, bilateral (Bilateral, 2021). Family His family history includes Diabetes in his daughter, mother, and sister; Heart attack (age of onset: 27) in his mother; Heart disease in his father and mother. Social history  He reports that he quit smoking about 30 years ago. His smoking use included cigarettes. He has a 42.00 pack-year smoking history. He has never used smokeless tobacco. He reports that he does not drink alcohol and does not use drugs.   Objective:   Today's Vitals   06/27/20 1043  BP: 134/78  Pulse: 64  SpO2: 97%  Weight: 200 lb (90.7 kg)  Height: 6' 0.5" (1.842 m)   Body mass index is 26.75 kg/m.  General appearance: alert, no distress, WD/WN, male HEENT: normocephalic, sclerae anicteric, TMs  pearly, nares patent, no discharge or erythema, pharynx normal Oral cavity: MMM, no lesions. HOH, not wearing his hearing aids.  Neck: supple, no lymphadenopathy, no thyromegaly, no masses Heart: RRR, normal S1, S2, no murmurs Lungs: CTA bilaterally, no wheezes, rhonchi, or rales Abdomen: +bs, soft, non tender, non distended, no masses, no hepatomegaly, no splenomegaly Musculoskeletal: nontender, no swelling, no obvious deformity Extremities: no edema, no cyanosis, no clubbing Pulses: 2+ symmetric, upper and lower extremities, normal cap refill Neurological: alert, oriented x 3, CN2-12 intact, strength normal upper extremities and lower extremities, sensation normal throughout, DTRs 2+ throughout, no cerebellar signs, gait slow steady Psychiatric: normal affect, behavior normal, pleasant      Garnet Sierras, Laqueta Jean, DNP [facility] 06/27/2020  11:01 AM

## 2020-06-27 NOTE — Patient Instructions (Signed)

## 2020-06-28 LAB — CBC WITH DIFFERENTIAL/PLATELET
Absolute Monocytes: 623 cells/uL (ref 200–950)
Basophils Absolute: 53 cells/uL (ref 0–200)
Basophils Relative: 0.7 %
Eosinophils Absolute: 106 cells/uL (ref 15–500)
Eosinophils Relative: 1.4 %
HCT: 55.8 % — ABNORMAL HIGH (ref 38.5–50.0)
Hemoglobin: 17.2 g/dL — ABNORMAL HIGH (ref 13.2–17.1)
Lymphs Abs: 2462 cells/uL (ref 850–3900)
MCH: 23.7 pg — ABNORMAL LOW (ref 27.0–33.0)
MCHC: 30.8 g/dL — ABNORMAL LOW (ref 32.0–36.0)
MCV: 77 fL — ABNORMAL LOW (ref 80.0–100.0)
MPV: 10.9 fL (ref 7.5–12.5)
Monocytes Relative: 8.2 %
Neutro Abs: 4355 cells/uL (ref 1500–7800)
Neutrophils Relative %: 57.3 %
Platelets: 267 10*3/uL (ref 140–400)
RBC: 7.25 10*6/uL — ABNORMAL HIGH (ref 4.20–5.80)
RDW: 17.3 % — ABNORMAL HIGH (ref 11.0–15.0)
Total Lymphocyte: 32.4 %
WBC: 7.6 10*3/uL (ref 3.8–10.8)

## 2020-06-28 LAB — COMPLETE METABOLIC PANEL WITH GFR
AG Ratio: 1.4 (calc) (ref 1.0–2.5)
ALT: 14 U/L (ref 9–46)
AST: 17 U/L (ref 10–35)
Albumin: 4 g/dL (ref 3.6–5.1)
Alkaline phosphatase (APISO): 67 U/L (ref 35–144)
BUN/Creatinine Ratio: 11 (calc) (ref 6–22)
BUN: 23 mg/dL (ref 7–25)
CO2: 29 mmol/L (ref 20–32)
Calcium: 11.2 mg/dL — ABNORMAL HIGH (ref 8.6–10.3)
Chloride: 96 mmol/L — ABNORMAL LOW (ref 98–110)
Creat: 2.07 mg/dL — ABNORMAL HIGH (ref 0.70–1.11)
GFR, Est African American: 33 mL/min/{1.73_m2} — ABNORMAL LOW (ref >=60)
GFR, Est Non African American: 29 mL/min/{1.73_m2} — ABNORMAL LOW (ref >=60)
Globulin: 2.8 g/dL (calc) (ref 1.9–3.7)
Glucose, Bld: 178 mg/dL — ABNORMAL HIGH (ref 65–99)
Potassium: 5 mmol/L (ref 3.5–5.3)
Sodium: 133 mmol/L — ABNORMAL LOW (ref 135–146)
Total Bilirubin: 0.7 mg/dL (ref 0.2–1.2)
Total Protein: 6.8 g/dL (ref 6.1–8.1)

## 2020-06-28 LAB — LIPID PANEL
Cholesterol: 123 mg/dL (ref <200)
HDL: 38 mg/dL — ABNORMAL LOW (ref >=40)
LDL Cholesterol (Calc): 60 mg/dL (calc)
Non-HDL Cholesterol (Calc): 85 mg/dL (calc) (ref <130)
Total CHOL/HDL Ratio: 3.2 (calc) (ref <5.0)
Triglycerides: 174 mg/dL — ABNORMAL HIGH (ref <150)

## 2020-06-28 LAB — HEMOGLOBIN A1C
Hgb A1c MFr Bld: 7.8 % of total Hgb — ABNORMAL HIGH (ref <5.7)
Mean Plasma Glucose: 177 mg/dL
eAG (mmol/L): 9.8 mmol/L

## 2020-07-05 ENCOUNTER — Other Ambulatory Visit: Payer: Self-pay | Admitting: Internal Medicine

## 2020-08-25 ENCOUNTER — Encounter: Payer: Medicare Other | Admitting: Adult Health

## 2020-08-31 DIAGNOSIS — H43811 Vitreous degeneration, right eye: Secondary | ICD-10-CM | POA: Diagnosis not present

## 2020-08-31 DIAGNOSIS — E119 Type 2 diabetes mellitus without complications: Secondary | ICD-10-CM | POA: Diagnosis not present

## 2020-08-31 DIAGNOSIS — H538 Other visual disturbances: Secondary | ICD-10-CM | POA: Diagnosis not present

## 2020-08-31 DIAGNOSIS — Z961 Presence of intraocular lens: Secondary | ICD-10-CM | POA: Diagnosis not present

## 2020-08-31 LAB — HM DIABETES EYE EXAM

## 2020-09-19 ENCOUNTER — Encounter: Payer: Self-pay | Admitting: *Deleted

## 2020-09-27 ENCOUNTER — Other Ambulatory Visit: Payer: Self-pay | Admitting: Ophthalmology

## 2020-09-27 DIAGNOSIS — E113393 Type 2 diabetes mellitus with moderate nonproliferative diabetic retinopathy without macular edema, bilateral: Secondary | ICD-10-CM | POA: Diagnosis not present

## 2020-09-27 DIAGNOSIS — H43813 Vitreous degeneration, bilateral: Secondary | ICD-10-CM | POA: Diagnosis not present

## 2020-09-27 DIAGNOSIS — H26491 Other secondary cataract, right eye: Secondary | ICD-10-CM | POA: Diagnosis not present

## 2020-09-27 DIAGNOSIS — H532 Diplopia: Secondary | ICD-10-CM

## 2020-10-06 NOTE — Progress Notes (Signed)
CPE AND FOLLOW UP Assessment:   Diagnoses and all orders for this visit:  Encounter for Annual wellness exam Declines other vaccines Otherwise UTD  PAD (peripheral artery disease) ([place]) Control blood pressure, cholesterol, glucose, increase exercise.   Essential hypertension Severely elevated today; take losartan 100 mg at home, sent in amlodipine  5mg  to take if PM check 140/90+, keep close log and follow up 1 week  Monitor blood pressure at home; call if consistently above goal (140/90) despite increased dose Continue DASH diet.   Reminder to go to the ER if any CP, SOB, nausea, dizziness, severe HA, changes vision/speech, left arm numbness and tingling and jaw pain.  Atherosclerosis of aorta ([place]) Per CT 07/2018 Control blood pressure, cholesterol, glucose, increase exercise.   ASCAD s/p PTCA/Stents Control blood pressure, cholesterol, glucose, increase exercise.  Followed by cardiology  Aneurysm of abdominal vessel ([place]) S/p repair; control BP; followed by [facility]; stable on follow up imaging and was released by vascular -   Chronic bronchitis, unspecified chronic bronchitis type ([place]) Per imaging; Has stopped smoking, avoid triggers, no symptoms at this time.   Type 2 diabetes mellitus with stage 3 chronic kidney disease, without long-term current use of insulin [facility]) Education: Reviewed 'ABCs' of diabetes management (respective goals in parentheses):  A1C (<7), blood pressure (<130/80), and cholesterol (LDL <70) Pending GFR may reduce/discontinue, titrate glimepiride if needed  Eye Exam yearly and Dental Exam every 6 months - UTD - reports requested Dr. Celine Ahr Dietary recommendations Physical Activity recommendations  CKD stage 3 due to type 2 diabetes mellitus (HCC) GFR down - reduced metformin to 500 mg BID, may need to reduce further or stop if renal functions not improved Increase fluids, avoid NSAIDS, monitor sugars, will monitor  Diabetic neuropathy  (HCC) Decreased sensation, declines medications; check feet daily   Vitamin D deficiency At goal at recent check; continue to recommend supplementation for goal of 70-100 Check vitamin D level  Vertigo Hasn't had symptoms recently; has meclizine if needed   Medication management CBC, CMP/GFR, magnesium  Mixed hyperlipidemia associated with T2DM (HCC) Continue statin for LDL goal <70 Continue low cholesterol diet and exercise.  Check lipid panel.   BMI 26 Long discussion about weight loss, diet, and exercise Recommended diet heavy in fruits and veggies and low in animal meats, cheeses, and dairy products, appropriate calorie intake Discussed appropriate weight for height  Follow up at next visit    Orders Placed This Encounter  Procedures  . CBC with Differential/Platelet  . COMPLETE METABOLIC PANEL WITH GFR  . Magnesium  . Lipid panel  . TSH  . Hemoglobin A1c  . VITAMIN D 25 Hydroxy (Vit-D Deficiency, Fractures)  . PSA  . Microalbumin / creatinine urine ratio  . Urinalysis, Routine w reflex microscopic  . EKG 12-Lead  . HM DIABETES FOOT EXAM     Over 30 minutes of exam, counseling, chart review, and critical decision making was performed  Future Appointments  Date Time Provider [place]  10/14/2020  2:10 PM GI-315 MR 2 GI-315MRI GI-315 W. WE  10/14/2020  2:50 PM GI-315 MR 2 GI-315MRI GI-315 W. WE  10/14/2020  3:30 PM GI-315 MR 2 GI-315MRI GI-315 W. WE  01/15/2021 11:00 AM Garnet Sierras, NP [facility] None  10/09/2021  9:00 AM Liane Comber, NP [facility] None    Plan:   During the course of the visit the patient was educated and counseled about appropriate screening and preventive services including:    Pneumococcal vaccine  Influenza vaccine  Prevnar 13  Td vaccine  Screening electrocardiogram  Colorectal cancer screening  Diabetes screening  Glaucoma screening  Nutrition counseling    Subjective:  Isaiah Jackson is a 87 y.o. male  who presents for CPE and 3 month follow up for HTN, hyperlipidemia, T2DM, and vitamin D Def. He has Aneurysm of abdominal vessel ([place]); Hyperlipidemia associated with type 2 diabetes mellitus ([place]); Hypertension; Vitamin D deficiency; COPD (chronic obstructive pulmonary disease) ([place]); Medication management; Type II diabetes mellitus with renal manifestation  ([place]); ASCAD s/p PTCA/Stents; Overweight (BMI 25.0-29.9); Atherosclerosis of aorta (HCC); PAD (peripheral artery disease) ([place]); CKD stage 3 due to type 2 diabetes mellitus ([place]); Former smoker (65 pack/year history, quit 1991); and Actinic keratoses on their problem list.   He is widowed, he lives by himself, has 1 acre he takes care of. He has 2 daughters but live out of town, 2 grandchildren, 2 great grandchildren. He is involved with church.   He has had some vision problems; had bil cataracts in 2021, started having floaters/blurry vision in R eye, now seeing Dr. Celine Ahr, reports with some "bleeding behind my eye", wearing a patch, planning MRI this week.   He has had hearing aids, currently "in the shop" and will get back this week. Very HOH without, but reports typically works well.   BMI is Body mass index is 25.79 kg/m., he has not been working on diet and exercise, but reports very active 3+ hours a day working on his property and caring for animals.  Wt Readings from Last 3 Encounters:  10/09/20 192 lb 12.8 oz (87.5 kg)  06/27/20 200 lb (90.7 kg)  03/22/20 199 lb (90.3 kg)   In 1996 he had PTCA/stents and in 2006 he had a negative Cardiolite. He has hx of AAA s/p stent graft by Dr. Oneida Alar which was stable for several years monitored by [facility] and has since been released. He is a former smoker, 42 pack year history, quit in 1991. He had CT of chest in 07/2018 which showed aortic atherosclerosis and emphysema without concerning nodules. Declines CXR today.   His blood pressure has reportedly been labile, average  485I-627O/35K, systolic has been as low as 90 and high as 180 in the last 2 weeks per patient, today their BP is BP: (!) 180/82, similar by manual recheck. Admits didn't take losartan this AM as fasting and typically takes with breakfast.  He does not workout. He denies chest pain, shortness of breath, dizziness.   He is on cholesterol medication (simvastatin 40 mg daily) and denies myalgias. His LDL cholesterol is at goal. The cholesterol last visit was:   Lab Results  Component Value Date   CHOL 123 06/27/2020   HDL 38 (L) 06/27/2020   LDLCALC 60 06/27/2020   TRIG 174 (H) 06/27/2020   CHOLHDL 3.2 06/27/2020   He has been working on diet and exercise for T2DM (on metformin 500 mg x 4 - note CKD -, glimepiride 4 mg daily in AM), and denies foot ulcerations, increased appetite, nausea, paresthesia of the feet, polydipsia, polyuria, vomiting and weight loss.  He does check occasional fasting blood sugars, reports running between 90-145, did have fasting of 60 1 time this week (atypical, denied hypoglycemia sx). Last A1C in the office was:  Lab Results  Component Value Date   HGBA1C 7.8 (H) 06/27/2020   He has CKD III associated with T2DM monitored at this office:  Lab Results  Component Value Date  GFRNONAA 29 (L) 06/27/2020   GFRNONAA 34 (L) 03/22/2020   GFRNONAA 45 (L) 12/08/2019  Estimates 3-4 bottles of water daily.    Patient is on Vitamin D supplement and near goal at last check:    Lab Results  Component Value Date   VD25OH 53 03/22/2020     He does endorse nocturia, 3-4 times a night, up from twice a night in the last month; denies straining, dribbling after urination, sense of imcomplete bladder emptying. He does report sense of urgency, will have some mild dribbling if doesn't use restroom immediately.  Lab Results  Component Value Date   PSA 0.1 09/01/2019   PSA 0.2 07/09/2018   PSA 0.1 01/30/2017      Medication Review: Current Outpatient Medications on File Prior  to Visit  Medication Sig Dispense Refill  . aspirin 81 MG tablet Take 81 mg by mouth daily.    . Blood Glucose Monitoring Suppl DEVI Please provide glucometer per insurance coverage.  Insurance no longer covers supplies for current Freestyle Freedom lite.  Check blood glucose three times a day and as needed. 1 each 0  . Cholecalciferol (VITAMIN D-3) 1000 UNITS CAPS Take 5,000 Units by mouth daily.    [name] glimepiride (AMARYL) 4 MG tablet TAKE 1 TABLET BY MOUTH DAILY WITH BREAKFAST FOR DIABETES 90 tablet 1  . glucose blood (FREESTYLE LITE) test strip USE 1 STRIP TO CHECK GLUCOSE ONCE DAILY. Dx: E11.29 100 each 0  . glucose blood test strip Use as instructed.  Check blood glucose three times a day and as needed. 100 each 4  . losartan (COZAAR) 50 MG tablet Take      1 to 2 tablets      Daily      for BP      & patient knows to take BY MOUTH 180 tablet 0  . Magnesium 400 MG CAPS Take 400 mg by mouth daily.     No current facility-administered medications on file prior to visit.    Allergies: Allergies  Allergen Reactions  . Capoten [Captopril]     Fatigue/depression  . Lamisil [Terbinafine] Rash    Current Problems (verified) has Aneurysm of abdominal vessel ([place]); Hyperlipidemia associated with type 2 diabetes mellitus ([place]); Hypertension; Vitamin D deficiency; COPD (chronic obstructive pulmonary disease) ([place]); Medication management; Type II diabetes mellitus with renal manifestation  ([place]); ASCAD s/p PTCA/Stents; Overweight (BMI 25.0-29.9); Atherosclerosis of aorta (HCC); PAD (peripheral artery disease) ([place]); CKD stage 3 due to type 2 diabetes mellitus ([place]); Former smoker (49 pack/year history, quit 1991); and Actinic keratoses on their problem list.  Screening Tests Immunization History  Administered Date(s) Administered  . Influenza Whole 06/16/2012, 03/26/2013  . Influenza, High Dose Seasonal PF 04/19/2014, 04/26/2015, 04/23/2016, 04/17/2017, 04/28/2018, 03/31/2019  . Moderna  Sars-Covid-2 Vaccination 09/06/2019, 10/04/2019, 06/22/2020  . Td 05/26/2003   Preventative care: Last colonoscopy: 2014 per patient, was advised none further  CXR 07/2018 - declines today, get later this year CT chest 07/2018 - emphysema, aortic atherosclerosis - aged out  Prior vaccinations: TD or Tdap: 2004, declines further Influenza: 03/2019 - declines today  Pneumococcal: 2000, declines Prevnar 13: declines Shingles/Zostavax: declines Covid 19: 3/3, 2021 at [place],   Names of Other Physician/Practitioners you currently use: 1. [facility] here for [facility], [facility]2020 - report verified and abstracted, has also seen Dr. Celine Ahr since, ? retinal bleed, has follow up planned -  3. , dentist, last  visit 2018 - full dentures  4. Dr. [place] Crane, derm, last visit 2021, goes annually  Patient Care Team: [name], MD as PCP - General (Internal Medicine) Jacolyn Reedy, MD as Consulting Physician (Cardiology) Elam Dutch, MD as Consulting Physician (Vascular Surgery) Laurence Spates, MD (Inactive) as Consulting Physician (Gastroenterology)  Surgical: He  has a past surgical history that includes Abdominal aortic aneurysm repair (10/23/2005); cytoscopy; Shoulder surgery (Right); and Cataract extraction, bilateral (Bilateral, 2021). Family His family history includes Diabetes in his daughter, mother, and sister; Heart attack (age of onset: 62) in his mother; Heart disease in his father and mother. Social history  He reports that he quit smoking about 31 years ago. His smoking use included cigarettes. He has a 42.00 pack-year smoking history. He has never used smokeless tobacco. He reports that he does not drink alcohol and does not use drugs.     Review of Systems  Constitutional: Negative for malaise/fatigue and weight loss.  HENT: Negative for hearing loss and tinnitus.   Eyes: Negative for blurred vision (R  eye, seeing ophth) and double vision.  Respiratory: Negative for cough, sputum production, shortness of breath and wheezing.   Cardiovascular: Negative for chest pain, palpitations, orthopnea, claudication, leg swelling and PND.  Gastrointestinal: Negative for abdominal pain, blood in stool, constipation, diarrhea, heartburn, melena, nausea and vomiting.  Genitourinary: Negative.   Musculoskeletal: Negative for falls, joint pain and myalgias.  Skin: Negative for rash.  Neurological: Negative for dizziness, tingling, sensory change, weakness and headaches.  Endo/Heme/Allergies: Negative for polydipsia.  Psychiatric/Behavioral: Negative.  Negative for depression, memory loss, substance abuse and suicidal ideas. The patient is not nervous/anxious and does not have insomnia.   All other systems reviewed and are negative.    Objective:   Today's Vitals   10/09/20 0843  BP: (!) 180/82  Pulse: (!) 58  Temp: (!) 97.3 F (36.3 C)  SpO2: 97%  Weight: 192 lb 12.8 oz (87.5 kg)  Height: 6' 0.5" (1.842 m)   Body mass index is 25.79 kg/m.  General appearance: alert, no distress, WD/WN, male HEENT: PERRLA, normocephalic, sclerae anicteric, TMs pearly, nares patent, no discharge or erythema, pharynx normal Oral cavity: MMM, no lesions. Very HOH, not wearing his hearing aids.  Neck: supple, no lymphadenopathy, no thyromegaly, no masses Heart: RRR, normal S1, S2, no murmurs Lungs: CTA bilaterally, no wheezes, rhonchi, or rales Abdomen: +bs, soft, non tender, non distended, no masses, no hepatomegaly, no splenomegaly Musculoskeletal: nontender, no swelling, no obvious deformity Extremities: no edema, no cyanosis, no clubbing Pulses: 2+ symmetric, upper and lower extremities, normal cap refill Neurological: alert, oriented x 3, CN2-12 intact, strength normal upper extremities and lower extremities, sensation diminished to monofilament bil foot dorsum, DTRs 2+ throughout, no cerebellar signs, gait  slow steady Psychiatric: normal affect, behavior normal, pleasant  GU: defer  EKG: sinus bradycardia, PAC, unchanged T wave inversions, NSCPT (reviewed with Dr. Melford Aase)  The patient's weight, height, BMI, have been recorded in the chart.  I have made referrals, counseling, and provided education to the patient based on review of the above and I have provided the patient with a written personalized care plan for preventive services.     Izora Ribas, NP   10/09/2020

## 2020-10-09 ENCOUNTER — Other Ambulatory Visit: Payer: Self-pay

## 2020-10-09 ENCOUNTER — Ambulatory Visit (INDEPENDENT_AMBULATORY_CARE_PROVIDER_SITE_OTHER): Payer: Medicare Other | Admitting: Adult Health

## 2020-10-09 ENCOUNTER — Encounter: Payer: Self-pay | Admitting: Adult Health

## 2020-10-09 VITALS — BP 180/82 | HR 58 | Temp 97.3°F | Ht 72.5 in | Wt 192.8 lb

## 2020-10-09 DIAGNOSIS — I2510 Atherosclerotic heart disease of native coronary artery without angina pectoris: Secondary | ICD-10-CM

## 2020-10-09 DIAGNOSIS — I700 Atherosclerosis of aorta: Secondary | ICD-10-CM | POA: Diagnosis not present

## 2020-10-09 DIAGNOSIS — E1140 Type 2 diabetes mellitus with diabetic neuropathy, unspecified: Secondary | ICD-10-CM

## 2020-10-09 DIAGNOSIS — N183 Chronic kidney disease, stage 3 unspecified: Secondary | ICD-10-CM

## 2020-10-09 DIAGNOSIS — Z87891 Personal history of nicotine dependence: Secondary | ICD-10-CM

## 2020-10-09 DIAGNOSIS — E559 Vitamin D deficiency, unspecified: Secondary | ICD-10-CM

## 2020-10-09 DIAGNOSIS — E1169 Type 2 diabetes mellitus with other specified complication: Secondary | ICD-10-CM

## 2020-10-09 DIAGNOSIS — E663 Overweight: Secondary | ICD-10-CM

## 2020-10-09 DIAGNOSIS — L570 Actinic keratosis: Secondary | ICD-10-CM

## 2020-10-09 DIAGNOSIS — I714 Abdominal aortic aneurysm, without rupture, unspecified: Secondary | ICD-10-CM

## 2020-10-09 DIAGNOSIS — I10 Essential (primary) hypertension: Secondary | ICD-10-CM

## 2020-10-09 DIAGNOSIS — Z125 Encounter for screening for malignant neoplasm of prostate: Secondary | ICD-10-CM

## 2020-10-09 DIAGNOSIS — E785 Hyperlipidemia, unspecified: Secondary | ICD-10-CM

## 2020-10-09 DIAGNOSIS — E782 Mixed hyperlipidemia: Secondary | ICD-10-CM

## 2020-10-09 DIAGNOSIS — Z0000 Encounter for general adult medical examination without abnormal findings: Secondary | ICD-10-CM

## 2020-10-09 DIAGNOSIS — R42 Dizziness and giddiness: Secondary | ICD-10-CM

## 2020-10-09 DIAGNOSIS — Z79899 Other long term (current) drug therapy: Secondary | ICD-10-CM

## 2020-10-09 DIAGNOSIS — J42 Unspecified chronic bronchitis: Secondary | ICD-10-CM

## 2020-10-09 DIAGNOSIS — Z136 Encounter for screening for cardiovascular disorders: Secondary | ICD-10-CM

## 2020-10-09 DIAGNOSIS — E1122 Type 2 diabetes mellitus with diabetic chronic kidney disease: Secondary | ICD-10-CM

## 2020-10-09 DIAGNOSIS — N1831 Chronic kidney disease, stage 3a: Secondary | ICD-10-CM

## 2020-10-09 DIAGNOSIS — I739 Peripheral vascular disease, unspecified: Secondary | ICD-10-CM

## 2020-10-09 MED ORDER — METFORMIN HCL ER 500 MG PO TB24: ORAL_TABLET | ORAL | 0 refills | Status: DC

## 2020-10-09 MED ORDER — SIMVASTATIN 40 MG PO TABS: ORAL_TABLET | ORAL | 0 refills | Status: AC

## 2020-10-09 MED ORDER — FENOFIBRATE 134 MG PO CAPS: ORAL_CAPSULE | ORAL | 3 refills | Status: AC

## 2020-10-09 MED ORDER — AMLODIPINE BESYLATE 5 MG PO TABS: ORAL_TABLET | ORAL | 0 refills | Status: DC

## 2020-10-09 NOTE — Patient Instructions (Addendum)
Isaiah Jackson , Thank you for taking time to come for your Medicare Wellness Visit. I appreciate your ongoing commitment to your health goals. Please review the following plan we discussed and let me know if I can assist you in the future.   These are the goals we discussed: Goals    . Blood Pressure < 140/90    . DIET - INCREASE WATER INTAKE     Aim for 5-6 bottles of water daily     . Exercise - walk 20 min daily    . fasting glucose <150     Check fasting (morning) sugar daily and write down on a log  Check blood sugar later in the day only if feeling bad    . Weight (lb) < 195 lb (88.5 kg)       This is a list of the screening recommended for you and due dates:  Health Maintenance  Topic Date Due  . COVID-19 Vaccine (4 - Booster for Moderna series) 12/21/2020  . Hemoglobin A1C  12/26/2020  . Flu Shot  02/05/2021  . Eye exam for diabetics  08/31/2021  . Complete foot exam   10/09/2021  . HPV Vaccine  Aged Out  . Tetanus Vaccine  Discontinued  . Pneumonia vaccines  Discontinued    Please call back with information about Dr. Glenford Peers office - please have them forward [place] the notes  Please get derm Dr. Juel Burrow full name for your chart/records  Please start keeping a log of blood pressures, blood sugars and bring with you to [place] appointment   Please start checking blood pressure at night and take new medication amlodipine 5 mg if greater than 140/90  Please continue to take losartan 50 or 100 mg in the morning and don't skip prior to Dr's appointment    Memory Compensation Strategies  1. Use "WARM" strategy.  W= write it down  A= associate it  R= repeat it  M= make a mental note  2.   You can keep a Social worker.  Use a 3-ring notebook with sections for the following: calendar, important names and phone numbers,  medications, doctors' names/phone numbers, lists/reminders, and a section to journal what you did  each day.   3.    Use a calendar to write  appointments down.  4.    Write yourself a schedule for the day.  This can be placed on the calendar or in a separate section of the Memory Notebook.  Keeping a  regular schedule can help memory.  5.    Use medication organizer with sections for each day or morning/evening pills.  You may need help loading it  6.    Keep a basket, or pegboard by the door.  Place items that you need to take out with you in the basket or on the pegboard.  You may also want to  include a message board for reminders.  7.    Use sticky notes.  Place sticky notes with reminders in a place where the task is performed.  For example: " turn off the  stove" placed by the stove, "lock the door" placed on the door at eye level, " take your medications" on  the bathroom mirror or by the place where you normally take your medications.  8.    Use alarms/timers.  Use while cooking to remind yourself to check on food or as a reminder to take your medicine, or as a  reminder to make a  call, or as a reminder to perform another task, etc.

## 2020-10-10 ENCOUNTER — Other Ambulatory Visit: Payer: Self-pay | Admitting: Adult Health

## 2020-10-10 DIAGNOSIS — N184 Chronic kidney disease, stage 4 (severe): Secondary | ICD-10-CM

## 2020-10-10 DIAGNOSIS — E1122 Type 2 diabetes mellitus with diabetic chronic kidney disease: Secondary | ICD-10-CM

## 2020-10-10 DIAGNOSIS — N289 Disorder of kidney and ureter, unspecified: Secondary | ICD-10-CM

## 2020-10-10 LAB — COMPLETE METABOLIC PANEL WITH GFR
AG Ratio: 1.3 (calc) (ref 1.0–2.5)
ALT: 11 U/L (ref 9–46)
AST: 15 U/L (ref 10–35)
Albumin: 4.2 g/dL (ref 3.6–5.1)
Alkaline phosphatase (APISO): 53 U/L (ref 35–144)
BUN/Creatinine Ratio: 10 (calc) (ref 6–22)
BUN: 21 mg/dL (ref 7–25)
CO2: 26 mmol/L (ref 20–32)
Calcium: 10.1 mg/dL (ref 8.6–10.3)
Chloride: 98 mmol/L (ref 98–110)
Creat: 2.03 mg/dL — ABNORMAL HIGH (ref 0.70–1.11)
GFR, Est African American: 34 mL/min/{1.73_m2} — ABNORMAL LOW (ref >=60)
GFR, Est Non African American: 29 mL/min/{1.73_m2} — ABNORMAL LOW (ref >=60)
Globulin: 3.2 g/dL (calc) (ref 1.9–3.7)
Glucose, Bld: 105 mg/dL — ABNORMAL HIGH (ref 65–99)
Potassium: 5.2 mmol/L (ref 3.5–5.3)
Sodium: 135 mmol/L (ref 135–146)
Total Bilirubin: 0.8 mg/dL (ref 0.2–1.2)
Total Protein: 7.4 g/dL (ref 6.1–8.1)

## 2020-10-10 LAB — CBC WITH DIFFERENTIAL/PLATELET
Absolute Monocytes: 651 cells/uL (ref 200–950)
Basophils Absolute: 77 cells/uL (ref 0–200)
Basophils Relative: 1.1 %
Eosinophils Absolute: 77 cells/uL (ref 15–500)
Eosinophils Relative: 1.1 %
HCT: 59.1 % — ABNORMAL HIGH (ref 38.5–50.0)
Hemoglobin: 17.7 g/dL — ABNORMAL HIGH (ref 13.2–17.1)
Lymphs Abs: 2023 cells/uL (ref 850–3900)
MCH: 23.4 pg — ABNORMAL LOW (ref 27.0–33.0)
MCHC: 29.9 g/dL — ABNORMAL LOW (ref 32.0–36.0)
MCV: 78.2 fL — ABNORMAL LOW (ref 80.0–100.0)
MPV: 10.8 fL (ref 7.5–12.5)
Monocytes Relative: 9.3 %
Neutro Abs: 4172 cells/uL (ref 1500–7800)
Neutrophils Relative %: 59.6 %
Platelets: 280 10*3/uL (ref 140–400)
RBC: 7.56 10*6/uL — ABNORMAL HIGH (ref 4.20–5.80)
RDW: 18 % — ABNORMAL HIGH (ref 11.0–15.0)
Total Lymphocyte: 28.9 %
WBC: 7 10*3/uL (ref 3.8–10.8)

## 2020-10-10 LAB — LIPID PANEL
Cholesterol: 139 mg/dL (ref <200)
HDL: 48 mg/dL (ref >=40)
LDL Cholesterol (Calc): 74 mg/dL (calc)
Non-HDL Cholesterol (Calc): 91 mg/dL (calc) (ref <130)
Total CHOL/HDL Ratio: 2.9 (calc) (ref <5.0)
Triglycerides: 88 mg/dL (ref <150)

## 2020-10-10 LAB — URINALYSIS, ROUTINE W REFLEX MICROSCOPIC
Bacteria, UA: NONE SEEN /HPF
Bilirubin Urine: NEGATIVE
Hyaline Cast: NONE SEEN /LPF
Ketones, ur: NEGATIVE
Leukocytes,Ua: NEGATIVE
Nitrite: NEGATIVE
RBC / HPF: NONE SEEN /HPF (ref 0–2)
Specific Gravity, Urine: 1.01 (ref 1.001–1.03)
Squamous Epithelial / HPF: NONE SEEN /HPF (ref <=5)
WBC, UA: NONE SEEN /HPF (ref 0–5)
pH: 6 (ref 5.0–8.0)

## 2020-10-10 LAB — MICROALBUMIN / CREATININE URINE RATIO
Creatinine, Urine: 49 mg/dL (ref 20–320)
Microalb Creat Ratio: 1561 mcg/mg creat — ABNORMAL HIGH (ref <30)
Microalb, Ur: 76.5 mg/dL

## 2020-10-10 LAB — VITAMIN D 25 HYDROXY (VIT D DEFICIENCY, FRACTURES): Vit D, 25-Hydroxy: 61 ng/mL (ref 30–100)

## 2020-10-10 LAB — TSH: TSH: 3.04 mIU/L (ref 0.40–4.50)

## 2020-10-10 LAB — MAGNESIUM: Magnesium: 2.2 mg/dL (ref 1.5–2.5)

## 2020-10-10 LAB — PSA: PSA: 0.18 ng/mL (ref <=4.0)

## 2020-10-10 LAB — HEMOGLOBIN A1C
Hgb A1c MFr Bld: 7.3 % of total Hgb — ABNORMAL HIGH (ref <5.7)
Mean Plasma Glucose: 163 mg/dL
eAG (mmol/L): 9 mmol/L

## 2020-10-10 MED ORDER — GLIMEPIRIDE 4 MG PO TABS: ORAL_TABLET | ORAL | 1 refills | Status: DC

## 2020-10-14 ENCOUNTER — Ambulatory Visit
Admission: RE | Admit: 2020-10-14 | Discharge: 2020-10-14 | Disposition: A | Discharge status: Home / Self Care | Payer: Medicare Other | Source: Ambulatory Visit | Attending: Ophthalmology | Admitting: Ophthalmology

## 2020-10-14 ENCOUNTER — Other Ambulatory Visit: Payer: Self-pay

## 2020-10-14 DIAGNOSIS — R410 Disorientation, unspecified: Secondary | ICD-10-CM | POA: Diagnosis not present

## 2020-10-14 DIAGNOSIS — H532 Diplopia: Secondary | ICD-10-CM | POA: Diagnosis not present

## 2020-10-14 DIAGNOSIS — I639 Cerebral infarction, unspecified: Secondary | ICD-10-CM | POA: Diagnosis not present

## 2020-10-14 DIAGNOSIS — H9190 Unspecified hearing loss, unspecified ear: Secondary | ICD-10-CM | POA: Diagnosis not present

## 2020-10-14 MED ORDER — GADOBENATE DIMEGLUMINE 529 MG/ML IV SOLN
20.0000 mL | Freq: Once | INTRAVENOUS | Status: AC | PRN
  Administered 2020-10-14: 20 mL via INTRAVENOUS

## 2020-10-18 ENCOUNTER — Encounter: Payer: Self-pay | Admitting: Internal Medicine

## 2020-10-18 ENCOUNTER — Other Ambulatory Visit: Payer: Self-pay

## 2020-10-18 ENCOUNTER — Ambulatory Visit (INDEPENDENT_AMBULATORY_CARE_PROVIDER_SITE_OTHER): Payer: Medicare Other | Admitting: Internal Medicine

## 2020-10-18 VITALS — BP 126/84 | HR 62 | Temp 97.5°F | Resp 16 | Ht 72.5 in | Wt 192.6 lb

## 2020-10-18 DIAGNOSIS — I639 Cerebral infarction, unspecified: Secondary | ICD-10-CM | POA: Diagnosis not present

## 2020-10-18 DIAGNOSIS — E611 Iron deficiency: Secondary | ICD-10-CM | POA: Diagnosis not present

## 2020-10-18 DIAGNOSIS — N289 Disorder of kidney and ureter, unspecified: Secondary | ICD-10-CM | POA: Diagnosis not present

## 2020-10-18 DIAGNOSIS — D509 Iron deficiency anemia, unspecified: Secondary | ICD-10-CM

## 2020-10-18 DIAGNOSIS — R0989 Other specified symptoms and signs involving the circulatory and respiratory systems: Secondary | ICD-10-CM | POA: Diagnosis not present

## 2020-10-18 MED ORDER — CLOPIDOGREL BISULFATE 75 MG PO TABS: ORAL_TABLET | ORAL | 3 refills | Status: AC

## 2020-10-18 MED ORDER — OLMESARTAN MEDOXOMIL 40 MG PO TABS: ORAL_TABLET | ORAL | 3 refills | Status: DC

## 2020-10-18 NOTE — Progress Notes (Signed)
Future Appointments  Date Time Provider [place]  10/18/2020  4:00 PM [name], MD [facility] None  01/15/2021 11:00 AM Garnet Sierras, NP [facility] None  10/09/2021  9:00 AM Liane Comber, NP [facility] None      History of Present Illness:        This very nice 87 y.o. WWM with HTN, HLD, T2_DM /CKD3/4 and Vitamin D Deficiency presents for 2 week f/u follow up of elevated BP 180/82 and added Amlodipine 10 mg.       A1c was improved at 7.3% , but Kidney functions had decreased from CKD3b (GFR 34)    to    CKD4 (GFR 29) and Metformin was d/c'd at last OV.  Patient was advised to add glimepiride 4 mg- increasing from 1 tab qam & 1/2 - 1 tab w/supper.  CBC showed Nl Hgb, but was microcytic with MCV 78.2.      Also since last OV had MRI of head & orbit on 10/14/2020 by his Opthalmologist - Dr Satira Sark to evaluate c/o "confusion, diplopia & hearing loss" and the MRI showed a small sub-acute infarct of the Lt corona radiata and ? infarcts of the Left cerebellum & Rt corona radiata.  Today's BP is elevated again at 176/99 and 178/101.    BP Readings from Last 3 Encounters:  10/18/20 126/84  10/09/20 (!) 180/82  06/27/20 134/78     Medications  Current Outpatient Medications (Endocrine & Metabolic):  .  glimepiride (AMARYL) 4 MG tablet, Take 1 tab with breakfast and 1/2-1 tab with supper for sugars/diabetes.   [name]  amLODipine (NORVASC) 5 MG tablet, Take 1 tab in the evening .  fenofibrate micronized (LOFIBRA) 134 MG capsule, Take 1 capsule Daily  .  losartan (COZAAR) 50 MG tablet, Take      1 to 2 tablets      Daily      for BP  .  simvastatin (ZOCOR) 40 MG tablet, Take 1 tablet at Bedtime for Cholesterol   .  aspirin 81 MG tablet, Take 81 mg by mouth daily.  .  Cholecalciferol (VITAMIN D-3) 1000 UNITS CAPS, Take 5,000 Units by mouth daily.  .  Magnesium 400 MG CAPS, Take 400 mg by mouth daily.  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   Observations/Objective:   BP 126/84   Pulse 62   Temp (!) 97.5 F (36.4 C) (Temporal)   Resp 16   Wt 192 lb 9.6 oz (87.4 kg)   SpO2 97%   BMI 25.76 kg/m   Recheck BP      Sitting 176/99   P 58             &           Standing BP 178/101   P  65   HEENT - WNL. Neck - supple.  Chest - Clear equal BS. Cor - Nl HS. RRR w/o sig MGR. PP 1(+). No edema. MS- FROM w/o deformities.  Gait Nl. Neuro -  Nl w/o focal abnormalities.  Assessment and Plan:  1. Labile hypertension  -  Stop Losartan & replace with   - olmesartan  40 MG tablet; Take  1 tablet  at Night  for BP  Disp: 90 tablet; Refill: 3  - ROV 10-14 days to recheck   2. Cerebrovascular accident (CVA)(HCC)  - clopidogrel (PLAVIX) 75 MG tablet; Take  1 tablet  Daily  to Prevent Strokes  Disp: 90 tablet; Refill: 3  3. Microcytic anemia  - CBC with Differential/Platelet - Iron,Total/Total Iron Binding Cap - Ferritin  4. Deterioration in renal function  - COMPLETE METABOLIC PANEL WITH GFR  5. Iron deficiency  - CBC with Differential/Platelet - Iron,Total/Total Iron Binding Cap   Follow Up Instructions:      I discussed the assessment and treatment plan with the patient. The patient was provided an opportunity to ask questions and all were answered. The patient agreed with the plan and demonstrated an understanding of the instructions.            The patient was advised to call back or seek an in-person evaluation if the  symptoms worsen or if the condition fails to improve as anticipated.   Kirtland Bouchard, MD

## 2020-10-18 NOTE — Patient Instructions (Signed)

## 2020-10-19 ENCOUNTER — Telehealth: Payer: Self-pay | Admitting: *Deleted

## 2020-10-19 ENCOUNTER — Other Ambulatory Visit: Payer: Self-pay | Admitting: Internal Medicine

## 2020-10-19 DIAGNOSIS — N184 Chronic kidney disease, stage 4 (severe): Secondary | ICD-10-CM

## 2020-10-19 DIAGNOSIS — E1122 Type 2 diabetes mellitus with diabetic chronic kidney disease: Secondary | ICD-10-CM

## 2020-10-19 LAB — FERRITIN: Ferritin: 34 ng/mL (ref 24–380)

## 2020-10-19 LAB — COMPLETE METABOLIC PANEL WITH GFR
AG Ratio: 1.3 (calc) (ref 1.0–2.5)
ALT: 8 U/L — ABNORMAL LOW (ref 9–46)
AST: 15 U/L (ref 10–35)
Albumin: 3.9 g/dL (ref 3.6–5.1)
Alkaline phosphatase (APISO): 52 U/L (ref 35–144)
BUN/Creatinine Ratio: 12 (calc) (ref 6–22)
BUN: 26 mg/dL — ABNORMAL HIGH (ref 7–25)
CO2: 25 mmol/L (ref 20–32)
Calcium: 10.6 mg/dL — ABNORMAL HIGH (ref 8.6–10.3)
Chloride: 101 mmol/L (ref 98–110)
Creat: 2.12 mg/dL — ABNORMAL HIGH (ref 0.70–1.11)
GFR, Est African American: 32 mL/min/{1.73_m2} — ABNORMAL LOW (ref >=60)
GFR, Est Non African American: 28 mL/min/{1.73_m2} — ABNORMAL LOW (ref >=60)
Globulin: 2.9 g/dL (calc) (ref 1.9–3.7)
Glucose, Bld: 128 mg/dL — ABNORMAL HIGH (ref 65–99)
Potassium: 4.8 mmol/L (ref 3.5–5.3)
Sodium: 136 mmol/L (ref 135–146)
Total Bilirubin: 0.5 mg/dL (ref 0.2–1.2)
Total Protein: 6.8 g/dL (ref 6.1–8.1)

## 2020-10-19 LAB — CBC WITH DIFFERENTIAL/PLATELET
Absolute Monocytes: 733 cells/uL (ref 200–950)
Basophils Absolute: 70 cells/uL (ref 0–200)
Basophils Relative: 0.9 %
Eosinophils Absolute: 172 cells/uL (ref 15–500)
Eosinophils Relative: 2.2 %
HCT: 56.4 % — ABNORMAL HIGH (ref 38.5–50.0)
Hemoglobin: 17.3 g/dL — ABNORMAL HIGH (ref 13.2–17.1)
Lymphs Abs: 2847 cells/uL (ref 850–3900)
MCH: 24.1 pg — ABNORMAL LOW (ref 27.0–33.0)
MCHC: 30.7 g/dL — ABNORMAL LOW (ref 32.0–36.0)
MCV: 78.4 fL — ABNORMAL LOW (ref 80.0–100.0)
MPV: 10.8 fL (ref 7.5–12.5)
Monocytes Relative: 9.4 %
Neutro Abs: 3978 cells/uL (ref 1500–7800)
Neutrophils Relative %: 51 %
Platelets: 265 10*3/uL (ref 140–400)
RBC: 7.19 10*6/uL — ABNORMAL HIGH (ref 4.20–5.80)
RDW: 18 % — ABNORMAL HIGH (ref 11.0–15.0)
Total Lymphocyte: 36.5 %
WBC: 7.8 10*3/uL (ref 3.8–10.8)

## 2020-10-19 LAB — IRON, TOTAL/TOTAL IRON BINDING CAP
%SAT: 5 % (calc) — ABNORMAL LOW (ref 20–48)
Iron: 21 ug/dL — ABNORMAL LOW (ref 50–180)
TIBC: 438 mcg/dL (calc) — ABNORMAL HIGH (ref 250–425)

## 2020-10-19 NOTE — Progress Notes (Signed)
============================================================ ============================================================  -   Iron level is low - but not anemic , probably because kidney functions appear  dehydrated and kidney functions are worsening  so recommend   consultation with a kidney specialist - will request referral consultation.   - Glucose was 128 - OK at time blood drawn  - Stay off Metformin & absolutely                                          avoid NSAID medications like Ibuprofen / Naprosyn -----------------------------------------------------------------------------------------------------------  Please monitor BP's & blood sugar  at bkfst & supper - 2 x /day - and   - suggest f/u OV in 7-10 days to review results.

## 2020-10-19 NOTE — Telephone Encounter (Signed)
Spoke with daughter, Lavella Lemons, regarding medication changes. Dr Melford Aase reviewed his MRI and added Plavix, to prevent strokes, and changed BP medication to Olmesartan, due to high BP.

## 2020-10-19 NOTE — Telephone Encounter (Signed)
Daughter called to confirm patient should continue Amlodipine with the Olmesartan, Also, she asked if he should continue Glimepiride. Patient is to continue both medication and it is OK to take to take 1/2 of the Glimepiride, if blood sugar is below 150. Dr Melford Aase is aware.

## 2020-10-27 ENCOUNTER — Telehealth: Payer: Self-pay | Admitting: *Deleted

## 2020-10-27 NOTE — Telephone Encounter (Signed)
Patient's daughter, Juliann Pulse, called regarding patient's BP and medication. Blood pressures running in the 116/80 range in the mornings and 230/90 range in the morning. Per Dr Melford Aase, adjust his medications to Benicar 40 mg 1/2 tablet t breakfast and supper and take Amlodipine 5 mg at lunch. Juliann Pulse is aware. Juliann Pulse informed Dr Melford Aase that the patient fell in the parking lot at [facility] and hit head and hip, since starting Plavix, but appears to be OK.

## 2020-10-31 NOTE — Patient Instructions (Signed)

## 2020-10-31 NOTE — Progress Notes (Signed)
Future Appointments  Date Time Provider [place]  11/01/2020  4:30 PM [name], MD [facility] None  01/15/2021 11:00 AM Garnet Sierras, NP [facility] None  10/09/2021  9:00 AM Liane Comber, NP [facility] None     History of Present Illness:      This very nice 87 y.o.WWMwith HTN, HLD, T2_DM /CKD3/4 and Vitamin D Deficiency  Returns for close f/u. Recently his BP's have been very labile & occasionally very elevated in the range up to 230/90 and his BP meds have been adjusted.  Two weeks ago Renal functions showed a drop with CKD4 (GFR 28) and patient has been referred to Dr Marval Regal - Nephrology.        Currently taking Benicar 40 mg x 1/2 = 20 mg bid, Amlodipine 5 mg at [facility].        MRI of the head on 10/14/2020 showed a small sub-acute infarct of the Lt corona radiata and ? infarcts of the Left cerebellum & Rt corona radiata and patient was added Plavix .  Patient is wearing a Rt eye patch for Diplopia especially with Rt lateral vision.   Medications  .  glimepiride (AMARYL) 4 MG tablet, Take 1 tab with breakfast and 1/2-1 tab with supper for sugars/diabetes.  [name]  amLODipine (NORVASC) 5 MG tablet, Take 1 tab in the evening . [name]  olmesartan (BENICAR) 40 MG tablet, Take  1 tablet  at Night  for BP  .  fenofibrate micronized (LOFIBRA) 134 MG capsule, Take 1 capsule Daily for Triglycerides (Blood Fats) .  simvastatin (ZOCOR) 40 MG tablet, Take 1 tablet at Bedtime for Cholesterol  .  aspirin 81 MG tablet, Take 81 mg by mouth daily. .  clopidogrel (PLAVIX) 75 MG tablet, Take  1 tablet  Daily  to Prevent Strokes  .  Cholecalciferol (VITAMIN D-3) 1000 UNITS CAPS, Take 5,000 Units by mouth daily. .  Magnesium 400 MG CAPS, Take 400 mg by mouth daily.  Problem list He has Aneurysm of abdominal vessel (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   Observations/Objective:  There were no vitals taken for this visit.  HEENT - WNL. Neck - supple.  Chest - Clear equal BS. Cor - Nl HS. RRR w/o sig MGR. PP 1(+). No edema. MS- FROM w/o deformities.  Gait Nl. Neuro -  Nl w/o focal abnormalities.  Assessment and Plan:  1. Labile hypertension  - olmesartan (BENICAR) 40 MG tablet; Take  1/2 tablet (20 mg)   2 x /day - Am & Pm for BP   Dispense: 90 tablet; Refill: 3  - amLODipine (NORVASC) 5 MG tablet; Take 1/2  Tablet 2 x /day - Am & Pm for BP   Dispense: 90 tablet; Refill: 3  -  NEW  -  bisoprolol-hctz (ZIAC) 5-6.25 MG tablet; Take  1 tablet  every Morning  for BP   Dispense: 90 tablet; Refill: 3  2. CKD stage 4 due to type 2 diabetes mellitus ([place])   3. Cerebrovascular accident (CVA), unspecified mechanism ([place]   Follow Up Instructions:   Recommended 3 week f/u to recheck  I discussed the assessment and treatment plan with the patient & daughter. The patient was provided an opportunity to ask questions and all were answered. The patient agreed with the plan and demonstrated an understanding of the instructions.      The patient was advised to call back or seek an in-person evaluation if the symptoms worsen or if the condition fails to improve as anticipated.   Kirtland Bouchard, MD

## 2020-11-01 ENCOUNTER — Encounter: Payer: Self-pay | Admitting: Internal Medicine

## 2020-11-01 ENCOUNTER — Ambulatory Visit (INDEPENDENT_AMBULATORY_CARE_PROVIDER_SITE_OTHER): Payer: Medicare Other | Admitting: Internal Medicine

## 2020-11-01 ENCOUNTER — Other Ambulatory Visit: Payer: Self-pay

## 2020-11-01 VITALS — BP 128/82 | HR 48 | Temp 97.5°F | Resp 16 | Ht 72.5 in | Wt 190.6 lb

## 2020-11-01 DIAGNOSIS — N184 Chronic kidney disease, stage 4 (severe): Secondary | ICD-10-CM | POA: Diagnosis not present

## 2020-11-01 DIAGNOSIS — I639 Cerebral infarction, unspecified: Secondary | ICD-10-CM

## 2020-11-01 DIAGNOSIS — R0989 Other specified symptoms and signs involving the circulatory and respiratory systems: Secondary | ICD-10-CM

## 2020-11-01 DIAGNOSIS — E1122 Type 2 diabetes mellitus with diabetic chronic kidney disease: Secondary | ICD-10-CM

## 2020-11-01 MED ORDER — BISOPROLOL-HYDROCHLOROTHIAZIDE 5-6.25 MG PO TABS: ORAL_TABLET | ORAL | 3 refills | Status: DC

## 2020-11-01 MED ORDER — AMLODIPINE BESYLATE 5 MG PO TABS: ORAL_TABLET | ORAL | 3 refills | Status: DC

## 2020-11-01 MED ORDER — OLMESARTAN MEDOXOMIL 40 MG PO TABS
ORAL_TABLET | ORAL | 3 refills | Status: AC
Start: 2020-11-01 — End: ?

## 2020-11-21 ENCOUNTER — Encounter: Payer: Self-pay | Admitting: Internal Medicine

## 2020-11-21 NOTE — Progress Notes (Signed)
Future Appointments  Date Time Provider [place]  11/22/2020 11:30 AM [name], MD [facility] None  01/17/2021 - Wellness 11:00 AM Liane Comber, NP [facility] None  10/09/2021   - CPE  9:00 AM Liane Comber, NP [facility] None    History of Present Illness:     This very nice 87 y.o.[place]  labile HTN / CKD3/4, HLD,T2_DM /CKD3/4and Vitamin D Deficiency.  Head MRI showed a small CVA & patient was started on Plavix with his bASA.    Patient reports home BP's are ranging 120-140 / 68-79 and his glucoses ranging betw 90-130 mg%.                                                Medications  .  glimepiride 4 MG tablet, Take 1 tab with breakfast and 1/2-1 tab with supper for sugars/diabetes.  [name]  amLODipine (NORVASC) 5 MG tablet, Take 1/2  Tablet 2 x /day - Am & Pm for BP .  bisoprolol-hydrochlorothiazide (ZIAC) 5-6.25 MG tablet, Take  1 tablet  every Morning  for BP .  olmesartan (BENICAR) 40 MG tablet, Take  1/2 tablet (20 mg)   2 x /day - Am & Pm for BP  .  simvastatin (ZOCOR) 40 MG tablet, Take 1 tablet at Bedtime for Cholesterol .  fenofibrate micronized (LOFIBRA) 134 MG capsule, Take 1 capsule Daily for Triglycerides (Blood Fats)   .  aspirin 81 MG tablet, Take 81 mg by mouth daily. .  clopidogrel (PLAVIX) 75 MG tablet, Take  1 tablet  Daily  to Prevent Strokes  .  Cholecalciferol (VITAMIN D-3) 1000 UNITS CAPS, Take 5,000 Units by mouth daily. .  Magnesium 400 MG CAPS, Take 400 mg by mouth daily.  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   Observations/Objective:  BP (!) 146/60   Pulse (!) 46   Temp 97.7 F (36.5 C)   Resp 16   Ht 6' 0.5" (1.842 m)   Wt 190 lb 12.8 oz (86.5 kg)   SpO2 99%   BMI 25.52 kg/m   HEENT - WNL. Neck - supple.  Chest - Clear equal BS. Cor - Nl HS. RRR w/o sig MGR. PP 1(+). No edema. MS- FROM w/o deformities.  Gait Nl. Neuro -  Nl w/o focal abnormalities.   Assessment and Plan:  1. Labile hypertension  - COMPLETE METABOLIC PANEL WITH GFR  2. Cerebrovascular accident (CVA), unspecified mechanism ([place])  - COMPLETE METABOLIC PANEL WITH GFR  3. CKD stage 4 due to type 2 diabetes mellitus (HCC)  - COMPLETE METABOLIC PANEL WITH GFR  4. Medication management  - COMPLETE METABOLIC PANEL WITH GFR   Follow Up Instructions:        I discussed the assessment and treatment plan with the patient. The patient was provided an opportunity to ask questions and all were answered. The patient agreed with the plan and demonstrated an understanding of the instructions.  The patient was advised to call back or seek an in-person evaluation if the symptoms worsen or if the condition fails to improve as anticipated.    Kirtland Bouchard, MD

## 2020-11-21 NOTE — Patient Instructions (Signed)

## 2020-11-22 ENCOUNTER — Ambulatory Visit (INDEPENDENT_AMBULATORY_CARE_PROVIDER_SITE_OTHER): Payer: Medicare Other | Admitting: Internal Medicine

## 2020-11-22 ENCOUNTER — Other Ambulatory Visit: Payer: Self-pay

## 2020-11-22 VITALS — BP 146/60 | HR 46 | Temp 97.7°F | Resp 16 | Ht 72.5 in | Wt 190.8 lb

## 2020-11-22 DIAGNOSIS — N184 Chronic kidney disease, stage 4 (severe): Secondary | ICD-10-CM

## 2020-11-22 DIAGNOSIS — Z79899 Other long term (current) drug therapy: Secondary | ICD-10-CM

## 2020-11-22 DIAGNOSIS — I639 Cerebral infarction, unspecified: Secondary | ICD-10-CM | POA: Diagnosis not present

## 2020-11-22 DIAGNOSIS — E1122 Type 2 diabetes mellitus with diabetic chronic kidney disease: Secondary | ICD-10-CM

## 2020-11-22 DIAGNOSIS — R0989 Other specified symptoms and signs involving the circulatory and respiratory systems: Secondary | ICD-10-CM

## 2020-11-23 ENCOUNTER — Encounter: Payer: Self-pay | Admitting: *Deleted

## 2020-11-23 ENCOUNTER — Other Ambulatory Visit: Payer: Self-pay | Admitting: Internal Medicine

## 2020-11-23 DIAGNOSIS — R0989 Other specified symptoms and signs involving the circulatory and respiratory systems: Secondary | ICD-10-CM

## 2020-11-23 LAB — COMPLETE METABOLIC PANEL WITH GFR
AG Ratio: 1.3 (calc) (ref 1.0–2.5)
ALT: 9 U/L (ref 9–46)
AST: 14 U/L (ref 10–35)
Albumin: 3.8 g/dL (ref 3.6–5.1)
Alkaline phosphatase (APISO): 45 U/L (ref 35–144)
BUN/Creatinine Ratio: 17 (calc) (ref 6–22)
BUN: 48 mg/dL — ABNORMAL HIGH (ref 7–25)
CO2: 23 mmol/L (ref 20–32)
Calcium: 9.8 mg/dL (ref 8.6–10.3)
Chloride: 98 mmol/L (ref 98–110)
Creat: 2.82 mg/dL — ABNORMAL HIGH (ref 0.70–1.11)
GFR, Est African American: 23 mL/min/{1.73_m2} — ABNORMAL LOW (ref >=60)
GFR, Est Non African American: 20 mL/min/{1.73_m2} — ABNORMAL LOW (ref >=60)
Globulin: 2.9 g/dL (calc) (ref 1.9–3.7)
Glucose, Bld: 240 mg/dL — ABNORMAL HIGH (ref 65–99)
Potassium: 5.4 mmol/L — ABNORMAL HIGH (ref 3.5–5.3)
Sodium: 131 mmol/L — ABNORMAL LOW (ref 135–146)
Total Bilirubin: 0.7 mg/dL (ref 0.2–1.2)
Total Protein: 6.7 g/dL (ref 6.1–8.1)

## 2020-11-23 MED ORDER — ATENOLOL 100 MG PO TABS: ORAL_TABLET | ORAL | 1 refills | Status: DC

## 2020-11-23 NOTE — Progress Notes (Signed)
============================================================ ============================================================  -    Glucose = 240 mg% - very high    - Avoid Sweets, Candy & White Stuff                                      White Rice, White Potatoes, White Flour  - Breads &  Pasta ============================================================ ============================================================  -  Also , kidney function has worsened dramatically                                                 over the last 3-4 months and                                                                 especially since OV 1 month ago  - Appears dehydrated like not drinking enough fluids or water   - important to drink at least 6 bottles - 16 oz of fluids or water /day   - that's about 3 liters fluids/day  - Think about the large 2 liter bottle - So 1 and 1/2 of those   - Also recommend stop the Ziac or Bisoprolol - Hctz which has                                          a small amount of diuretic / fluid pill in it  and                                                sent in a new Rx to replace with Atenolol for BP  - Recc OV in 10-14 days to recheck BP  & recheck Kidneys . ============================================================ ============================================================

## 2020-12-06 ENCOUNTER — Other Ambulatory Visit: Payer: Self-pay

## 2020-12-06 ENCOUNTER — Ambulatory Visit (INDEPENDENT_AMBULATORY_CARE_PROVIDER_SITE_OTHER): Payer: Medicare Other | Admitting: Internal Medicine

## 2020-12-06 ENCOUNTER — Encounter: Payer: Self-pay | Admitting: Internal Medicine

## 2020-12-06 VITALS — BP 136/72 | HR 46 | Temp 97.5°F | Resp 16 | Ht 72.0 in | Wt 191.6 lb

## 2020-12-06 DIAGNOSIS — R0989 Other specified symptoms and signs involving the circulatory and respiratory systems: Secondary | ICD-10-CM | POA: Diagnosis not present

## 2020-12-06 DIAGNOSIS — N179 Acute kidney failure, unspecified: Secondary | ICD-10-CM

## 2020-12-06 DIAGNOSIS — Z79899 Other long term (current) drug therapy: Secondary | ICD-10-CM | POA: Diagnosis not present

## 2020-12-06 DIAGNOSIS — E1122 Type 2 diabetes mellitus with diabetic chronic kidney disease: Secondary | ICD-10-CM | POA: Diagnosis not present

## 2020-12-06 DIAGNOSIS — N184 Chronic kidney disease, stage 4 (severe): Secondary | ICD-10-CM

## 2020-12-06 NOTE — Progress Notes (Signed)
Future Appointments  Date Time Provider [place]  12/06/2020 11:45 AM [name], MD [facility] None  01/17/2021 - Wellness  11:00 AM Liane Comber, NP [facility] None  10/09/2021 - CPE  9:00 AM Liane Comber, NP [facility] None    History of Present Illness:      Patient is a very nice 87 [place]WMwith  labile HTN / CKD3/4, HLD,T2_DM /CKD3/4and Vitamin D Deficiency who was seen recently in f/u of a small CVA by MRI.           At that visit his labs showed worsening of Kidney functions with GFR drop from 45 one  year ago (12/2019) to  34 (03/2020 ) down to GFR 20 on 11/22/2020. Patient was advised to increase fluids to 3 lit/day and HCTZ 6.25 mg was removed from his Ziac and replaced with Atenolol.  He returns today for repeat labs.  Medications  Current Outpatient Medications (Endocrine & Metabolic):  .  glimepiride (AMARYL) 4 MG tablet, Take 1 tab with breakfast and 1/2-1 tab with supper for sugars/diabetes.  Current Outpatient Medications (Cardiovascular):  .  amLODipine (NORVASC) 5 MG tablet, Take 1/2  Tablet 2 x /day - Am & Pm for BP .  atenolol (TENORMIN) 100 MG tablet, Take 1 tablet Daily for BP .  fenofibrate micronized (LOFIBRA) 134 MG capsule, Take 1 capsule Daily for Triglycerides (Blood Fats) .  olmesartan (BENICAR) 40 MG tablet, Take  1/2 tablet (20 mg)   2 x /day - Am & Pm for BP .  simvastatin (ZOCOR) 40 MG tablet, Take 1 tablet at Bedtime for Cholesterol   Current Outpatient Medications (Analgesics):  .  aspirin 81 MG tablet, Take 81 mg by mouth daily.  Current Outpatient Medications (Hematological):  .  clopidogrel (PLAVIX) 75 MG tablet, Take  1 tablet  Daily  to Prevent Strokes  Current Outpatient Medications (Other):  .  Blood Glucose Monitoring Suppl DEVI, Please provide glucometer per insurance coverage.  Insurance no longer covers supplies for current Freestyle Freedom lite.  Check blood glucose three times a day and as needed. .   Cholecalciferol (VITAMIN D-3) 1000 UNITS CAPS, Take 5,000 Units by mouth daily. [name]  glucose blood (FREESTYLE LITE) test strip, USE 1 STRIP TO CHECK GLUCOSE ONCE DAILY. Dx: [id number].[id number] .  glucose blood test strip, Use as instructed.  Check blood glucose three times a day and as needed. .  Magnesium 400 MG CAPS, Take 400 mg by mouth daily.  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   Observations/Objective:  BP 136/72 (BP Location: Right Arm, Patient Position: Sitting, Cuff Size: Large)   Pulse (!) 46   Temp (!) 97.5 F (36.4 C)   Resp 16   Ht 6' (1.829 m)   Wt 191 lb 9.6 oz (86.9 kg)   SpO2 95%   BMI 25.99 kg/m   HEENT - WNL. Neck - supple.  Chest - Clear equal BS. Cor - Nl HS. RRR w/o sig MGR. PP 1(+). No edema. MS- FROM w/o deformities.  Gait Nl. Neuro -  Nl w/o focal abnormalities.  Assessment and Plan:  1. Labile hypertension  - COMPLETE METABOLIC PANEL WITH GFR   2. AKI (acute kidney injury) ([place])  - COMPLETE METABOLIC PANEL WITH GFR   3. Type 2 diabetes mellitus with stage 4 chronic kidney disease, without long-term current use of insulin (HCC)  - COMPLETE METABOLIC PANEL WITH GFR   4. Medication management  - COMPLETE METABOLIC PANEL WITH GFR   Follow Up Instructions:        I discussed the assessment and treatment plan with the patient. The patient was provided an opportunity to ask questions and all were answered. The patient agreed with the plan and  demonstrated an understanding of the instructions.       The patient was advised to call back or seek an in-person evaluation if the symptoms worsen or if the condition fails to improve as anticipated.    Kirtland Bouchard, MD

## 2020-12-06 NOTE — Patient Instructions (Signed)

## 2020-12-07 ENCOUNTER — Ambulatory Visit: Payer: Medicare Other | Admitting: Adult Health Nurse Practitioner

## 2020-12-07 LAB — COMPLETE METABOLIC PANEL WITH GFR
AG Ratio: 1.5 (calc) (ref 1.0–2.5)
ALT: 11 U/L (ref 9–46)
AST: 17 U/L (ref 10–35)
Albumin: 4 g/dL (ref 3.6–5.1)
Alkaline phosphatase (APISO): 42 U/L (ref 35–144)
BUN/Creatinine Ratio: 15 (calc) (ref 6–22)
BUN: 43 mg/dL — ABNORMAL HIGH (ref 7–25)
CO2: 22 mmol/L (ref 20–32)
Calcium: 9.4 mg/dL (ref 8.6–10.3)
Chloride: 100 mmol/L (ref 98–110)
Creat: 2.83 mg/dL — ABNORMAL HIGH (ref 0.70–1.11)
GFR, Est African American: 23 mL/min/{1.73_m2} — ABNORMAL LOW (ref >=60)
GFR, Est Non African American: 20 mL/min/{1.73_m2} — ABNORMAL LOW (ref >=60)
Globulin: 2.6 g/dL (calc) (ref 1.9–3.7)
Glucose, Bld: 201 mg/dL — ABNORMAL HIGH (ref 65–99)
Potassium: 5.1 mmol/L (ref 3.5–5.3)
Sodium: 129 mmol/L — ABNORMAL LOW (ref 135–146)
Total Bilirubin: 0.7 mg/dL (ref 0.2–1.2)
Total Protein: 6.6 g/dL (ref 6.1–8.1)

## 2020-12-08 NOTE — Progress Notes (Signed)
Pt was called to discuss his labs. I spoke with his daughter who is his POA and she stated she understood his labs and will pick up his new prescription  12/08/20 DG/[facility]MA.

## 2020-12-08 NOTE — Progress Notes (Signed)
============================================================ ============================================================  -    Blood test shows Kidney functions are about the same - not much better    - Still Appears dehydrated like not drinking enough fluids or water   - important to drink at least 6 bottles - 16 oz of fluids or water /day   - that's about 3 liters fluids/day  - Think about the large 2 liter bottle - So 1 and 1/2 of those   - Also recommend stop the Ziac or Bisoprolol - Hctz which has                     a small amount of diuretic / fluid pill in it and                        sent in a new Rx to replace with Atenolol for BP  - Recc OV in 10-14 days to recheck BP & recheck Kidneys   =========================================================== ===========================================================

## 2020-12-12 ENCOUNTER — Other Ambulatory Visit: Payer: Self-pay | Admitting: Adult Health

## 2020-12-12 DIAGNOSIS — I129 Hypertensive chronic kidney disease with stage 1 through stage 4 chronic kidney disease, or unspecified chronic kidney disease: Secondary | ICD-10-CM | POA: Diagnosis not present

## 2020-12-12 DIAGNOSIS — R0989 Other specified symptoms and signs involving the circulatory and respiratory systems: Secondary | ICD-10-CM

## 2020-12-12 DIAGNOSIS — E785 Hyperlipidemia, unspecified: Secondary | ICD-10-CM | POA: Diagnosis not present

## 2020-12-12 DIAGNOSIS — E1122 Type 2 diabetes mellitus with diabetic chronic kidney disease: Secondary | ICD-10-CM | POA: Diagnosis not present

## 2020-12-12 DIAGNOSIS — N184 Chronic kidney disease, stage 4 (severe): Secondary | ICD-10-CM | POA: Diagnosis not present

## 2020-12-13 ENCOUNTER — Other Ambulatory Visit: Payer: Self-pay | Admitting: Internal Medicine

## 2020-12-13 DIAGNOSIS — N184 Chronic kidney disease, stage 4 (severe): Secondary | ICD-10-CM

## 2020-12-18 ENCOUNTER — Other Ambulatory Visit: Payer: Medicare Other

## 2020-12-25 ENCOUNTER — Ambulatory Visit
Admission: RE | Admit: 2020-12-25 | Discharge: 2020-12-25 | Disposition: A | Discharge status: Home / Self Care | Payer: Medicare Other | Source: Ambulatory Visit | Attending: Internal Medicine | Admitting: Internal Medicine

## 2020-12-25 DIAGNOSIS — N184 Chronic kidney disease, stage 4 (severe): Secondary | ICD-10-CM

## 2020-12-25 DIAGNOSIS — N189 Chronic kidney disease, unspecified: Secondary | ICD-10-CM | POA: Diagnosis not present

## 2020-12-25 DIAGNOSIS — N1330 Unspecified hydronephrosis: Secondary | ICD-10-CM | POA: Diagnosis not present

## 2020-12-29 ENCOUNTER — Other Ambulatory Visit: Payer: Self-pay | Admitting: Internal Medicine

## 2020-12-29 DIAGNOSIS — N2889 Other specified disorders of kidney and ureter: Secondary | ICD-10-CM

## 2020-12-29 DIAGNOSIS — N1330 Unspecified hydronephrosis: Secondary | ICD-10-CM

## 2021-01-02 DIAGNOSIS — E1122 Type 2 diabetes mellitus with diabetic chronic kidney disease: Secondary | ICD-10-CM | POA: Diagnosis not present

## 2021-01-05 ENCOUNTER — Other Ambulatory Visit: Payer: Self-pay | Admitting: Adult Health

## 2021-01-05 DIAGNOSIS — E1122 Type 2 diabetes mellitus with diabetic chronic kidney disease: Secondary | ICD-10-CM

## 2021-01-10 DIAGNOSIS — H532 Diplopia: Secondary | ICD-10-CM | POA: Diagnosis not present

## 2021-01-10 DIAGNOSIS — N184 Chronic kidney disease, stage 4 (severe): Secondary | ICD-10-CM | POA: Diagnosis not present

## 2021-01-11 DIAGNOSIS — D49512 Neoplasm of unspecified behavior of left kidney: Secondary | ICD-10-CM | POA: Diagnosis not present

## 2021-01-11 DIAGNOSIS — N130 Hydronephrosis with ureteropelvic junction obstruction: Secondary | ICD-10-CM | POA: Diagnosis not present

## 2021-01-11 DIAGNOSIS — R1084 Generalized abdominal pain: Secondary | ICD-10-CM | POA: Diagnosis not present

## 2021-01-11 DIAGNOSIS — N184 Chronic kidney disease, stage 4 (severe): Secondary | ICD-10-CM | POA: Diagnosis not present

## 2021-01-14 ENCOUNTER — Emergency Department (HOSPITAL_COMMUNITY)
Admission: EM | Admit: 2021-01-14 | Discharge: 2021-01-15 | Disposition: A | Discharge status: Home / Self Care | Payer: Medicare Other | Attending: Emergency Medicine | Admitting: Emergency Medicine

## 2021-01-14 ENCOUNTER — Other Ambulatory Visit: Payer: Self-pay

## 2021-01-14 ENCOUNTER — Emergency Department (HOSPITAL_COMMUNITY): Payer: Medicare Other

## 2021-01-14 DIAGNOSIS — N184 Chronic kidney disease, stage 4 (severe): Secondary | ICD-10-CM | POA: Diagnosis not present

## 2021-01-14 DIAGNOSIS — Z79899 Other long term (current) drug therapy: Secondary | ICD-10-CM | POA: Insufficient documentation

## 2021-01-14 DIAGNOSIS — K8020 Calculus of gallbladder without cholecystitis without obstruction: Secondary | ICD-10-CM | POA: Diagnosis not present

## 2021-01-14 DIAGNOSIS — R0602 Shortness of breath: Secondary | ICD-10-CM | POA: Diagnosis not present

## 2021-01-14 DIAGNOSIS — J449 Chronic obstructive pulmonary disease, unspecified: Secondary | ICD-10-CM | POA: Insufficient documentation

## 2021-01-14 DIAGNOSIS — Z7902 Long term (current) use of antithrombotics/antiplatelets: Secondary | ICD-10-CM | POA: Insufficient documentation

## 2021-01-14 DIAGNOSIS — E785 Hyperlipidemia, unspecified: Secondary | ICD-10-CM | POA: Diagnosis not present

## 2021-01-14 DIAGNOSIS — E161 Other hypoglycemia: Secondary | ICD-10-CM | POA: Diagnosis not present

## 2021-01-14 DIAGNOSIS — I10 Essential (primary) hypertension: Secondary | ICD-10-CM | POA: Diagnosis not present

## 2021-01-14 DIAGNOSIS — N2889 Other specified disorders of kidney and ureter: Secondary | ICD-10-CM | POA: Diagnosis not present

## 2021-01-14 DIAGNOSIS — E11649 Type 2 diabetes mellitus with hypoglycemia without coma: Secondary | ICD-10-CM | POA: Insufficient documentation

## 2021-01-14 DIAGNOSIS — Z8673 Personal history of transient ischemic attack (TIA), and cerebral infarction without residual deficits: Secondary | ICD-10-CM | POA: Diagnosis not present

## 2021-01-14 DIAGNOSIS — E162 Hypoglycemia, unspecified: Secondary | ICD-10-CM

## 2021-01-14 DIAGNOSIS — R0989 Other specified symptoms and signs involving the circulatory and respiratory systems: Secondary | ICD-10-CM

## 2021-01-14 DIAGNOSIS — R21 Rash and other nonspecific skin eruption: Secondary | ICD-10-CM | POA: Insufficient documentation

## 2021-01-14 DIAGNOSIS — R739 Hyperglycemia, unspecified: Secondary | ICD-10-CM | POA: Diagnosis not present

## 2021-01-14 DIAGNOSIS — I129 Hypertensive chronic kidney disease with stage 1 through stage 4 chronic kidney disease, or unspecified chronic kidney disease: Secondary | ICD-10-CM | POA: Diagnosis not present

## 2021-01-14 DIAGNOSIS — R001 Bradycardia, unspecified: Secondary | ICD-10-CM | POA: Diagnosis not present

## 2021-01-14 DIAGNOSIS — K5730 Diverticulosis of large intestine without perforation or abscess without bleeding: Secondary | ICD-10-CM | POA: Diagnosis not present

## 2021-01-14 DIAGNOSIS — N289 Disorder of kidney and ureter, unspecified: Secondary | ICD-10-CM

## 2021-01-14 DIAGNOSIS — I2510 Atherosclerotic heart disease of native coronary artery without angina pectoris: Secondary | ICD-10-CM | POA: Insufficient documentation

## 2021-01-14 DIAGNOSIS — I517 Cardiomegaly: Secondary | ICD-10-CM | POA: Diagnosis not present

## 2021-01-14 DIAGNOSIS — E1122 Type 2 diabetes mellitus with diabetic chronic kidney disease: Secondary | ICD-10-CM | POA: Insufficient documentation

## 2021-01-14 DIAGNOSIS — R531 Weakness: Secondary | ICD-10-CM | POA: Insufficient documentation

## 2021-01-14 DIAGNOSIS — E1169 Type 2 diabetes mellitus with other specified complication: Secondary | ICD-10-CM | POA: Diagnosis not present

## 2021-01-14 DIAGNOSIS — Z87891 Personal history of nicotine dependence: Secondary | ICD-10-CM | POA: Insufficient documentation

## 2021-01-14 DIAGNOSIS — R404 Transient alteration of awareness: Secondary | ICD-10-CM | POA: Diagnosis not present

## 2021-01-14 LAB — CBG MONITORING, ED: Glucose-Capillary: 161 mg/dL — ABNORMAL HIGH (ref 70–99)

## 2021-01-14 NOTE — ED Triage Notes (Signed)
BIB [facility] from home. Pt lives with daughter. Initial call was for stroke like symptoms. When EMS arrived pt was diaphoretic and had slurred speech. Pt CBG was 43. Received 24g of glucose oral and 25g IV D5. Symptoms resolved after medication. CBG 176.  Family wanted to be evaluated. Hx: DM  142/70 46 heart rate- baseline 98% room air.  18 R AC

## 2021-01-14 NOTE — ED Provider Notes (Signed)
[facility] EMERGENCY DEPARTMENT Provider Note   CSN: [id number] Arrival date & time: 01/14/21  2306     History Chief Complaint  Patient presents with   Hypoglycemia    Isaiah Jackson is a 87 y.o. male.  All patient remembers from his hypoglycemic episode is that he felt weird.  Apparently was called out for stroke alert secondary to weakness.  On review of his records it does show that he had an acute stroke back in April.  No neurodeficits at this time.  Is alert and oriented to self place time and situation. Apparently had a recent diagnosis of likely left renal cancer.  It appears to ensure this is still being worked up.  He complains of left-sided pain but it seems that maybe this may be more chronic in nature and related to the diagnosis.    Hypoglycemia Initial blood sugar:  42 Blood sugar after intervention:  176 Severity:  Mild Onset quality:  Gradual Timing:  Constant Progression:  Improving Chronicity:  New Diabetic status:  Controlled with oral medications Context comment:  New dx of likely L renal cancer Associated symptoms: no altered mental status, no anxiety, no decreased responsiveness, no seizures and no shortness of breath       Past Medical History:  Diagnosis Date   Benign prostatic hypertrophy    history of post catheterization   COPD (chronic obstructive pulmonary disease) (HCC)    Coronary artery disease    s/p PTA and stenting   Diabetes mellitus    Hyperlipidemia    Hypertension    Kidney stone    Myocardial infarction ([place])    Nephrolithiasis    history of cystoscopy   Vertigo 07/02/2017   Vitamin D deficiency     Patient Active Problem List   Diagnosis Date Noted   Actinic keratoses 09/01/2019   Former smoker (86 pack/year history, quit 1991) 08/31/2019   CKD stage 4 due to type 2 diabetes mellitus ([place]) 10/13/2017   Atherosclerosis of aorta ([place]) 10/11/2016   PAD (peripheral artery disease) ([place]) 10/11/2016    Overweight (BMI 25.0-29.9) 05/25/2015   ASCAD s/p PTCA/Stents 11/07/2014   Medication management 10/13/2013   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 Katy Fitch   cytoscopy     for kidney stones   SHOULDER SURGERY Right        Family History  Problem Relation Age of Onset   Diabetes Mother    Heart disease Mother    Heart attack Mother 83       mother passed as a result   Diabetes Daughter    Diabetes Sister    Heart disease Father     Social History   Tobacco Use   Smoking status: Former    Packs/day: 1.00    Years: 42.00    Pack years: 42.00    Types: Cigarettes    Quit date: 1991    Years since quitting: 31.5   Smokeless tobacco: Never  Vaping Use   Vaping Use: Never used  Substance Use Topics   Alcohol use: No  Drug use: No    Home Medications Prior to Admission medications   Medication Sig Start Date End Date Taking? Authorizing Provider  Blood Glucose Monitoring Suppl DEVI Please provide glucometer per insurance coverage.  Insurance no longer covers supplies for current Freestyle Freedom lite.  Check blood glucose three times a day and as needed. 09/30/19  Yes McClanahan, Danton Sewer, NP  clopidogrel (PLAVIX) 75 MG tablet Take  1 tablet  Daily  to Prevent Strokes Patient taking differently: Take 75 mg by mouth daily. 10/18/20  Yes [name], MD  fenofibrate micronized (LOFIBRA) 134 MG capsule Take 1 capsule Daily for Triglycerides (Blood Fats) Patient taking differently: Take 134 mg by mouth daily. 10/09/20  Yes Liane Comber, NP  glucose blood test strip Use as  instructed.  Check blood glucose three times a day and as needed. 09/30/19  Yes McClanahan, Danton Sewer, NP  JARDIANCE 10 MG TABS tablet Take 10 mg by mouth every evening. 12/26/20  Yes [provider]  olmesartan (BENICAR) 40 MG tablet Take  1/2 tablet (20 mg)   2 x /day - Am & Pm for BP Patient taking differently: Take 20 mg by mouth in the morning and at bedtime. 11/01/20  Yes [name], MD  simvastatin (ZOCOR) 40 MG tablet Take 1 tablet at Bedtime for Cholesterol Patient taking differently: Take 40 mg by mouth at bedtime. 10/09/20  Yes Liane Comber, NP  amLODipine (NORVASC) 5 MG tablet Take 2 tablets (10 mg total) by mouth at bedtime. Take 1 tablet in the evening for BP 01/15/21   Krisandra Bueno, Corene Cornea, MD  glucose blood (FREESTYLE LITE) test strip USE 1 STRIP TO CHECK GLUCOSE ONCE DAILY. Dx: E11.29 09/21/19   Garnet Sierras, NP    Allergies    Bee venom, Capoten [captopril], and Lamisil [terbinafine]  Review of Systems   Review of Systems  Constitutional:  Negative for decreased responsiveness.  Respiratory:  Negative for shortness of breath.   Neurological:  Negative for seizures.  Psychiatric/Behavioral:  The patient is not nervous/anxious.   All other systems reviewed and are negative.  Physical Exam Updated Vital Signs BP (!) 178/68 (BP Location: Left Arm)   Pulse (!) 44   Temp (!) 97.5 F (36.4 C) (Oral)   Resp 18   Ht 6\' 2"  (1.88 m)   Wt 89.8 kg   SpO2 97%   BMI 25.42 kg/m   Physical Exam Vitals and nursing note reviewed.  HENT:     Head: Normocephalic.     Nose: Nose normal. No congestion or rhinorrhea.     Mouth/Throat:     Mouth: Mucous membranes are moist.     Pharynx: Oropharynx is clear.  Eyes:     Pupils: Pupils are equal, round, and reactive to light.  Cardiovascular:     Rate and Rhythm: Bradycardia present.     Comments: Heart rate low as 42 reportedly baseline. Pulmonary:     Effort: Pulmonary effort is normal.  Abdominal:     General: Abdomen is  flat.  Musculoskeletal:        General: No swelling or tenderness. Normal range of motion.  Skin:    General: Skin is warm and dry.     Findings: Rash (diffuse macular rash to forehead, also with dry skin and scaly plaques throughout torso and upper extremities, baseline) present.  Neurological:     General: No focal deficit present.     Mental Status: He is oriented to person, place, and time. Mental status is at  baseline.     Comments: No altered mental status, able to give full seemingly accurate history.  Face is symmetric, EOM's intact, pupils equal and reactive, vision intact, tongue and uvula midline without deviation. Upper and Lower extremity motor 5/5, intact pain perception in distal extremities, 2+ reflexes in biceps, patella and achilles tendons.     ED Results / Procedures / Treatments   Labs (all labs ordered are listed, but only abnormal results are displayed) Labs Reviewed  CBC WITH DIFFERENTIAL/PLATELET - Abnormal; Notable for the following components:      Result Value   RBC 7.18 (*)    Hemoglobin 17.5 (*)    HCT 56.6 (*)    MCV 78.8 (*)    MCH 24.4 (*)    RDW 19.7 (*)    All other components within normal limits  COMPREHENSIVE METABOLIC PANEL - Abnormal; Notable for the following components:   Sodium 128 (*)    CO2 17 (*)    Glucose, Bld 171 (*)    BUN 39 (*)    Creatinine, Ser 2.62 (*)    Total Protein 6.4 (*)    Albumin 2.8 (*)    Total Bilirubin 1.3 (*)    GFR, Estimated 23 (*)    All other components within normal limits  URINALYSIS, ROUTINE W REFLEX MICROSCOPIC - Abnormal; Notable for the following components:   Glucose, UA >=500 (*)    All other components within normal limits  CBG MONITORING, ED - Abnormal; Notable for the following components:   Glucose-Capillary 161 (*)    All other components within normal limits  CBG MONITORING, ED - Abnormal; Notable for the following components:   Glucose-Capillary 60 (*)    All other components within  normal limits  CBG MONITORING, ED - Abnormal; Notable for the following components:   Glucose-Capillary 104 (*)    All other components within normal limits  LIPASE, BLOOD  TSH  CBG MONITORING, ED  TROPONIN I (HIGH SENSITIVITY)    EKG EKG Interpretation  Date/Time:  Sunday January 14 2021 23:57:12 EDT Ventricular Rate:  39 PR Interval:  180 QRS Duration: 117 QT Interval:  548 QTC Calculation: 442 R Axis:   -32 Text Interpretation: Sinus bradycardia Probable left atrial enlargement LVH with secondary repolarization abnormality Probable lateral infarct, age indeterminate Anterior infarct, old lvh new from 2007 Confirmed by Merrily Pew [phone]) on 01/15/2021 12:32:06 AM  Radiology CT Head Wo Contrast  Result Date: 01/15/2021 CLINICAL DATA:  87 year old male with concern for intracranial hemorrhage. EXAM: CT HEAD WITHOUT CONTRAST TECHNIQUE: Contiguous axial images were obtained from the base of the skull through the vertex without intravenous contrast. COMPARISON:  Brain MRI dated 10/14/2020 FINDINGS: Brain: Mild age-related atrophy and chronic microvascular ischemic changes. Probable punctate old lacunar infarct in the left corona radiata. There is no acute intracranial hemorrhage. No mass effect or midline shift. No extra-axial fluid collection. Vascular: No hyperdense vessel or unexpected calcification. Skull: Normal. Negative for fracture or focal lesion. Sinuses/Orbits: Partial opacification of the left maxillary sinus. No air-fluid level. Right mastoid effusion. Other: None IMPRESSION: 1. No acute intracranial pathology. 2. Mild age-related atrophy and chronic microvascular ischemic changes. Electronically Signed   By: Anner Crete M.D.   On: 01/15/2021 02:22   DG Chest Portable 1 View  Result Date: 01/14/2021 CLINICAL DATA:  87 year old male with shortness of breath. EXAM: PORTABLE CHEST 1 VIEW COMPARISON:  Chest radiograph dated 2212. FINDINGS: Bibasilar atelectasis/scarring. No  focal consolidation, pleural effusion, or pneumothorax. Mild cardiomegaly.  Atherosclerotic calcification of the aorta. No acute osseous pathology. IMPRESSION: No active disease. Electronically Signed   By: Anner Crete M.D.   On: 01/14/2021 23:59   CT RENAL STONE STUDY  Result Date: 01/15/2021 CLINICAL DATA:  87 year old male with abdominal pain. EXAM: CT ABDOMEN AND PELVIS WITHOUT CONTRAST TECHNIQUE: Multidetector CT imaging of the abdomen and pelvis was performed following the standard protocol without IV contrast. COMPARISON:  CT abdomen pelvis dated 10/18/2009. FINDINGS: Evaluation of this exam is limited in the absence of intravenous contrast. Lower chest: Minimal bibasilar atelectasis/scarring. The visualized lung bases are otherwise clear. Mild cardiomegaly. There is coronary vascular calcification. No intra-abdominal free air or free fluid. Hepatobiliary: The liver is unremarkable. No intrahepatic biliary ductal dilatation. Multiple gallstones. No pericholecystic fluid or evidence of acute cholecystitis by CT Pancreas: Unremarkable. No pancreatic ductal dilatation or surrounding inflammatory changes. Spleen: Normal in size without focal abnormality. Adrenals/Urinary Tract: The adrenal glands are unremarkable. There is a 6.5 x 7.0 x 8.0 cm mass involving the mid to lower pole of the left kidney most consistent with malignancy. Further evaluation with MRI without and with contrast on a nonemergent basis recommended. There is nodularity of the left perinephric fat within the Gerota's fascia. There is associated mass effect and compression/infiltration of the ureteropelvic junction with mild left hydronephrosis. There is mild right renal parenchyma atrophy. There is no hydronephrosis or nephrolithiasis on the right. The right ureter and urinary bladder appear unremarkable. Stomach/Bowel: There is sigmoid diverticulosis without active inflammatory changes. There is no bowel obstruction or active  inflammation. The appendix is normal. Vascular/Lymphatic: An aorto bi iliac endovascular stent graft repair noted. Evaluation of the vasculature is limited in the absence of intravenous contrast. The IVC is unremarkable. No portal venous gas. There is no adenopathy. Reproductive: The prostate and seminal vesicles are grossly unremarkable. Other: None Musculoskeletal: Osteopenia with degenerative changes of the spine. No acute osseous pathology. IMPRESSION: 1. Left renal mid to lower pole mass most consistent with malignancy. Further evaluation with MRI without and with contrast on a nonemergent basis recommended. 2. Cholelithiasis. 3. Sigmoid diverticulosis. No bowel obstruction. Normal appendix. 4. Aortic Atherosclerosis (ICD10-I70.0). Electronically Signed   By: Anner Crete M.D.   On: 01/15/2021 02:28    Procedures Procedures   Medications Ordered in ED Medications  lactated ringers bolus 1,000 mL (0 mLs Intravenous Stopped 01/15/21 0546)  amLODipine (NORVASC) tablet 10 mg (10 mg Oral Given 01/15/21 0549)  clopidogrel (PLAVIX) tablet 75 mg (75 mg Oral Given 01/15/21 0549)    ED Course  I have reviewed the triage vital signs and the nursing notes.  Pertinent labs & imaging results that were available during my care of the patient were reviewed by me and considered in my medical decision making (see chart for details).    MDM Rules/Calculators/A&P                         87 year old male with medical problems documented below presents emerged part today secondary to hypoglycemic episode.  Resolved prior to arrival.  Patient with some left-sided pains will evaluate for cardiac etiology.  He scheduled for CT abdomen pelvis with contrast has some pretty severe sounding left sided pain however not really pain with palpation.  Will evaluate with a CT scan here.  With his history of stroke and his hypoglycemic episode also consider possible cardiac and neurologic causes we will get a head CT although  he is neurologically intact at this  time.  It could be that his renal function is worsening making him more prone to hypoglycemia.  Will observe and check serial CBGs. Patient's daughter arrived and stated that he has had some medication changes recently including starting Jardiance by his nephrologist.  States that he had a couple episodes similar to this in the last week or 2 since starting this medicine however this is by far the worst.  He was clammy and was saying that he was getting ready to die and that he was talking to his deceased wife and the Reita Cliche about coming to meet them.  He had a little slurred speech as well.  She states that he came back to almost perfectly normal after the blood sugar corrected.  When asked about his heart rate being low she is not aware of that but I reviewed the records it is there multiple times going back in the last 6 to 8 months.  We will go and discontinue his atenolol and have her check his heart rates, blood sugars and present blood pressures at home.  I think it is unlikely this is causing his symptoms as his heart rate was 40 when was talking to him he was alert oriented without any abnormalities.  We will also discontinue the Amaryl at this time and to stick with the Jardiance.  She will keep a log of heart rate, blood pressure and blood sugars for the next week or 2 until she can follow-up with a primary doctor.  Will return here for new or worsening symptoms.  On recheck after sleeping all night the patient's blood sugar was once again 60.  He is alert oriented and was acting normal.  Was offered something to eat his blood sugar improved.  Patient stable for discharge.  Final Clinical Impression(s) / ED Diagnoses Final diagnoses:  Labile hypertension  Hypoglycemia  Renal insufficiency  Left renal mass  Bradycardia    Rx / DC Orders ED Discharge Orders          Ordered    amLODipine (NORVASC) 5 MG tablet  Nightly       Note to Pharmacy: Patient knows  to take by mouth  !   - Thanks   01/15/21 0530             Sada Mazzoni, Corene Cornea, MD 01/15/21 [id number]

## 2021-01-15 ENCOUNTER — Emergency Department (HOSPITAL_COMMUNITY): Payer: Medicare Other

## 2021-01-15 ENCOUNTER — Ambulatory Visit: Payer: Medicare Other | Admitting: Adult Health Nurse Practitioner

## 2021-01-15 DIAGNOSIS — E11649 Type 2 diabetes mellitus with hypoglycemia without coma: Secondary | ICD-10-CM | POA: Diagnosis not present

## 2021-01-15 DIAGNOSIS — I10 Essential (primary) hypertension: Secondary | ICD-10-CM | POA: Diagnosis not present

## 2021-01-15 DIAGNOSIS — N2889 Other specified disorders of kidney and ureter: Secondary | ICD-10-CM | POA: Diagnosis not present

## 2021-01-15 LAB — TSH: TSH: 2.517 u[IU]/mL (ref 0.350–4.500)

## 2021-01-15 LAB — CBG MONITORING, ED
Glucose-Capillary: 60 mg/dL — ABNORMAL LOW (ref 70–99)
Glucose-Capillary: 104 mg/dL — ABNORMAL HIGH (ref 70–99)

## 2021-01-15 LAB — URINALYSIS, ROUTINE W REFLEX MICROSCOPIC
Bacteria, UA: NONE SEEN
Bilirubin Urine: NEGATIVE
Glucose, UA: >=500 mg/dL — AB
Hgb urine dipstick: NEGATIVE
Ketones, ur: NEGATIVE mg/dL
Leukocytes,Ua: NEGATIVE
Nitrite: NEGATIVE
Protein, ur: NEGATIVE mg/dL
Specific Gravity, Urine: 1.013 (ref 1.005–1.030)
pH: 5 (ref 5.0–8.0)

## 2021-01-15 LAB — CBC WITH DIFFERENTIAL/PLATELET
Abs Immature Granulocytes: 0.03 10*3/uL (ref 0.00–0.07)
Basophils Absolute: 0 10*3/uL (ref 0.0–0.1)
Basophils Relative: 0 %
Eosinophils Absolute: 0 10*3/uL (ref 0.0–0.5)
Eosinophils Relative: 0 %
HCT: 56.6 % — ABNORMAL HIGH (ref 39.0–52.0)
Hemoglobin: 17.5 g/dL — ABNORMAL HIGH (ref 13.0–17.0)
Immature Granulocytes: 0 %
Lymphocytes Relative: 12 %
Lymphs Abs: 1.1 10*3/uL (ref 0.7–4.0)
MCH: 24.4 pg — ABNORMAL LOW (ref 26.0–34.0)
MCHC: 30.9 g/dL (ref 30.0–36.0)
MCV: 78.8 fL — ABNORMAL LOW (ref 80.0–100.0)
Monocytes Absolute: 0.9 10*3/uL (ref 0.1–1.0)
Monocytes Relative: 10 %
Neutro Abs: 7 10*3/uL (ref 1.7–7.7)
Neutrophils Relative %: 78 %
Platelets: 254 10*3/uL (ref 150–400)
RBC: 7.18 MIL/uL — ABNORMAL HIGH (ref 4.22–5.81)
RDW: 19.7 % — ABNORMAL HIGH (ref 11.5–15.5)
WBC: 9.1 10*3/uL (ref 4.0–10.5)
nRBC: 0 % (ref 0.0–0.2)

## 2021-01-15 LAB — COMPREHENSIVE METABOLIC PANEL
ALT: 12 U/L (ref 0–44)
AST: 22 U/L (ref 15–41)
Albumin: 2.8 g/dL — ABNORMAL LOW (ref 3.5–5.0)
Alkaline Phosphatase: 42 U/L (ref 38–126)
Anion gap: 9 (ref 5–15)
BUN: 39 mg/dL — ABNORMAL HIGH (ref 8–23)
CO2: 17 mmol/L — ABNORMAL LOW (ref 22–32)
Calcium: 9.2 mg/dL (ref 8.9–10.3)
Chloride: 102 mmol/L (ref 98–111)
Creatinine, Ser: 2.62 mg/dL — ABNORMAL HIGH (ref 0.61–1.24)
GFR, Estimated: 23 mL/min — ABNORMAL LOW (ref >60)
Glucose, Bld: 171 mg/dL — ABNORMAL HIGH (ref 70–99)
Potassium: 4.4 mmol/L (ref 3.5–5.1)
Sodium: 128 mmol/L — ABNORMAL LOW (ref 135–145)
Total Bilirubin: 1.3 mg/dL — ABNORMAL HIGH (ref 0.3–1.2)
Total Protein: 6.4 g/dL — ABNORMAL LOW (ref 6.5–8.1)

## 2021-01-15 LAB — TROPONIN I (HIGH SENSITIVITY): Troponin I (High Sensitivity): 17 ng/L (ref <18)

## 2021-01-15 LAB — LIPASE, BLOOD: Lipase: 33 U/L (ref 11–51)

## 2021-01-15 MED ORDER — CLOPIDOGREL BISULFATE 75 MG PO TABS
75.0000 mg | ORAL_TABLET | Freq: Once | ORAL | Status: AC
  Administered 2021-01-15: 75 mg via ORAL
  Filled 2021-01-15: qty 1

## 2021-01-15 MED ORDER — AMLODIPINE BESYLATE 5 MG PO TABS: 10.0000 mg | ORAL_TABLET | Freq: Every evening | ORAL | 3 refills | Status: AC

## 2021-01-15 MED ORDER — LACTATED RINGERS IV BOLUS
1000.0000 mL | Freq: Once | INTRAVENOUS | Status: AC
  Administered 2021-01-15: 1000 mL via INTRAVENOUS

## 2021-01-15 MED ORDER — AMLODIPINE BESYLATE 5 MG PO TABS
10.0000 mg | ORAL_TABLET | Freq: Once | ORAL | Status: AC
  Administered 2021-01-15: 10 mg via ORAL
  Filled 2021-01-15: qty 2

## 2021-01-15 NOTE — ED Notes (Signed)
Patient and family member verbalizes understanding of discharge instructions. This RN reviewed pt medication list with daughter. I wrote down which medications needed to be taken this morning and afternoon and which medication to hold until tomorrow, which is the medication that lowers blood sugar. Changes in medication and follow-up care reviewed. Opportunity for questioning and answers were provided. Armband removed by staff, pt discharged from ED via wheelchair. Pt was ambulatory to and from the restroom prior to discharge.

## 2021-01-15 NOTE — ED Notes (Signed)
Pt transported to CT ?

## 2021-01-15 NOTE — Discharge Instructions (Addendum)
Please keep a log of heart rate, blood pressures and blood sugars over the next week or 2 until you follow-up with your doctor.  Please make sure that you change the medications based on the medication list provided in this paperwork.  Please make sure he is eating 3 or 4 meals a day and taking medications as prescribed.  Please return here for any new or worsening symptoms.

## 2021-01-15 NOTE — ED Notes (Signed)
Pt returned from CT °

## 2021-01-17 ENCOUNTER — Ambulatory Visit: Payer: Medicare Other | Admitting: Adult Health

## 2021-01-19 ENCOUNTER — Other Ambulatory Visit: Payer: Medicare Other

## 2021-01-24 NOTE — Progress Notes (Deleted)
MEDICARE ANNUAL WELLNESS VISIT AND FOLLOW UP Assessment:   Diagnoses and all orders for this visit:  Encounter for Medicare annual wellness exam Has had covid 19 vaccines - record requested Declines other vaccines Otherwise UTD  PAD (peripheral artery disease) ([place]) Control blood pressure, cholesterol, glucose, increase exercise.   Essential hypertension Labile; take losartan 50 mg 1 tab if <140/80, 2 tabs if running higher Monitor blood pressure at home; call if consistently above goal (140/90) despite increased dose Continue DASH diet.   Reminder to go to the ER if any CP, SOB, nausea, dizziness, severe HA, changes vision/speech, left arm numbness and tingling and jaw pain.  Atherosclerosis of aorta ([place]) Per CT 07/2018 Control blood pressure, cholesterol, glucose, increase exercise.   ASCAD s/p PTCA/Stents Control blood pressure, cholesterol, glucose, increase exercise.  Followed by cardiology  Aneurysm of abdominal vessel ([place]) S/p repair; control BP; followed by [facility]; stable on follow up imaging and was released by vascular -   Chronic bronchitis, unspecified chronic bronchitis type ([place]) Per imaging; Has stopped smoking, avoid triggers, no symptoms at this time.   Type 2 diabetes mellitus with stage 3 chronic kidney disease, without long-term current use of insulin [facility]) Education: Reviewed 'ABCs' of diabetes management (respective goals in parentheses):  A1C (<7), blood pressure (<130/80), and cholesterol (LDL <70) Continue metformin 500 mg, likely reduce to 2 tabs/day, titrate glimepiride if needed Eye Exam yearly and Dental Exam every 6 months - UTD Dietary recommendations Physical Activity recommendations  CKD stage 3 due to type 2 diabetes mellitus ([place]) If GFR trending down further will reduce metformin  Increase fluids, avoid NSAIDS, monitor sugars, will monitor Check CMP/GFR  Vitamin D deficiency At goal at recent check; continue to recommend  supplementation for goal of 70-100 Defer vitamin D level  Vertigo Hasn't had symptoms recently; has meclizine if needed   Medication management CBC, CMP/GFR, magnesium  Mixed hyperlipidemia associated with T2DM (HCC) Continue statin for LDL goal <70 Continue low cholesterol diet and exercise.  Check lipid panel.   BMI 26 Long discussion about weight loss, diet, and exercise Recommended diet heavy in fruits and veggies and low in animal meats, cheeses, and dairy products, appropriate calorie intake Discussed appropriate weight for height  Follow up at next visit   Over 30 minutes of exam, counseling, chart review, and critical decision making was performed  Future Appointments  Date Time Provider [place]  01/26/2021 11:00 AM Magda Bernheim, NP [facility] None  10/09/2021  9:00 AM Liane Comber, NP [facility] None    Plan:   During the course of the visit the patient was educated and counseled about appropriate screening and preventive services including:   Pneumococcal vaccine  Influenza vaccine Prevnar 13 Td vaccine Screening electrocardiogram Colorectal cancer screening Diabetes screening Glaucoma screening Nutrition counseling    Subjective:  Isaiah Jackson is a 87 y.o. male who presents for Medicare Annual Wellness Visit and 3 month follow up for HTN, hyperlipidemia, T2DM, and vitamin D Def.   ER visit 01/14/21 with following course:87 year old male with medical problems documented below presents emerged part today secondary to hypoglycemic episode.  Resolved prior to arrival.  Patient with some left-sided pains will evaluate for cardiac etiology.  He scheduled for CT abdomen pelvis with contrast has some pretty severe sounding left sided pain however not really pain with palpation.  Will evaluate with a CT scan here.  With his history of stroke and his hypoglycemic episode also consider possible cardiac and neurologic  causes we will get a head CT although he  is neurologically intact at this time.  It could be that his renal function is worsening making him more prone to hypoglycemia.  Will observe and check serial CBGs. Patient's daughter arrived and stated that he has had some medication changes recently including starting Jardiance by his nephrologist.  States that he had a couple episodes similar to this in the last week or 2 since starting this medicine however this is by far the worst.  He was clammy and was saying that he was getting ready to die and that he was talking to his deceased wife and the Reita Cliche about coming to meet them.  He had a little slurred speech as well.  She states that he came back to almost perfectly normal after the blood sugar corrected.  When asked about his heart rate being low she is not aware of that but I reviewed the records it is there multiple times going back in the last 6 to 8 months.  We will go and discontinue his atenolol and have her check his heart rates, blood sugars and present blood pressures at home.  I think it is unlikely this is causing his symptoms as his heart rate was 40 when was talking to him he was alert oriented without any abnormalities.  We will also discontinue the Amaryl at this time and to stick with the Jardiance.  She will keep a log of heart rate, blood pressure and blood sugars for the next week or 2 until she can follow-up with a primary doctor.  Will return here for new or worsening symptoms.   On recheck after sleeping all night the patient's blood sugar was once again 60.  He is alert oriented and was acting normal.  Was offered something to eat his blood sugar improved.  Patient stable for discharge.   Final Clinical Impression(s) / ED Diagnoses Final diagnoses:  Labile hypertension  Hypoglycemia  Renal insufficiency  Left renal mass  Bradycardia     In 1996 he had PTCA/stents and in 2006 he had a negative Cardiolite.  He has hx of AAA s/p stent graft by Dr. Oneida Alar which was stable for  several years monitored by [facility] and has since been released. He is a former smoker, 42 pack year history, quit in 1991. He had CT of chest in 07/2018 which showed aortic atherosclerosis and emphysema without concerning nodules.   BMI is There is no height or weight on file to calculate BMI., he has not been working on diet and exercise, but reports very active 3+ hours a day working on his property and caring for animals.  Wt Readings from Last 3 Encounters:  01/14/21 198 lb (89.8 kg)  12/06/20 191 lb 9.6 oz (86.9 kg)  11/22/20 190 lb 12.8 oz (86.5 kg)   His blood pressure has reportedly been labile, average 875I-433I/95J, systolic has been as low as 90 and high as 180 in the last 2 weeks per patient, today their BP is  , similar by manual recheck.  He does not workout. He denies chest pain, shortness of breath, dizziness.   He is on cholesterol medication (simvastatin 40 mg every other day) and denies myalgias. His LDL cholesterol is at goal. The cholesterol last visit was:   Lab Results  Component Value Date   CHOL 139 10/09/2020   HDL 48 10/09/2020   LDLCALC 74 10/09/2020   TRIG 88 10/09/2020   CHOLHDL 2.9 10/09/2020  He has been working on diet and exercise for T2DM (on metformin 500 mg x 4, glimepiride 4 mg daily in AM), and denies foot ulcerations, hypoglycemia , increased appetite, nausea, paresthesia of the feet, polydipsia, polyuria, visual disturbances, vomiting and weight loss.  He does check occasional fasting blood sugars, reports running between 115-140s. Rare up to 200. Last A1C in the office was:  Lab Results  Component Value Date   HGBA1C 7.3 (H) 10/09/2020   He has CKD III associated with T2DM monitored at this office:  Lab Results  Component Value Date   GFRNONAA 23 (L) 01/14/2021  Estimates 3-4 bottles of water daily.    Patient is on Vitamin D supplement and near goal at last check:    Lab Results  Component Value Date   VD25OH 61 10/09/2020       Medication Review: Current Outpatient Medications on File Prior to Visit  Medication Sig Dispense Refill   amLODipine (NORVASC) 5 MG tablet Take 2 tablets (10 mg total) by mouth at bedtime. Take 1 tablet in the evening for BP 90 tablet 3   Blood Glucose Monitoring Suppl DEVI Please provide glucometer per insurance coverage.  Insurance no longer covers supplies for current Freestyle Freedom lite.  Check blood glucose three times a day and as needed. 1 each 0   clopidogrel (PLAVIX) 75 MG tablet Take  1 tablet  Daily  to Prevent Strokes (Patient taking differently: Take 75 mg by mouth daily.) 90 tablet 3   fenofibrate micronized (LOFIBRA) 134 MG capsule Take 1 capsule Daily for Triglycerides (Blood Fats) (Patient taking differently: Take 134 mg by mouth daily.) 90 capsule 3   glucose blood (FREESTYLE LITE) test strip USE 1 STRIP TO CHECK GLUCOSE ONCE DAILY. Dx: E11.29 100 each 0   glucose blood test strip Use as instructed.  Check blood glucose three times a day and as needed. 100 each 4   JARDIANCE 10 MG TABS tablet Take 10 mg by mouth every evening.     olmesartan (BENICAR) 40 MG tablet Take  1/2 tablet (20 mg)   2 x /day - Am & Pm for BP (Patient taking differently: Take 20 mg by mouth in the morning and at bedtime.) 90 tablet 3   simvastatin (ZOCOR) 40 MG tablet Take 1 tablet at Bedtime for Cholesterol (Patient taking differently: Take 40 mg by mouth at bedtime.) 90 tablet 0   No current facility-administered medications on file prior to visit.    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  Screening Tests Immunization History  Administered Date(s) Administered   Influenza Whole 06/16/2012, 03/26/2013   Influenza, High Dose Seasonal PF 04/19/2014, 04/26/2015, 04/23/2016, 04/17/2017, 04/28/2018, 03/31/2019   Moderna Sars-Covid-2 Vaccination 09/06/2019, 10/04/2019, 06/22/2020   Td 05/26/2003   Preventative care: Last colonoscopy: 2014 per patient, was advised none further  CXR 07/2018 Carotid Doppler neg 2013 Stress test 2002  AB aorta 2014 CT chest 07/2018 -  emphysema, aortic atherosclerosis  Prior vaccinations: TD or Tdap: 2004, declines further Influenza: 03/2019 Pneumococcal: 2000, declines Prevnar 13: declines Shingles/Zostavax: declines Covid 19: 2/2, 2021 at [place], patient will call with information  Names of Other Physician/Practitioners you currently use: 1. [facility] here for primary care [facility]. [facility], last visit [facility]13/2019 - report verified and abstracted  3. , dentist, last visit 2018 - full dentures  4. Dr. Allyson Sabal, derm, last visit 2019, overdue, has scheduled this Friday with new Dr.   Patient Care Team: [name], MD as PCP - General (Internal Medicine) Jacolyn Reedy, MD as Consulting Physician (Cardiology) Elam Dutch, MD as Consulting Physician (Vascular Surgery) Laurence Spates, MD (Inactive) as Consulting Physician (Gastroenterology)  Surgical: He  has a past surgical history that includes Abdominal aortic aneurysm repair (10/23/2005); cytoscopy; Shoulder surgery (Right); and Cataract extraction, bilateral (Bilateral, 2021). Family His family history includes Diabetes in his daughter, mother, and sister; Heart attack (age of onset: 26) in  his mother; Heart disease in his father and mother. Social history  He reports that he quit smoking about 31 years ago. His smoking use included cigarettes. He has a 42.00 pack-year smoking history. He has never used smokeless tobacco. He reports that he does not drink alcohol and does not use drugs.  MEDICARE WELLNESS OBJECTIVES: Physical activity:   Cardiac risk factors:   Depression/mood screen:   Depression screen [facility] 2/9 11/21/2020  Decreased Interest 0  Down, Depressed, Hopeless 0  PHQ - 2 Score 0    ADLs:  In your present state of health, do you have any difficulty performing the following activities: 11/21/2020 03/22/2020  Hearing? N N  Vision? N N  Difficulty concentrating or making decisions? N N  Walking or climbing stairs? N N  Dressing or bathing? N N  Doing errands, shopping? N N  Some recent data might be hidden     Cognitive Testing  Alert? Yes  Normal Appearance?Yes  Oriented to person? Yes  Place? Yes   Time? Yes  Recall of three objects?  Yes  Can perform simple calculations? Yes  Displays appropriate judgment?Yes  Can read the correct time from a watch face?Yes  EOL planning:     Objective:   There were no vitals filed for this visit.  There is no height or weight on file to calculate BMI.  General appearance: alert, no distress, WD/WN, male HEENT: normocephalic, sclerae anicteric, TMs pearly, nares patent, no discharge or erythema, pharynx normal Oral cavity: MMM, no lesions. HOH, not wearing his hearing aids.  Neck: supple, no lymphadenopathy, no thyromegaly, no masses Heart: RRR, normal S1, S2, no murmurs Lungs: CTA bilaterally, no wheezes, rhonchi, or rales Abdomen: +bs, soft, non tender, non distended, no masses, no hepatomegaly, no splenomegaly Musculoskeletal: nontender, no swelling, no obvious deformity Extremities: no edema, no cyanosis, no clubbing Pulses: 2+ symmetric, upper and lower extremities, normal cap refill Neurological: alert,  oriented x 3, CN2-12 intact, strength normal upper extremities and lower extremities, sensation normal throughout, DTRs 2+ throughout, no cerebellar signs, gait slow steady Psychiatric: normal affect, behavior normal, pleasant   Medicare Attestation I have personally reviewed: The patient's medical and social history Their use of alcohol, tobacco or illicit drugs Their current medications and supplements The patient's functional ability including ADLs,fall risks, home safety risks, cognitive, and hearing and visual impairment Diet and physical activities Evidence for depression or mood disorders  The patient's weight, height, BMI, and visual acuity have  been recorded in the chart.  I have made referrals, counseling, and provided education to the patient based on review of the above and I have provided the patient with a written personalized care plan for preventive services.     Magda Bernheim, NP   01/24/2021

## 2021-01-26 ENCOUNTER — Ambulatory Visit: Payer: Medicare Other | Admitting: Nurse Practitioner

## 2021-01-29 ENCOUNTER — Other Ambulatory Visit: Payer: Self-pay

## 2021-01-29 ENCOUNTER — Encounter: Payer: Self-pay | Admitting: Adult Health

## 2021-01-29 ENCOUNTER — Ambulatory Visit (INDEPENDENT_AMBULATORY_CARE_PROVIDER_SITE_OTHER): Payer: Medicare Other | Admitting: Adult Health

## 2021-01-29 VITALS — BP 140/70 | HR 72 | Temp 97.9°F | Wt 180.0 lb

## 2021-01-29 DIAGNOSIS — E161 Other hypoglycemia: Secondary | ICD-10-CM | POA: Diagnosis not present

## 2021-01-29 DIAGNOSIS — M549 Dorsalgia, unspecified: Secondary | ICD-10-CM

## 2021-01-29 DIAGNOSIS — C642 Malignant neoplasm of left kidney, except renal pelvis: Secondary | ICD-10-CM | POA: Diagnosis not present

## 2021-01-29 NOTE — Patient Instructions (Addendum)
Try heat/ice Avoid bending Tylenol [phone] mg three times a day Try topicals - voltaren or aspercreme  Lidocain patch Head= muscle Ice = inflammation    Acute Back Pain, Adult Acute back pain is sudden and usually short-lived. It is often caused by an injury to the muscles and tissues in the back. The injury may result from: A muscle or ligament getting overstretched or torn (strained). Ligaments are tissues that connect bones to each other. Lifting something improperly can cause a back strain. Wear and tear (degeneration) of the spinal disks. Spinal disks are circular tissue that provide cushioning between the bones of the spine (vertebrae). Twisting motions, such as while playing sports or doing yard work. A hit to the back. Arthritis. You may have a physical exam, lab tests, and imaging tests to find the cause ofyour pain. Acute back pain usually goes away with rest and home care. Follow these instructions at home: Managing pain, stiffness, and swelling Treatment may include medicines for pain and inflammation that are taken by mouth or applied to the skin, prescription pain medicine, or muscle relaxants. Take over-the-counter and prescription medicines only as told by your health care provider. Your health care provider may recommend applying ice during the first 24-48 hours after your pain starts. To do this: Put ice in a plastic bag. Place a towel between your skin and the bag. Leave the ice on for 20 minutes, 2-3 times a day. If directed, apply heat to the affected area as often as told by your health care provider. Use the heat source that your health care provider recommends, such as a moist heat pack or a heating pad. Place a towel between your skin and the heat source. Leave the heat on for 20-30 minutes. Remove the heat if your skin turns bright red. This is especially important if you are unable to feel pain, heat, or cold. You have a greater risk of getting  burned. Activity  Do not stay in bed. Staying in bed for more than 1-2 days can delay your recovery. Sit up and stand up straight. Avoid leaning forward when you sit or hunching over when you stand. If you work at a desk, sit close to it so you do not need to lean over. Keep your chin tucked in. Keep your neck drawn back, and keep your elbows bent at a 90-degree angle (right angle). Sit high and close to the steering wheel when you drive. Add lower back (lumbar) support to your car seat, if needed. Take short walks on even surfaces as soon as you are able. Try to increase the length of time you walk each day. Do not sit, drive, or stand in one place for more than 30 minutes at a time. Sitting or standing for long periods of time can put stress on your back. Do not drive or use heavy machinery while taking prescription pain medicine. Use proper lifting techniques. When you bend and lift, use positions that put less stress on your back: [place] your knees. Keep the load close to your body. Avoid twisting. Exercise regularly as told by your health care provider. Exercising helps your back heal faster and helps prevent back injuries by keeping muscles strong and flexible. Work with a physical therapist to make a safe exercise program, as recommended by your health care provider. Do any exercises as told by your physical therapist.  Lifestyle Maintain a healthy weight. Extra weight puts stress on your back and makes it difficult to have good  posture. Avoid activities or situations that make you feel anxious or stressed. Stress and anxiety increase muscle tension and can make back pain worse. Learn ways to manage anxiety and stress, such as through exercise. General instructions Sleep on a firm mattress in a comfortable position. Try lying on your side with your knees slightly bent. If you lie on your back, put a pillow under your knees. Follow your treatment plan as told by your health care provider.  This may include: Cognitive or behavioral therapy. Acupuncture or massage therapy. Meditation or yoga. Contact a health care provider if: You have pain that is not relieved with rest or medicine. You have increasing pain going down into your legs or buttocks. Your pain does not improve after 2 weeks. You have pain at night. You lose weight without trying. You have a fever or chills. Get help right away if: You develop new bowel or bladder control problems. You have unusual weakness or numbness in your arms or legs. You develop nausea or vomiting. You develop abdominal pain. You feel faint. Summary Acute back pain is sudden and usually short-lived. Use proper lifting techniques. When you bend and lift, use positions that put less stress on your back. Take over-the-counter and prescription medicines and apply heat or ice as directed by your health care provider. This information is not intended to replace advice given to you by your health care provider. Make sure you discuss any questions you have with your healthcare provider. Document Revised: 03/14/2020 Document Reviewed: 03/17/2020 Elsevier Patient Education  2022 [employer].

## 2021-01-29 NOTE — Progress Notes (Signed)
Assessment and Plan:  Unknown was seen today for back pain.  Diagnoses and all orders for this visit:  Upper back pain on left side Has had recent unremarkable imaging of lung/spine, benign exam with pain not elicited at this time however trigger is movement, do suspect MSK etiology, ? Mild muscle strain/spasm or intermittent radicular  Alternately ? If r/t newly dx kidney cancer - has seen specialist Can try ice/heat - see is any benefit and which helps more Avoid exacerbating activities - bending, lifting Can take tylenol [phone] mg TID, discussed this is safe for kidneys, can use topicals and salon pas if needed If not better or new/progressive sx any midline pain or rash follow up in office.   Cancer of left kidney [facility]) Planning for monitoring only, continue follow up with nephrology/urology  Nocturnal hypoglycemia In diabetic however off of all meds, persistent episodes of nocturnal hypoglycemia, was recommended endocrinology referral by nephrology. ? Need to r/o insulinoma, will defer to endocrinology after discussion with patient and family. Dr. Lissa Merlin if able to see quickly per their preference.  -     Ambulatory referral to Endocrinology   Further disposition pending results of labs. Discussed med's effects and SE's.   Over 30 minutes of exam, counseling, chart review, and critical decision making was performed.   Future Appointments  Date Time Provider [place]  10/09/2021  9:00 AM Liane Comber, NP [facility] None    ------------------------------------------------------------------------------------------------------------------   HPI BP 140/70   Pulse 72   Temp 97.9 F (36.6 C)   Wt 180 lb (81.6 kg)   SpO2 98%   BMI 23.11 kg/m  Isaiah Jackson presents for evaluation of intermittent left upper back pain x 1 month. He denies injury prior to onset. He was recently dx with left kidney mass per CT 01/15/2021, has seen urology Dr. Lovena Neighbours and daughter reports he  was advised slow growing, planning for monitoring only due to age. He also had CXR 01/14/2021 (after onset of pain) that was benign.   He reports pain began gradually, is intermittent, generalized, burning. He reports this is only after he is up and moving around for some time, typically after he bends over to pick something up. Lying down to rest will resolve. Tylenol will also resolve, but has been avoiding "because of my kidneys." He denies rash, radicular sx, CP, dyspnea, cough, fever/chills, nausea, fatigue. Denies pain at time of exam.   Daughter is requesting referral to endocrinology, Dr. Lissa Merlin if possible. Patient is long standing diabetic, recently had admission for hypoglycemia (42) while on jardiance. This med has been stopped. Patient continues to have overnight hypoglycemia that is unexplained and reports was advised by nephrology to request endocrinology referral. Reviewed lifestyle/meds, no current diabetic agents, reports good appetite and eats normally during the day, goes to bed around 8 and will wake up in AM with glucose <70 persistently this last week.   Lab Results  Component Value Date   HGBA1C 7.3 (H) 10/09/2020    Lab Results  Component Value Date   GFRNONAA 23 (L) 01/14/2021   GFRNONAA 20 (L) 12/06/2020   GFRNONAA 20 (L) 11/22/2020     Past Medical History:  Diagnosis Date   Benign prostatic hypertrophy    history of post catheterization   COPD (chronic obstructive pulmonary disease) (HCC)    Coronary artery disease    s/p PTA and stenting   Diabetes mellitus    Hyperlipidemia    Hypertension    Kidney stone  Myocardial infarction [facility])    Nephrolithiasis    history of cystoscopy   Vertigo 07/02/2017   Vitamin D deficiency      Allergies  Allergen Reactions   Bee Venom    Capoten [Captopril]     Fatigue/depression   Lamisil [Terbinafine] Rash    Current Outpatient Medications on File Prior to Visit  Medication Sig   amLODipine (NORVASC) 5 MG  tablet Take 2 tablets (10 mg total) by mouth at bedtime. Take 1 tablet in the evening for BP   Blood Glucose Monitoring Suppl DEVI Please provide glucometer per insurance coverage.  Insurance no longer covers supplies for current Freestyle Freedom lite.  Check blood glucose three times a day and as needed.   clopidogrel (PLAVIX) 75 MG tablet Take  1 tablet  Daily  to Prevent Strokes (Patient taking differently: Take 75 mg by mouth daily.)   fenofibrate micronized (LOFIBRA) 134 MG capsule Take 1 capsule Daily for Triglycerides (Blood Fats) (Patient taking differently: Take 134 mg by mouth daily.)   glucose blood (FREESTYLE LITE) test strip USE 1 STRIP TO CHECK GLUCOSE ONCE DAILY. Dx: E11.29   glucose blood test strip Use as instructed.  Check blood glucose three times a day and as needed.   olmesartan (BENICAR) 40 MG tablet Take  1/2 tablet (20 mg)   2 x /day - Am & Pm for BP (Patient taking differently: Take 20 mg by mouth in the morning and at bedtime.)   simvastatin (ZOCOR) 40 MG tablet Take 1 tablet at Bedtime for Cholesterol (Patient taking differently: Take 40 mg by mouth at bedtime.)   JARDIANCE 10 MG TABS tablet Take 10 mg by mouth every evening. (Patient not taking: Reported on 01/29/2021)   No current facility-administered medications on file prior to visit.    ROS: all negative except above.   Physical Exam:  BP 140/70   Pulse 72   Temp 97.9 F (36.6 C)   Wt 180 lb (81.6 kg)   SpO2 98%   BMI 23.11 kg/m   General Appearance: Well nourished elder male, in no apparent distress. Eyes: R eye with patch, left clear without redness/discharge, round and reactive ENT/Mouth: Mask in place; very HOH.  Neck: Supple Respiratory: Respiratory effort normal, BS equal bilaterally without rales, rhonchi, wheezing or stridor.  Cardio: RRR with no MRGs. Symmetrical intact peripheral pulses without edema.  Abdomen: Soft, + BS.  Non tender, no guarding, rebound. Lymphatics: Non tender without  lymphadenopathy.  Musculoskeletal: Slow steady gait, symmetrical upper extremity ROM, no spinous tenderness or tenderness along chest wall to palpation. No bony abnormality. No muscle spasm.  Skin: Warm, dry without rashes, lesions, ecchymosis.  Neuro: Normal muscle tone Psych: Awake and oriented X 3, normal affect, Insight and Judgment appropriate.     Izora Ribas, NP 3:42 PM [facility]

## 2021-02-02 ENCOUNTER — Telehealth: Payer: Self-pay | Admitting: Adult Health

## 2021-02-02 DIAGNOSIS — R310 Gross hematuria: Secondary | ICD-10-CM | POA: Diagnosis not present

## 2021-02-02 DIAGNOSIS — D49512 Neoplasm of unspecified behavior of left kidney: Secondary | ICD-10-CM | POA: Diagnosis not present

## 2021-02-02 NOTE — Telephone Encounter (Signed)
Daughter Lonza Shimabukuro called to ask if you thought patient should be on depression medication. I sent her a MY Chart link for patient, she will activate.

## 2021-02-03 ENCOUNTER — Inpatient Hospital Stay (HOSPITAL_COMMUNITY): Payer: Medicare Other

## 2021-02-03 ENCOUNTER — Emergency Department (HOSPITAL_COMMUNITY): Payer: Medicare Other | Admitting: Anesthesiology

## 2021-02-03 ENCOUNTER — Emergency Department (HOSPITAL_COMMUNITY): Payer: Medicare Other

## 2021-02-03 ENCOUNTER — Inpatient Hospital Stay (HOSPITAL_COMMUNITY): Payer: Medicare Other | Admitting: Anesthesiology

## 2021-02-03 ENCOUNTER — Other Ambulatory Visit: Payer: Self-pay

## 2021-02-03 ENCOUNTER — Encounter (HOSPITAL_COMMUNITY): Admission: EM | Disposition: E | Payer: Self-pay | Source: Home / Self Care | Attending: Family Medicine

## 2021-02-03 ENCOUNTER — Inpatient Hospital Stay (HOSPITAL_COMMUNITY)
Admission: EM | Admit: 2021-02-03 | Discharge: 2021-03-08 | DRG: 326 | Disposition: E | Payer: Medicare Other | Attending: Family Medicine | Admitting: Family Medicine

## 2021-02-03 ENCOUNTER — Encounter (HOSPITAL_COMMUNITY): Payer: Self-pay | Admitting: Emergency Medicine

## 2021-02-03 DIAGNOSIS — J189 Pneumonia, unspecified organism: Secondary | ICD-10-CM

## 2021-02-03 DIAGNOSIS — Z7189 Other specified counseling: Secondary | ICD-10-CM | POA: Diagnosis not present

## 2021-02-03 DIAGNOSIS — N189 Chronic kidney disease, unspecified: Secondary | ICD-10-CM | POA: Diagnosis not present

## 2021-02-03 DIAGNOSIS — Z515 Encounter for palliative care: Secondary | ICD-10-CM | POA: Diagnosis not present

## 2021-02-03 DIAGNOSIS — E872 Acidosis: Secondary | ICD-10-CM | POA: Diagnosis not present

## 2021-02-03 DIAGNOSIS — G9340 Encephalopathy, unspecified: Secondary | ICD-10-CM | POA: Diagnosis not present

## 2021-02-03 DIAGNOSIS — E1151 Type 2 diabetes mellitus with diabetic peripheral angiopathy without gangrene: Secondary | ICD-10-CM | POA: Diagnosis present

## 2021-02-03 DIAGNOSIS — R110 Nausea: Secondary | ICD-10-CM | POA: Diagnosis not present

## 2021-02-03 DIAGNOSIS — E861 Hypovolemia: Secondary | ICD-10-CM | POA: Diagnosis present

## 2021-02-03 DIAGNOSIS — I4891 Unspecified atrial fibrillation: Secondary | ICD-10-CM | POA: Diagnosis not present

## 2021-02-03 DIAGNOSIS — E1165 Type 2 diabetes mellitus with hyperglycemia: Secondary | ICD-10-CM | POA: Diagnosis present

## 2021-02-03 DIAGNOSIS — Z8249 Family history of ischemic heart disease and other diseases of the circulatory system: Secondary | ICD-10-CM

## 2021-02-03 DIAGNOSIS — C649 Malignant neoplasm of unspecified kidney, except renal pelvis: Secondary | ICD-10-CM | POA: Diagnosis not present

## 2021-02-03 DIAGNOSIS — J95821 Acute postprocedural respiratory failure: Secondary | ICD-10-CM | POA: Diagnosis not present

## 2021-02-03 DIAGNOSIS — R0902 Hypoxemia: Secondary | ICD-10-CM | POA: Diagnosis not present

## 2021-02-03 DIAGNOSIS — R0602 Shortness of breath: Secondary | ICD-10-CM

## 2021-02-03 DIAGNOSIS — I517 Cardiomegaly: Secondary | ICD-10-CM | POA: Diagnosis not present

## 2021-02-03 DIAGNOSIS — Z87891 Personal history of nicotine dependence: Secondary | ICD-10-CM | POA: Diagnosis not present

## 2021-02-03 DIAGNOSIS — I10 Essential (primary) hypertension: Secondary | ICD-10-CM | POA: Diagnosis present

## 2021-02-03 DIAGNOSIS — Z9889 Other specified postprocedural states: Secondary | ICD-10-CM

## 2021-02-03 DIAGNOSIS — N184 Chronic kidney disease, stage 4 (severe): Secondary | ICD-10-CM | POA: Diagnosis present

## 2021-02-03 DIAGNOSIS — K251 Acute gastric ulcer with perforation: Secondary | ICD-10-CM | POA: Diagnosis not present

## 2021-02-03 DIAGNOSIS — R739 Hyperglycemia, unspecified: Secondary | ICD-10-CM | POA: Diagnosis not present

## 2021-02-03 DIAGNOSIS — J9601 Acute respiratory failure with hypoxia: Secondary | ICD-10-CM | POA: Diagnosis not present

## 2021-02-03 DIAGNOSIS — I468 Cardiac arrest due to other underlying condition: Secondary | ICD-10-CM | POA: Diagnosis not present

## 2021-02-03 DIAGNOSIS — J449 Chronic obstructive pulmonary disease, unspecified: Secondary | ICD-10-CM | POA: Diagnosis present

## 2021-02-03 DIAGNOSIS — R54 Age-related physical debility: Secondary | ICD-10-CM | POA: Diagnosis present

## 2021-02-03 DIAGNOSIS — Z789 Other specified health status: Secondary | ICD-10-CM

## 2021-02-03 DIAGNOSIS — I252 Old myocardial infarction: Secondary | ICD-10-CM

## 2021-02-03 DIAGNOSIS — N179 Acute kidney failure, unspecified: Secondary | ICD-10-CM | POA: Diagnosis not present

## 2021-02-03 DIAGNOSIS — E860 Dehydration: Secondary | ICD-10-CM | POA: Diagnosis present

## 2021-02-03 DIAGNOSIS — E871 Hypo-osmolality and hyponatremia: Secondary | ICD-10-CM | POA: Diagnosis present

## 2021-02-03 DIAGNOSIS — I129 Hypertensive chronic kidney disease with stage 1 through stage 4 chronic kidney disease, or unspecified chronic kidney disease: Secondary | ICD-10-CM | POA: Diagnosis present

## 2021-02-03 DIAGNOSIS — Z66 Do not resuscitate: Secondary | ICD-10-CM | POA: Diagnosis present

## 2021-02-03 DIAGNOSIS — Z955 Presence of coronary angioplasty implant and graft: Secondary | ICD-10-CM

## 2021-02-03 DIAGNOSIS — I509 Heart failure, unspecified: Secondary | ICD-10-CM | POA: Diagnosis not present

## 2021-02-03 DIAGNOSIS — F05 Delirium due to known physiological condition: Secondary | ICD-10-CM | POA: Diagnosis not present

## 2021-02-03 DIAGNOSIS — Z833 Family history of diabetes mellitus: Secondary | ICD-10-CM

## 2021-02-03 DIAGNOSIS — I110 Hypertensive heart disease with heart failure: Secondary | ICD-10-CM | POA: Diagnosis present

## 2021-02-03 DIAGNOSIS — Z8673 Personal history of transient ischemic attack (TIA), and cerebral infarction without residual deficits: Secondary | ICD-10-CM

## 2021-02-03 DIAGNOSIS — K631 Perforation of intestine (nontraumatic): Secondary | ICD-10-CM | POA: Diagnosis not present

## 2021-02-03 DIAGNOSIS — D751 Secondary polycythemia: Secondary | ICD-10-CM | POA: Diagnosis present

## 2021-02-03 DIAGNOSIS — K650 Generalized (acute) peritonitis: Secondary | ICD-10-CM | POA: Diagnosis present

## 2021-02-03 DIAGNOSIS — K5720 Diverticulitis of large intestine with perforation and abscess without bleeding: Secondary | ICD-10-CM | POA: Diagnosis present

## 2021-02-03 DIAGNOSIS — R531 Weakness: Secondary | ICD-10-CM | POA: Diagnosis not present

## 2021-02-03 DIAGNOSIS — J90 Pleural effusion, not elsewhere classified: Secondary | ICD-10-CM | POA: Diagnosis not present

## 2021-02-03 DIAGNOSIS — K8020 Calculus of gallbladder without cholecystitis without obstruction: Secondary | ICD-10-CM | POA: Diagnosis not present

## 2021-02-03 DIAGNOSIS — N400 Enlarged prostate without lower urinary tract symptoms: Secondary | ICD-10-CM | POA: Diagnosis present

## 2021-02-03 DIAGNOSIS — Z91030 Bee allergy status: Secondary | ICD-10-CM

## 2021-02-03 DIAGNOSIS — Z7984 Long term (current) use of oral hypoglycemic drugs: Secondary | ICD-10-CM

## 2021-02-03 DIAGNOSIS — K255 Chronic or unspecified gastric ulcer with perforation: Principal | ICD-10-CM | POA: Diagnosis present

## 2021-02-03 DIAGNOSIS — R109 Unspecified abdominal pain: Secondary | ICD-10-CM | POA: Diagnosis not present

## 2021-02-03 DIAGNOSIS — Z888 Allergy status to other drugs, medicaments and biological substances status: Secondary | ICD-10-CM

## 2021-02-03 DIAGNOSIS — E611 Iron deficiency: Secondary | ICD-10-CM | POA: Diagnosis present

## 2021-02-03 DIAGNOSIS — Z20822 Contact with and (suspected) exposure to covid-19: Secondary | ICD-10-CM | POA: Diagnosis present

## 2021-02-03 DIAGNOSIS — N136 Pyonephrosis: Secondary | ICD-10-CM | POA: Diagnosis present

## 2021-02-03 DIAGNOSIS — Z7902 Long term (current) use of antithrombotics/antiplatelets: Secondary | ICD-10-CM

## 2021-02-03 DIAGNOSIS — R1111 Vomiting without nausea: Secondary | ICD-10-CM | POA: Diagnosis not present

## 2021-02-03 DIAGNOSIS — N433 Hydrocele, unspecified: Secondary | ICD-10-CM | POA: Diagnosis not present

## 2021-02-03 DIAGNOSIS — E1122 Type 2 diabetes mellitus with diabetic chronic kidney disease: Secondary | ICD-10-CM | POA: Diagnosis present

## 2021-02-03 DIAGNOSIS — E785 Hyperlipidemia, unspecified: Secondary | ICD-10-CM | POA: Diagnosis present

## 2021-02-03 DIAGNOSIS — I2510 Atherosclerotic heart disease of native coronary artery without angina pectoris: Secondary | ICD-10-CM | POA: Diagnosis present

## 2021-02-03 DIAGNOSIS — Z79899 Other long term (current) drug therapy: Secondary | ICD-10-CM

## 2021-02-03 DIAGNOSIS — K5730 Diverticulosis of large intestine without perforation or abscess without bleeding: Secondary | ICD-10-CM | POA: Diagnosis not present

## 2021-02-03 HISTORY — PX: LAPAROTOMY: SHX154

## 2021-02-03 HISTORY — PX: REPAIR OF PERFORATED ULCER: SHX6065

## 2021-02-03 HISTORY — DX: Cerebral infarction, unspecified: I63.9

## 2021-02-03 LAB — BLOOD GAS, ARTERIAL
Acid-base deficit: 6.3 mmol/L — ABNORMAL HIGH (ref 0.0–2.0)
Bicarbonate: 20.2 mmol/L (ref 20.0–28.0)
Drawn by: 252031
FIO2: 40
O2 Saturation: 97.3 %
Patient temperature: 37
pCO2 arterial: 51.5 mmHg — ABNORMAL HIGH (ref 32.0–48.0)
pH, Arterial: 7.217 — ABNORMAL LOW (ref 7.350–7.450)
pO2, Arterial: 119 mmHg — ABNORMAL HIGH (ref 83.0–108.0)

## 2021-02-03 LAB — CBC WITH DIFFERENTIAL/PLATELET
Abs Immature Granulocytes: 0.11 10*3/uL — ABNORMAL HIGH (ref 0.00–0.07)
Basophils Absolute: 0 10*3/uL (ref 0.0–0.1)
Basophils Relative: 0 %
Eosinophils Absolute: 0 10*3/uL (ref 0.0–0.5)
Eosinophils Relative: 0 %
HCT: 57 % — ABNORMAL HIGH (ref 39.0–52.0)
Hemoglobin: 18.3 g/dL — ABNORMAL HIGH (ref 13.0–17.0)
Immature Granulocytes: 1 %
Lymphocytes Relative: 4 %
Lymphs Abs: 0.5 10*3/uL — ABNORMAL LOW (ref 0.7–4.0)
MCH: 25.1 pg — ABNORMAL LOW (ref 26.0–34.0)
MCHC: 32.1 g/dL (ref 30.0–36.0)
MCV: 78.3 fL — ABNORMAL LOW (ref 80.0–100.0)
Monocytes Absolute: 0.9 10*3/uL (ref 0.1–1.0)
Monocytes Relative: 6 %
Neutro Abs: 12.6 10*3/uL — ABNORMAL HIGH (ref 1.7–7.7)
Neutrophils Relative %: 89 %
Platelets: 319 10*3/uL (ref 150–400)
RBC: 7.28 MIL/uL — ABNORMAL HIGH (ref 4.22–5.81)
RDW: 20 % — ABNORMAL HIGH (ref 11.5–15.5)
WBC: 14.1 10*3/uL — ABNORMAL HIGH (ref 4.0–10.5)
nRBC: 0 % (ref 0.0–0.2)

## 2021-02-03 LAB — URINALYSIS, ROUTINE W REFLEX MICROSCOPIC
Bilirubin Urine: NEGATIVE
Glucose, UA: 50 mg/dL — AB
Ketones, ur: NEGATIVE mg/dL
Leukocytes,Ua: NEGATIVE
Nitrite: NEGATIVE
Protein, ur: NEGATIVE mg/dL
Specific Gravity, Urine: 1.001 — ABNORMAL LOW (ref 1.005–1.030)
pH: 6 (ref 5.0–8.0)

## 2021-02-03 LAB — CBC
HCT: 65.2 % — ABNORMAL HIGH (ref 39.0–52.0)
Hemoglobin: 20.4 g/dL — ABNORMAL HIGH (ref 13.0–17.0)
MCH: 24.6 pg — ABNORMAL LOW (ref 26.0–34.0)
MCHC: 31.3 g/dL (ref 30.0–36.0)
MCV: 78.6 fL — ABNORMAL LOW (ref 80.0–100.0)
Platelets: 385 10*3/uL (ref 150–400)
RBC: 8.3 MIL/uL — ABNORMAL HIGH (ref 4.22–5.81)
RDW: 20.9 % — ABNORMAL HIGH (ref 11.5–15.5)
WBC: 15.4 10*3/uL — ABNORMAL HIGH (ref 4.0–10.5)
nRBC: 0 % (ref 0.0–0.2)

## 2021-02-03 LAB — COMPREHENSIVE METABOLIC PANEL
ALT: 14 U/L (ref 0–44)
AST: 27 U/L (ref 15–41)
Albumin: 3.3 g/dL — ABNORMAL LOW (ref 3.5–5.0)
Alkaline Phosphatase: 52 U/L (ref 38–126)
Anion gap: 16 — ABNORMAL HIGH (ref 5–15)
BUN: 53 mg/dL — ABNORMAL HIGH (ref 8–23)
CO2: 18 mmol/L — ABNORMAL LOW (ref 22–32)
Calcium: 10.7 mg/dL — ABNORMAL HIGH (ref 8.9–10.3)
Chloride: 89 mmol/L — ABNORMAL LOW (ref 98–111)
Creatinine, Ser: 3.64 mg/dL — ABNORMAL HIGH (ref 0.61–1.24)
GFR, Estimated: 16 mL/min — ABNORMAL LOW (ref >60)
Glucose, Bld: 311 mg/dL — ABNORMAL HIGH (ref 70–99)
Potassium: 5.1 mmol/L (ref 3.5–5.1)
Sodium: 123 mmol/L — ABNORMAL LOW (ref 135–145)
Total Bilirubin: 1.9 mg/dL — ABNORMAL HIGH (ref 0.3–1.2)
Total Protein: 7.5 g/dL (ref 6.5–8.1)

## 2021-02-03 LAB — BASIC METABOLIC PANEL
Anion gap: 14 (ref 5–15)
BUN: 50 mg/dL — ABNORMAL HIGH (ref 8–23)
CO2: 17 mmol/L — ABNORMAL LOW (ref 22–32)
Calcium: 9.5 mg/dL (ref 8.9–10.3)
Chloride: 98 mmol/L (ref 98–111)
Creatinine, Ser: 2.75 mg/dL — ABNORMAL HIGH (ref 0.61–1.24)
GFR, Estimated: 22 mL/min — ABNORMAL LOW (ref >60)
Glucose, Bld: 262 mg/dL — ABNORMAL HIGH (ref 70–99)
Potassium: 5.5 mmol/L — ABNORMAL HIGH (ref 3.5–5.1)
Sodium: 129 mmol/L — ABNORMAL LOW (ref 135–145)

## 2021-02-03 LAB — LIPASE, BLOOD: Lipase: 54 U/L — ABNORMAL HIGH (ref 11–51)

## 2021-02-03 LAB — GLUCOSE, CAPILLARY
Glucose-Capillary: 124 mg/dL — ABNORMAL HIGH (ref 70–99)
Glucose-Capillary: 129 mg/dL — ABNORMAL HIGH (ref 70–99)
Glucose-Capillary: 176 mg/dL — ABNORMAL HIGH (ref 70–99)
Glucose-Capillary: 200 mg/dL — ABNORMAL HIGH (ref 70–99)
Glucose-Capillary: 237 mg/dL — ABNORMAL HIGH (ref 70–99)
Glucose-Capillary: 245 mg/dL — ABNORMAL HIGH (ref 70–99)
Glucose-Capillary: 95 mg/dL (ref 70–99)

## 2021-02-03 LAB — RESP PANEL BY RT-PCR (FLU A&B, COVID) ARPGX2
Influenza A by PCR: NEGATIVE
Influenza B by PCR: NEGATIVE
SARS Coronavirus 2 by RT PCR: NEGATIVE

## 2021-02-03 SURGERY — LAPAROTOMY, EXPLORATORY
Anesthesia: General | Site: Abdomen

## 2021-02-03 MED ORDER — DEXMEDETOMIDINE HCL IN NACL 400 MCG/100ML IV SOLN
0.0000 ug/kg/h | INTRAVENOUS | Status: DC
  Administered 2021-02-04: 0.8 ug/kg/h via INTRAVENOUS
  Filled 2021-02-03: qty 100

## 2021-02-03 MED ORDER — SUCCINYLCHOLINE CHLORIDE 200 MG/10ML IV SOSY
PREFILLED_SYRINGE | INTRAVENOUS | Status: DC | PRN
  Administered 2021-02-03: 160 mg via INTRAVENOUS

## 2021-02-03 MED ORDER — SODIUM CHLORIDE 0.9 % IV SOLN: INTRAVENOUS | Status: DC

## 2021-02-03 MED ORDER — SIMVASTATIN 20 MG PO TABS
40.0000 mg | ORAL_TABLET | Freq: Every day | ORAL | Status: DC
  Administered 2021-02-04: 40 mg
  Filled 2021-02-03: qty 2

## 2021-02-03 MED ORDER — INSULIN ASPART 100 UNIT/ML IJ SOLN
0.0000 [IU] | Freq: Three times a day (TID) | INTRAMUSCULAR | Status: DC
  Administered 2021-02-04: 1 [IU] via SUBCUTANEOUS

## 2021-02-03 MED ORDER — ALBUMIN HUMAN 5 % IV SOLN
INTRAVENOUS | Status: DC | PRN
Start: 2021-02-03 — End: 2021-02-03

## 2021-02-03 MED ORDER — ONDANSETRON HCL 4 MG/2ML IJ SOLN
4.0000 mg | Freq: Once | INTRAMUSCULAR | Status: AC
  Administered 2021-02-03: 4 mg via INTRAVENOUS
  Filled 2021-02-03: qty 2

## 2021-02-03 MED ORDER — DEXMEDETOMIDINE HCL IN NACL 80 MCG/20ML IV SOLN
INTRAVENOUS | Status: AC
  Filled 2021-02-03: qty 20

## 2021-02-03 MED ORDER — ACETAMINOPHEN 500 MG PO TABS: 1000.0000 mg | ORAL_TABLET | Freq: Once | ORAL | Status: DC | PRN

## 2021-02-03 MED ORDER — ACETAMINOPHEN 650 MG RE SUPP: 650.0000 mg | Freq: Four times a day (QID) | RECTAL | Status: DC | PRN

## 2021-02-03 MED ORDER — FENOFIBRATE 160 MG PO TABS
160.0000 mg | ORAL_TABLET | Freq: Every day | ORAL | Status: DC
  Administered 2021-02-04: 160 mg
  Filled 2021-02-03 (×2): qty 1

## 2021-02-03 MED ORDER — FENTANYL CITRATE (PF) 100 MCG/2ML IJ SOLN
INTRAMUSCULAR | Status: DC | PRN
  Administered 2021-02-03: 25 ug via INTRAVENOUS
  Administered 2021-02-03: 100 ug via INTRAVENOUS
  Administered 2021-02-03: 25 ug via INTRAVENOUS

## 2021-02-03 MED ORDER — PANTOPRAZOLE SODIUM 40 MG IV SOLR: 40.0000 mg | Freq: Every day | INTRAVENOUS | Status: DC

## 2021-02-03 MED ORDER — FENTANYL CITRATE PF 50 MCG/ML IJ SOSY: 25.0000 ug | PREFILLED_SYRINGE | INTRAMUSCULAR | Status: DC | PRN

## 2021-02-03 MED ORDER — PANTOPRAZOLE SODIUM 40 MG IV SOLR: 40.0000 mg | Freq: Two times a day (BID) | INTRAVENOUS | Status: DC

## 2021-02-03 MED ORDER — HEPARIN SODIUM (PORCINE) 5000 UNIT/ML IJ SOLN
5000.0000 [IU] | Freq: Two times a day (BID) | INTRAMUSCULAR | Status: DC
  Administered 2021-02-04 – 2021-02-07 (×8): 5000 [IU] via SUBCUTANEOUS
  Filled 2021-02-03 (×8): qty 1

## 2021-02-03 MED ORDER — HYDRALAZINE HCL 20 MG/ML IJ SOLN
5.0000 mg | Freq: Four times a day (QID) | INTRAMUSCULAR | Status: DC | PRN
  Administered 2021-02-07: 5 mg via INTRAVENOUS
  Filled 2021-02-03: qty 1

## 2021-02-03 MED ORDER — PHENYLEPHRINE HCL (PRESSORS) 10 MG/ML IV SOLN
INTRAVENOUS | Status: DC | PRN
  Administered 2021-02-03: 80 ug via INTRAVENOUS

## 2021-02-03 MED ORDER — SUGAMMADEX SODIUM 200 MG/2ML IV SOLN
INTRAVENOUS | Status: DC | PRN
  Administered 2021-02-03: 200 mg via INTRAVENOUS

## 2021-02-03 MED ORDER — ORAL CARE MOUTH RINSE
15.0000 mL | OROMUCOSAL | Status: DC
  Administered 2021-02-04 (×4): 15 mL via OROMUCOSAL

## 2021-02-03 MED ORDER — LIDOCAINE 2% (20 MG/ML) 5 ML SYRINGE
INTRAMUSCULAR | Status: DC | PRN
  Administered 2021-02-03: 60 mg via INTRAVENOUS

## 2021-02-03 MED ORDER — CHLORHEXIDINE GLUCONATE 0.12% ORAL RINSE (MEDLINE KIT)
15.0000 mL | Freq: Two times a day (BID) | OROMUCOSAL | Status: DC
  Administered 2021-02-04 – 2021-02-08 (×10): 15 mL via OROMUCOSAL

## 2021-02-03 MED ORDER — HYDROMORPHONE HCL 1 MG/ML IJ SOLN
0.5000 mg | Freq: Once | INTRAMUSCULAR | Status: AC
Start: 2021-02-03 — End: 2021-02-03
  Administered 2021-02-03: 0.5 mg via INTRAVENOUS
  Filled 2021-02-03: qty 1

## 2021-02-03 MED ORDER — SODIUM CHLORIDE 0.9 % IV SOLN: INTRAVENOUS | Status: AC

## 2021-02-03 MED ORDER — INSULIN REGULAR(HUMAN) IN NACL 100-0.9 UT/100ML-% IV SOLN
INTRAVENOUS | Status: AC
  Administered 2021-02-03: 7.5 [IU]/h via INTRAVENOUS
  Filled 2021-02-03: qty 100

## 2021-02-03 MED ORDER — PIPERACILLIN-TAZOBACTAM IN DEX 2-0.25 GM/50ML IV SOLN
2.2500 g | Freq: Three times a day (TID) | INTRAVENOUS | Status: DC
  Administered 2021-02-03 – 2021-02-04 (×2): 2.25 g via INTRAVENOUS
  Filled 2021-02-03 (×4): qty 50

## 2021-02-03 MED ORDER — DEXMEDETOMIDINE HCL IN NACL 400 MCG/100ML IV SOLN
INTRAVENOUS | Status: AC
  Filled 2021-02-03: qty 100

## 2021-02-03 MED ORDER — FENTANYL CITRATE (PF) 100 MCG/2ML IJ SOLN: 25.0000 ug | INTRAMUSCULAR | Status: DC | PRN

## 2021-02-03 MED ORDER — OXYCODONE HCL 5 MG/5ML PO SOLN
5.0000 mg | Freq: Once | ORAL | Status: DC | PRN
Start: 2021-02-03 — End: 2021-02-03

## 2021-02-03 MED ORDER — PROPOFOL 10 MG/ML IV BOLUS
INTRAVENOUS | Status: DC | PRN
  Administered 2021-02-03: 60 mg via INTRAVENOUS

## 2021-02-03 MED ORDER — ONDANSETRON HCL 4 MG/2ML IJ SOLN: 4.0000 mg | Freq: Four times a day (QID) | INTRAMUSCULAR | Status: DC | PRN

## 2021-02-03 MED ORDER — ACETAMINOPHEN 160 MG/5ML PO SOLN
650.0000 mg | Freq: Four times a day (QID) | ORAL | Status: DC | PRN
  Administered 2021-02-07: 650 mg
  Filled 2021-02-03: qty 20.3

## 2021-02-03 MED ORDER — LABETALOL HCL 5 MG/ML IV SOLN
INTRAVENOUS | Status: DC | PRN
  Administered 2021-02-03: 10 mg via INTRAVENOUS

## 2021-02-03 MED ORDER — SUCCINYLCHOLINE CHLORIDE 200 MG/10ML IV SOSY
PREFILLED_SYRINGE | INTRAVENOUS | Status: DC | PRN
  Administered 2021-02-03: 60 mg via INTRAVENOUS

## 2021-02-03 MED ORDER — FENTANYL CITRATE (PF) 100 MCG/2ML IJ SOLN
50.0000 ug | Freq: Once | INTRAMUSCULAR | Status: AC
  Administered 2021-02-03: 50 ug via INTRAVENOUS
  Filled 2021-02-03: qty 2

## 2021-02-03 MED ORDER — SODIUM CHLORIDE 0.9 % IV BOLUS
1000.0000 mL | Freq: Once | INTRAVENOUS | Status: AC
  Administered 2021-02-03: 1000 mL via INTRAVENOUS

## 2021-02-03 MED ORDER — LACTATED RINGERS IV SOLN: INTRAVENOUS | Status: DC

## 2021-02-03 MED ORDER — PHENYLEPHRINE HCL-NACL 10-0.9 MG/250ML-% IV SOLN
0.0000 ug/min | INTRAVENOUS | Status: DC
  Administered 2021-02-03: 40 ug/min via INTRAVENOUS

## 2021-02-03 MED ORDER — LACTATED RINGERS IV SOLN: INTRAVENOUS | Status: DC | PRN

## 2021-02-03 MED ORDER — POLYETHYLENE GLYCOL 3350 17 G PO PACK
17.0000 g | PACK | Freq: Every day | ORAL | Status: DC
  Administered 2021-02-04: 17 g
  Filled 2021-02-03: qty 1

## 2021-02-03 MED ORDER — DOCUSATE SODIUM 50 MG/5ML PO LIQD
100.0000 mg | Freq: Two times a day (BID) | ORAL | Status: DC
  Administered 2021-02-04 (×2): 100 mg
  Filled 2021-02-03 (×2): qty 10

## 2021-02-03 MED ORDER — DEXMEDETOMIDINE HCL IN NACL 200 MCG/50ML IV SOLN
0.4000 ug/kg/h | INTRAVENOUS | Status: DC
  Administered 2021-02-03: 0.5 ug/kg/h via INTRAVENOUS
  Administered 2021-02-03: 0.7 ug/kg/h via INTRAVENOUS

## 2021-02-03 MED ORDER — PHENYLEPHRINE HCL-NACL 10-0.9 MG/250ML-% IV SOLN
INTRAVENOUS | Status: DC | PRN
  Administered 2021-02-03: 75 ug/min via INTRAVENOUS

## 2021-02-03 MED ORDER — CHLORHEXIDINE GLUCONATE 0.12 % MT SOLN: 15.0000 mL | Freq: Once | OROMUCOSAL | Status: AC

## 2021-02-03 MED ORDER — ONDANSETRON HCL 4 MG/2ML IJ SOLN
INTRAMUSCULAR | Status: DC | PRN
  Administered 2021-02-03: 4 mg via INTRAVENOUS

## 2021-02-03 MED ORDER — ROCURONIUM BROMIDE 10 MG/ML (PF) SYRINGE
PREFILLED_SYRINGE | INTRAVENOUS | Status: DC | PRN
  Administered 2021-02-03: 50 mg via INTRAVENOUS

## 2021-02-03 MED ORDER — DEXTROSE 50 % IV SOLN
INTRAVENOUS | Status: AC
  Administered 2021-02-03: 25 mL
  Filled 2021-02-03: qty 50

## 2021-02-03 MED ORDER — OXYCODONE HCL 5 MG PO TABS: 5.0000 mg | ORAL_TABLET | Freq: Once | ORAL | Status: DC | PRN

## 2021-02-03 MED ORDER — CLOPIDOGREL BISULFATE 75 MG PO TABS: 75.0000 mg | ORAL_TABLET | Freq: Every day | ORAL | Status: DC

## 2021-02-03 MED ORDER — CHLORHEXIDINE GLUCONATE 0.12 % MT SOLN
OROMUCOSAL | Status: AC
  Administered 2021-02-03: 15 mL via OROMUCOSAL
  Filled 2021-02-03: qty 15

## 2021-02-03 MED ORDER — FENTANYL CITRATE (PF) 250 MCG/5ML IJ SOLN
INTRAMUSCULAR | Status: AC
  Filled 2021-02-03: qty 5

## 2021-02-03 MED ORDER — ACETAMINOPHEN 160 MG/5ML PO SOLN: 1000.0000 mg | Freq: Once | ORAL | Status: DC | PRN

## 2021-02-03 MED ORDER — HYDROMORPHONE HCL 1 MG/ML IJ SOLN: 0.5000 mg | INTRAMUSCULAR | Status: DC | PRN

## 2021-02-03 MED ORDER — ACETAMINOPHEN 10 MG/ML IV SOLN: 1000.0000 mg | Freq: Once | INTRAVENOUS | Status: DC | PRN

## 2021-02-03 MED ORDER — ORAL CARE MOUTH RINSE: 15.0000 mL | Freq: Once | OROMUCOSAL | Status: AC

## 2021-02-03 MED ORDER — 0.9 % SODIUM CHLORIDE (POUR BTL) OPTIME
TOPICAL | Status: DC | PRN
  Administered 2021-02-03: 1000 mL
  Administered 2021-02-03: 3000 mL

## 2021-02-03 SURGICAL SUPPLY — 44 items
BAG COUNTER SPONGE SURGICOUNT (BAG) ×3 IMPLANT
BLADE CLIPPER SURG (BLADE) ×3 IMPLANT
CANISTER SUCT 3000ML PPV (MISCELLANEOUS) ×3 IMPLANT
CHLORAPREP W/TINT 26 (MISCELLANEOUS) ×3 IMPLANT
COVER SURGICAL LIGHT HANDLE (MISCELLANEOUS) ×3 IMPLANT
DRAIN CHANNEL 19F RND (DRAIN) ×3 IMPLANT
DRAPE LAPAROSCOPIC ABDOMINAL (DRAPES) ×3 IMPLANT
DRAPE WARM FLUID 44X44 (DRAPES) ×3 IMPLANT
DRSG OPSITE POSTOP 4X10 (GAUZE/BANDAGES/DRESSINGS) IMPLANT
DRSG OPSITE POSTOP 4X8 (GAUZE/BANDAGES/DRESSINGS) IMPLANT
ELECT BLADE 6.5 EXT (BLADE) ×3 IMPLANT
ELECT CAUTERY BLADE 6.4 (BLADE) ×3 IMPLANT
ELECT REM PT RETURN 9FT ADLT (ELECTROSURGICAL) ×3
ELECTRODE REM PT RTRN 9FT ADLT (ELECTROSURGICAL) ×2 IMPLANT
EVACUATOR SILICONE 100CC (DRAIN) ×3 IMPLANT
GAUZE SPONGE 4X4 12PLY STRL (GAUZE/BANDAGES/DRESSINGS) ×3 IMPLANT
GLOVE SRG 8 PF TXTR STRL LF DI (GLOVE) ×2 IMPLANT
GLOVE SURG ENC MOIS LTX SZ8 (GLOVE) ×3 IMPLANT
GLOVE SURG UNDER POLY LF SZ8 (GLOVE) ×3
GOWN STRL REUS W/ TWL LRG LVL3 (GOWN DISPOSABLE) ×2 IMPLANT
GOWN STRL REUS W/ TWL XL LVL3 (GOWN DISPOSABLE) ×2 IMPLANT
GOWN STRL REUS W/TWL LRG LVL3 (GOWN DISPOSABLE) ×3
GOWN STRL REUS W/TWL XL LVL3 (GOWN DISPOSABLE) ×3
HANDLE SUCTION POOLE (INSTRUMENTS) ×2 IMPLANT
KIT BASIN OR (CUSTOM PROCEDURE TRAY) ×3 IMPLANT
KIT TURNOVER KIT B (KITS) ×3 IMPLANT
LIGASURE IMPACT 36 18CM CVD LR (INSTRUMENTS) IMPLANT
NS IRRIG 1000ML POUR BTL (IV SOLUTION) ×12 IMPLANT
PACK GENERAL/GYN (CUSTOM PROCEDURE TRAY) ×3 IMPLANT
PAD ARMBOARD 7.5X6 YLW CONV (MISCELLANEOUS) ×3 IMPLANT
PENCIL SMOKE EVACUATOR (MISCELLANEOUS) ×3 IMPLANT
SPONGE T-LAP 18X18 ~~LOC~~+RFID (SPONGE) IMPLANT
STAPLER VISISTAT 35W (STAPLE) ×3 IMPLANT
SUCTION POOLE HANDLE (INSTRUMENTS) ×3
SUT ETHILON 2 0 FS 18 (SUTURE) ×3 IMPLANT
SUT PDS AB 1 TP1 96 (SUTURE) ×6 IMPLANT
SUT SILK 2 0 SH CR/8 (SUTURE) ×3 IMPLANT
SUT SILK 2 0 TIES 10X30 (SUTURE) ×3 IMPLANT
SUT SILK 3 0 SH CR/8 (SUTURE) ×3 IMPLANT
SUT SILK 3 0 TIES 10X30 (SUTURE) ×3 IMPLANT
TAPE CLOTH SURG 6X10 WHT LF (GAUZE/BANDAGES/DRESSINGS) ×3 IMPLANT
TOWEL GREEN STERILE (TOWEL DISPOSABLE) ×3 IMPLANT
TRAY FOLEY MTR SLVR 16FR STAT (SET/KITS/TRAYS/PACK) ×3 IMPLANT
YANKAUER SUCT BULB TIP NO VENT (SUCTIONS) ×3 IMPLANT

## 2021-02-03 NOTE — ED Triage Notes (Addendum)
Pt to triage via [facility] from home.  Reports weakness since Monday.  Diagnosed with UTI and received IM antibiotic yesterday.  Reports generalized abd pain, nausea, and vomiting since yesterday.  Pt hypotensive on arrival to triage.  CBG 320 per EMS.

## 2021-02-03 NOTE — Transfer of Care (Signed)
Immediate Anesthesia Transfer of Care Note  Patient: Isaiah Jackson  Procedure(s) Performed: EXPLORATORY LAPAROTOMY REPAIR OF PERFORATED ULCER (Abdomen)  Patient Location: PACU  Anesthesia Type:General  Level of Consciousness: drowsy and responds to stimulation  Airway & Oxygen Therapy: Patient Spontanous Breathing and Patient connected to face mask oxygen  Post-op Assessment: Report given to RN and Post -op Vital signs reviewed and stable  Post vital signs: Reviewed and stable  Last Vitals:  Vitals Value Taken Time  BP 128/80 01/14/2021 1711  Temp    Pulse 75 02/04/2021 1724  Resp 19 01/10/2021 1724  SpO2 97 % 01/13/2021 1724  Vitals shown include unvalidated device data.  Last Pain:  Vitals:   02/04/2021 1443  TempSrc: Oral  PainSc: 7       Patients Stated Pain Goal: 5 (77/11/65 7903)  Complications: No notable events documented.

## 2021-02-03 NOTE — Progress Notes (Addendum)
Presented to PACU to evaluate patient at the request of the PACU staff concerning patients respiratory effort and bed assignment. On evaluation the patient was lethargic, minimally responsive, tachypneic (RR 24), sats 97-98% on [place] [facility] [place], shallowly breathing with use of accessory muscles. The patient would nod to questions but was unable to verbalize responses, increase respiratory effort or follow complex commands. Concerns that the patient would eventually need reintubation and ICU placement prompted a discussion with Dr Rosendo Gros, who was operating, to update him on the situation. Upon returning to the PACU I had a conversation with the patient's daughters at his bedside. We discussed his current clinical condition, the likelihood he would require intubation overnight, DNR status, and post intubation decisions to begin thinking about. Given his clinical state and continued deterioration they agreed to intubation in the PACU and confirmed his DNR status would be in effect once intubation was completed. After they left the PACU, the patient was preoxygenated with an AMBU bag (2050),  successfully intubated by the CRNA (2054) and transitioned to mechanical ventilation with the assistance of the PACU RNs and RT. Precedex sedation was initiated, an ABG drawn, CXR to confirm tube placement, IVF/albumin boluses and phenylephrine boluses/infusion were used for hemodynamic support. The patient's vitals stabilized: Pulse: 62  BP: 110/54  sPO2: 100% Dr Rosendo Gros and the family were updated and an ICU consult was placed by Dr Rosendo Gros.   Oleta Mouse, MD 01/06/2021 10:26 PM

## 2021-02-03 NOTE — Progress Notes (Signed)
Pharmacy Antibiotic Note  Isaiah Jackson is a 87 y.o. male admitted on 01/26/2021 with  intra-abdominal .  Pharmacy has been consulted for Zosyn dosing.  Plan: Zosyn 2.25g IV q8h (4 hour infusion) Monitor renal function and cultures  Height: 6\' 2"  (188 cm) Weight: 81.6 kg (180 lb) IBW/kg (Calculated) : 82.2  Temp (24hrs), Avg:98.5 F (36.9 C), Min:97.7 F (36.5 C), Max:99.3 F (37.4 C)  Recent Labs  Lab 02/02/2021 1032  WBC 15.4*  CREATININE 3.64*    Estimated Creatinine Clearance: 17.1 mL/min (A) (by C-G formula based on SCr of 3.64 mg/dL (H)).    Allergies  Allergen Reactions   Bee Venom    Capoten [Captopril]     Fatigue/depression   Lamisil [Terbinafine] Rash    Antimicrobials this admission: Zosyn 7/30 >>   Microbiology results: Pending  Thank you for allowing pharmacy to be a part of this patient's care.  Heloise Purpura 02/02/2021 1:42 PM

## 2021-02-03 NOTE — ED Provider Notes (Signed)
[facility] EMERGENCY DEPARTMENT Provider Note   CSN: [id number] Arrival date & time: 01/12/2021  1024     History Chief Complaint  Patient presents with   Abdominal Pain    Isaiah Jackson is a 87 y.o. male with a history of COPD, hypertension, hyperlipidemia, CAD, CKD, and stage IV kidney cancer who presents to the emergency department with complaints of abdominal pain which has progressively worsened over the past 2 to 3 days.  Patient states that the pain is generalized, constant, and seem to acutely worsen last night.  No specific alleviating or aggravating factors.  He has associated nausea, vomiting, chills, hematuria, and generalized weakness.  He states he was seen by urology for this and was told that he had an infection-he was given a shot of antibiotics and a prescription was sent which they were planning to pick up today.  Given his worsening pain he came to the emergency department for further evaluation.  Last bowel movement was 2023-02-07, he has passed gas since.  Denies fever, hematemesis, melena, hematochezia, diarrhea, cough, or dyspnea.  HPI     Past Medical History:  Diagnosis Date   Benign prostatic hypertrophy    history of post catheterization   COPD (chronic obstructive pulmonary disease) (HCC)    Coronary artery disease    s/p PTA and stenting   Diabetes mellitus    Hyperlipidemia    Hypertension    Kidney stone    Myocardial infarction [facility])    Nephrolithiasis    history of cystoscopy   Vertigo 07/02/2017   Vitamin D deficiency     Patient Active Problem List   Diagnosis Date Noted   Cancer of left kidney ([place]) 06-Feb-2021   Actinic keratoses 09/01/2019   Former smoker (17 pack/year history, quit 1991) 08/31/2019   CKD stage 4 due to type 2 diabetes mellitus ([place]) 10/13/2017   Atherosclerosis of aorta ([place]) 10/11/2016   PAD (peripheral artery disease) ([place]) 10/11/2016   Overweight (BMI 25.0-29.9) 05/25/2015   ASCAD s/p PTCA/Stents  11/07/2014   Medication management 10/13/2013   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 ([place]) 10/13/2013   Hyperlipidemia associated with type 2 diabetes mellitus ([place])    Hypertension    Vitamin D deficiency    COPD (chronic obstructive pulmonary disease) ([place])    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 Katy Fitch   cytoscopy     for kidney stones   SHOULDER SURGERY Right        Family History  Problem Relation Age of Onset   Diabetes Mother    Heart disease Mother    Heart attack Mother 23       mother passed as a result   Diabetes Daughter    Diabetes Sister    Heart disease Father     Social History   Tobacco Use   Smoking status: Former    Packs/day: 1.00    Years: 42.00    Pack years: 42.00    Types: Cigarettes    Quit date: 1991    Years since quitting: 31.5   Smokeless tobacco: Never  Vaping Use   Vaping Use: Never used  Substance Use Topics   Alcohol use: No   Drug use: No    Home Medications Prior to Admission medications   Medication Sig  Start Date End Date Taking? Authorizing Provider  amLODipine (NORVASC) 5 MG tablet Take 2 tablets (10 mg total) by mouth at bedtime. Take 1 tablet in the evening for BP 01/15/21   Mesner, Corene Cornea, MD  Blood Glucose Monitoring Suppl DEVI Please provide glucometer per insurance coverage.  Insurance no longer covers supplies for current Freestyle Freedom lite.  Check blood glucose three times a day and as needed. 09/30/19   Garnet Sierras, NP  clopidogrel (PLAVIX) 75 MG tablet Take  1 tablet  Daily  to Prevent Strokes Patient taking differently: Take 75 mg by mouth daily. 10/18/20   [name], MD  fenofibrate micronized (LOFIBRA) 134 MG capsule Take 1 capsule Daily for Triglycerides (Blood Fats) Patient taking differently: Take 134 mg by  mouth daily. 10/09/20   Liane Comber, NP  glucose blood (FREESTYLE LITE) test strip USE 1 STRIP TO CHECK GLUCOSE ONCE DAILY. Dx: E11.29 09/21/19   Garnet Sierras, NP  glucose blood test strip Use as instructed.  Check blood glucose three times a day and as needed. 09/30/19   McClanahan, Danton Sewer, NP  JARDIANCE 10 MG TABS tablet Take 10 mg by mouth every evening. Patient not taking: Reported on 01/29/2021 12/26/20   [provider]  olmesartan (BENICAR) 40 MG tablet Take  1/2 tablet (20 mg)   2 x /day - Am & Pm for BP Patient taking differently: Take 20 mg by mouth in the morning and at bedtime. 11/01/20   [name], MD  simvastatin (ZOCOR) 40 MG tablet Take 1 tablet at Bedtime for Cholesterol Patient taking differently: Take 40 mg by mouth at bedtime. 10/09/20   Liane Comber, NP    Allergies    Bee venom, Capoten [captopril], and Lamisil [terbinafine]  Review of Systems   Review of Systems  Constitutional:  Positive for chills. Negative for fever.  Respiratory:  Negative for cough and shortness of breath.   Cardiovascular:  Negative for chest pain.  Gastrointestinal:  Positive for abdominal pain, nausea and vomiting. Negative for blood in stool and diarrhea.  Genitourinary:  Positive for hematuria.  Neurological:  Positive for syncope and weakness.  All other systems reviewed and are negative.  Physical Exam Updated Vital Signs BP 96/70 (BP Location: Left Arm)   Pulse 93   Temp 97.7 F (36.5 C) (Oral)   Resp 16   SpO2 93%   Physical Exam Vitals and nursing note reviewed.  Constitutional:      General: He is in acute distress (mild appears uncomfortable).     Appearance: He is well-developed. He is not toxic-appearing.  HENT:     Head: Normocephalic and atraumatic.  Eyes:     General:        Right eye: No discharge.        Left eye: No discharge.     Conjunctiva/sclera: Conjunctivae normal.  Cardiovascular:     Rate and Rhythm: Normal rate and regular rhythm.   Pulmonary:     Effort: Pulmonary effort is normal. No respiratory distress.     Breath sounds: Normal breath sounds. No wheezing, rhonchi or rales.  Abdominal:     General: There is distension.     Palpations: Abdomen is soft.     Tenderness: There is abdominal tenderness (generalized). There is guarding (mild throughout).  Musculoskeletal:     Cervical back: Neck supple.  Skin:    General: Skin is warm and dry.     Findings: No rash.  Neurological:     Mental Status:  He is alert.     Comments: Clear speech.   Psychiatric:        Behavior: Behavior normal.    ED Results / Procedures / Treatments   Labs (all labs ordered are listed, but only abnormal results are displayed) Labs Reviewed  CBC - Abnormal; Notable for the following components:      Result Value   WBC 15.4 (*)    RBC 8.30 (*)    Hemoglobin 20.4 (*)    HCT 65.2 (*)    MCV 78.6 (*)    MCH 24.6 (*)    RDW 20.9 (*)    All other components within normal limits  LIPASE, BLOOD  COMPREHENSIVE METABOLIC PANEL  URINALYSIS, ROUTINE W REFLEX MICROSCOPIC    EKG None  Radiology CT Abdomen Pelvis Wo Contrast  Result Date: 01/12/2021 CLINICAL DATA:  Abdominal pain and weakness. Previous abdominal aortic aneurysm repair. EXAM: CT ABDOMEN AND PELVIS WITHOUT CONTRAST TECHNIQUE: Multidetector CT imaging of the abdomen and pelvis was performed following the standard protocol without IV contrast. COMPARISON:  01/15/2021. FINDINGS: Lower chest: Stable enlarged heart and atheromatous coronary artery calcifications. Minimal dependent atelectasis at both lung bases. Hepatobiliary: Normal appearing liver. Gallstones in the gallbladder, the largest measuring 1.7 cm in diameter. No gallbladder wall thickening or pericholecystic fluid. Pancreas: Mild to moderate diffuse pancreatic atrophy. Spleen: Small calcified granuloma.  Normal in size and shape. Adrenals/Urinary Tract: Normal appearing adrenal glands. Again demonstrated is moderate  dilatation of the right renal collecting system to the level of the ureteropelvic junction. A heterogeneous lower pole mass is again demonstrated measuring 7.2 x 6.4 cm on image number 42/3, previously 6.8 x 6.5 cm. This is involving the UPJ. Normal appearing right kidney, ureters and urinary bladder. Stomach/Bowel: Multiple sigmoid colon diverticula without evidence of diverticulitis. Normal appearing appendix. Suggestion of mild poorly defined wall thickening in the region of the distal gastric antrum and proximal duodenum slight adjacent soft tissue stranding. Vascular/Lymphatic: Stable aorto bi-iliac stent. Proximal aortic atheromatous calcifications. No enlarged lymph nodes. Reproductive: Prostate is unremarkable.  Bilateral hydroceles. Other: Small bilateral inguinal hernias containing fat. Small to moderate amount of free peritoneal air and small amount of free peritoneal fluid. Musculoskeletal: Lumbar and lower thoracic spine degenerative changes. IMPRESSION: 1. Free peritoneal air and free peritoneal fluid compatible with bowel perforation. 2. Suggestion of mild poorly defined wall thickening and adjacent soft tissue stranding in the region of the distal gastric antrum and proximal duodenum, possibly indicating a ruptured gastric or duodenal ulcer as the source of perforation. 3. Again demonstrated large lower pole left renal mass most likely representing a primary renal cell carcinoma and causing moderate hydronephrosis due to obstruction at the UPJ. 4. Sigmoid diverticulosis. 5.  Calcific coronary artery and aortic atherosclerosis. 6. Bilateral hydroceles and small inguinal hernias containing fat. 7. Cholelithiasis without evidence cholecystitis. Electronically Signed   By: Claudie Revering M.D.   On: 01/17/2021 12:47   DG Chest Portable 1 View  Result Date: 01/07/2021 CLINICAL DATA:  Hypoxia.  Abdominal pain. EXAM: PORTABLE CHEST 1 VIEW COMPARISON:  01/15/2020 FINDINGS: Stable enlarged cardiac silhouette  and tortuous aorta. Clear lungs. Lower thoracic spine degenerative changes. IMPRESSION: No acute abnormality.  Stable cardiomegaly. Electronically Signed   By: Claudie Revering M.D.   On: 01/21/2021 12:55    Procedures .Critical Care  Date/Time: 01/21/2021 2:28 PM Performed by: Amaryllis Dyke, PA-C Authorized by: Amaryllis Dyke, PA-C    CRITICAL CARE Performed by: Kennith Maes   Total critical  care time: 35 minutes  Critical care time was exclusive of separately billable procedures and treating other patients.  Critical care was necessary to treat or prevent imminent or life-threatening deterioration.  Critical care was time spent personally by me on the following activities: development of treatment plan with patient and/or surrogate as well as nursing, discussions with consultants, evaluation of patient's response to treatment, examination of patient, obtaining history from patient or surrogate, ordering and performing treatments and interventions, ordering and review of laboratory studies, ordering and review of radiographic studies, pulse oximetry and re-evaluation of patient's condition.  Medications Ordered in ED Medications - No data to display  ED Course  I have reviewed the triage vital signs and the nursing notes.  Pertinent labs & imaging results that were available during my care of the patient were reviewed by me and considered in my medical decision making (see chart for details).    MDM Rules/Calculators/A&P                           Patient presents to the ED with complaints of abdominal pain.  Initial blood pressure 96/70 in triage, however repeat is 156/82, vitals otherwise reassuring, will check rectal temp. Abdomen is distended with tenderness and degree of guarding throughout. Analgesics, anti-emetics, & 1L NS ordered. Will check post void bladder scan as well.   Additional history obtained:  Additional history obtained from chart review &  nursing note review.   Lab Tests:  I Ordered, reviewed, and interpreted labs, which included:  CBC: Leukocytosis of 15.4 with elevated hemoglobin and hematocrit. CMP: acute on chronic renal disease with multiple electrolyte derangements.  Lipase: Mild elevation.  UA: Hematuria, no obvious UTI- sent for cx.   Patient w/ mild hypoxia- hx of COPD, 2L via  applied with improvement.   Imaging Studies ordered:  I ordered imaging studies which included CXR & CT A/P, I independently reviewed, formal radiology impression shows:  CXR: No acute abnormality.  Stable cardiomegaly CT A/P: 1. Free peritoneal air and free peritoneal fluid compatible with bowel perforation. 2. Suggestion of mild poorly defined wall thickening and adjacent soft tissue stranding in the region of the distal gastric antrum and proximal duodenum, possibly indicating a ruptured gastric or duodenal ulcer as the source of perforation. 3. Again demonstrated large lower pole left renal mass most likely representing a primary renal cell carcinoma and causing moderate hydronephrosis due to obstruction at the UPJ. 4. Sigmoid diverticulosis. 5.  Calcific coronary artery and aortic atherosclerosis. 6. Bilateral hydroceles and small inguinal hernias containing fat. 7. Cholelithiasis without evidence cholecystitis.  ED Course:  Patient with bowel perforation on CT, zosyn ordered, consult placed to general surgery   13:25: Increased pain, 8/10 in severity, BP 180s/90s- -0.5 mg of dilaudid ordered.   13:29: CONSULT: Case discussed with general surgeon Dr. Grandville Silos - coming to ED to evaluate the patient.   Patient evaluated by general surgery team in the ED- Patient to go to OR with Dr. Grandville Silos- admit to medicine.   14:24: CONSULT: Discussed with hospitalist Dr. Roosevelt Locks- accepts admission.   Patient & his daughter updated on results & plan of care & are in agreement.   Findings and plan of care discussed with supervising physician Dr.  Billy Fischer who has evaluated patient as shared visit, provided guidance & is in agreement.   Portions of this note were generated with [employer]obby[employer]. Dictation errors may occur despite best attempts at proofreading.  Final Clinical Impression(s) / ED Diagnoses Final diagnoses:  Bowel perforation (HCC)  Acute renal failure superimposed on chronic kidney disease, unspecified CKD stage, unspecified acute renal failure type [facility])    Rx / DC Orders ED Discharge Orders     None        Amaryllis Dyke, PA-C 01/07/2021 1428    Gareth Morgan, MD 01/16/2021 [id number]

## 2021-02-03 NOTE — Op Note (Signed)
  01/13/2021  4:35 PM  PATIENT:  Isaiah Jackson  87 y.o. male  PRE-OPERATIVE DIAGNOSIS:  PERFORATED ULCER  POST-OPERATIVE DIAGNOSIS:  PERFORATED ULCER  PROCEDURE:  Procedure(s): EXPLORATORY LAPAROTOMY REPAIR OF PERFORATED ULCER  SURGEON:  Surgeon(s): Georganna Skeans, MD  ASSISTANTS: none   ANESTHESIA:   general  EBL:  Total I/O In: 2400 [I.V.:900; IV Piggyback:1500] Out: 315 [Urine:215; Other:50; Blood:50]  BLOOD ADMINISTERED:none  DRAINS: (1) Jackson-Pratt drain(s) with closed bulb suction in the R abd    SPECIMEN:  No Specimen  DISPOSITION OF SPECIMEN:  N/A  COUNTS:  YES  DICTATION: .Dragon Dictation Findings: 1 cm perforated pyloric ulcer  Procedure in detail: Informed consent was obtained.  He received intravenous antibiotics.  He was brought to the operating room and general endotracheal anesthesia was administered by the anesthesia staff.  His abdomen was prepped and draped in a sterile fashion.  Foley catheter was placed by nursing.  We did a timeout procedure.  Upper midline incision was made.  Subcutaneous tissues were dissected down to the fascia.  This was divided along the midline and the peritoneal cavity was entered under direct vision.  The fascia was opened to the length of the incision.  Exploration of the right upper quadrant revealed some omentum gathered over the distal stomach and proximal duodenum.  There was a perforated pyloric ulcer about 1 cm in size.  There was a large volume of bilious fluid.  This was all evacuated out.  I then closed the ulcer with multiple 2-0 silk sutures.  I left the arms of the sutures long.  I brought a tongue of omentum up and laid it down over the repair and tied securely with all of the silk sutures.  I irrigated the abdomen thoroughly.  I placed a 46 [place] Blake drain up in the right upper quadrant and brought it out through the right abdominal wall.  It was secured with nylon.  The remainder the irrigation was evacuated.   Fascia was closed with running #1 looped PDS.  I left the skin open packed with wet-to-dry.  All counts were correct.  He tolerated the procedure well without apparent complication.  He was going to be extubated and taken to recovery room.  I updated the hospitalist team who is admitting. PATIENT DISPOSITION:  PACU - guarded condition.   Delay start of Pharmacological VTE agent (>24hrs) due to surgical blood loss or risk of bleeding:  no  Georganna Skeans, MD, MPH, FACS Pager: [phone]  7/30/20224:35 PM

## 2021-02-03 NOTE — Progress Notes (Signed)
CBG 95, Endotool not working, Dr. Ermalene Postin states stop insulin, give 0/5 amp of D50 and check CBG in 15 min.

## 2021-02-03 NOTE — H&P (Addendum)
History and Physical    VENCIL BASNETT PIR:[id number] DOB: 30-May-1934 DOA: 01/21/2021  PCP: [name], MD (Confirm with patient/family/NH records and if not entered, this has to be entered at [facility] point of entry) Patient coming from: Home  I have personally briefly reviewed patient's old medical records in [place]  Chief Complaint: Belly hurts  HPI: GIBRIL MASTRO is a 87 y.o. male with medical history significant of IIDM, COPD, HTN, recently diagnosed left renal cancer declined treatment, CKD stage IV, HLD, CAD, stroke on Plavix, presented with worsening of abdominal pain and nausea/vomiting.  Patient has had intermittent feeling nauseous and frequent vomiting for about 6 to 7 days, on case or stomach content, nonbilious nonbloody.  No fever or chills.  3 days ago patient started to feel intermittent epigastric pain, and worsening feeling of nausea and vomiting.  Pain has been localized, worsening with eating or drinking and as a result patient became very dehydrated.  Patient went to see PCP yesterday was diagnosed with UTI and received 1 dose of IM antibiotics.  This morning, patient woke up with severe worsening of diffuse abdominal pain 10/10.  Recently, last month patient had worsening of his kidney function, and a routine renal ultrasound from has a large left kidney mass, then patient was referred to see outpatient urologist.  But after discussion between patient, his family and urology and nephrology, patient decided not to pursue further work-up or treatment for the left kidney cancer or consider dialysis in the future.  Patient is DNR, confirmed with his daughter over the phone.  ED Course: No hypotension or tachycardia afebrile.  WBC 15, hemoglobin 20 likely hemoconcentration.  CT abdomen without contrast showed free air peritoneal cavity, suspicious for perforation gastric versus duodenal source.  Zosyn started in ED, general surgery consulted and plans to take  patient to the OR.  Review of Systems: As per HPI otherwise 14 point review of systems negative.    Past Medical History:  Diagnosis Date   Benign prostatic hypertrophy    history of post catheterization   COPD (chronic obstructive pulmonary disease) (HCC)    Coronary artery disease    s/p PTA and stenting   Diabetes mellitus    Hyperlipidemia    Hypertension    Kidney stone    Myocardial infarction [facility])    Nephrolithiasis    history of cystoscopy   Stroke [facility])    Vertigo 07/02/2017   Vitamin D deficiency     Past Surgical History:  Procedure Laterality Date   ABDOMINAL AORTIC ANEURYSM REPAIR  10/23/2005   endograft   CATARACT EXTRACTION, BILATERAL Bilateral 2021   Dr. Katy Fitch   cytoscopy     for kidney stones   SHOULDER SURGERY Right      reports that he quit smoking about 31 years ago. His smoking use included cigarettes. He has a 42.00 pack-year smoking history. He has never used smokeless tobacco. He reports that he does not drink alcohol and does not use drugs.  Allergies  Allergen Reactions   Bee Venom    Capoten [Captopril]     Fatigue/depression   Lamisil [Terbinafine] Rash    Family History  Problem Relation Age of Onset   Diabetes Mother    Heart disease Mother    Heart attack Mother 74       mother passed as a result   Diabetes Daughter    Diabetes Sister    Heart disease Father      Prior  to Admission medications   Medication Sig Start Date End Date Taking? Authorizing Provider  amLODipine (NORVASC) 5 MG tablet Take 2 tablets (10 mg total) by mouth at bedtime. Take 1 tablet in the evening for BP 01/15/21  Yes Mesner, Corene Cornea, MD  clopidogrel (PLAVIX) 75 MG tablet Take  1 tablet  Daily  to Prevent Strokes Patient taking differently: Take 75 mg by mouth daily. 10/18/20  Yes [name], MD  fenofibrate micronized (LOFIBRA) 134 MG capsule Take 1 capsule Daily for Triglycerides (Blood Fats) Patient taking differently: Take 134 mg by mouth daily.  10/09/20  Yes Liane Comber, NP  JARDIANCE 10 MG TABS tablet Take 10 mg by mouth every evening. 12/26/20  Yes [provider]  olmesartan (BENICAR) 40 MG tablet Take  1/2 tablet (20 mg)   2 x /day - Am & Pm for BP Patient taking differently: Take 20 mg by mouth in the morning and at bedtime. 11/01/20  Yes [name], MD  simvastatin (ZOCOR) 40 MG tablet Take 1 tablet at Bedtime for Cholesterol Patient taking differently: Take 40 mg by mouth at bedtime. 10/09/20  Yes Liane Comber, NP  Blood Glucose Monitoring Suppl DEVI Please provide glucometer per insurance coverage.  Insurance no longer covers supplies for current Freestyle Freedom lite.  Check blood glucose three times a day and as needed. 09/30/19   McClanahan, Danton Sewer, NP  glucose blood (FREESTYLE LITE) test strip USE 1 STRIP TO CHECK GLUCOSE ONCE DAILY. Dx: E11.29 09/21/19   Garnet Sierras, NP  glucose blood test strip Use as instructed.  Check blood glucose three times a day and as needed. 09/30/19   Garnet Sierras, NP    Physical Exam: Vitals:   01/15/2021 1250 01/10/2021 1330 01/25/2021 1400 01/15/2021 1443  BP: (!) 173/81 (!) 182/93 (!) 156/74 (!) 145/92  Pulse: 83 89 91 91  Resp: 16 16 16 16   Temp: 99.3 F (37.4 C)   98.1 F (36.7 C)  TempSrc: Rectal   Oral  SpO2: 94% 95% 93% 95%  Weight:    85.3 kg  Height:    6' (1.829 m)    Constitutional: NAD, calm, comfortable Vitals:   01/23/2021 1250 01/24/2021 1330 01/31/2021 1400 01/07/2021 1443  BP: (!) 173/81 (!) 182/93 (!) 156/74 (!) 145/92  Pulse: 83 89 91 91  Resp: 16 16 16 16   Temp: 99.3 F (37.4 C)   98.1 F (36.7 C)  TempSrc: Rectal   Oral  SpO2: 94% 95% 93% 95%  Weight:    85.3 kg  Height:    6' (1.829 m)   Eyes: PERRL, lids and conjunctivae normal, moaning for discomfort. ENMT: Mucous membranes are dry. Posterior pharynx clear of any exudate or lesions.Normal dentition.  Neck: normal, supple, no masses, no thyromegaly Respiratory: clear to auscultation bilaterally,  no wheezing, no crackles. Normal respiratory effort. No accessory muscle use.  Cardiovascular: Regular rate and rhythm, no murmurs / rubs / gallops. No extremity edema. 2+ pedal pulses. No carotid bruits.  Abdomen: Severe tenderness on periumbilical area, decreased bowel sounds musculoskeletal: no clubbing / cyanosis. No joint deformity upper and lower extremities. Good ROM, no contractures. Normal muscle tone.  Skin: no rashes, lesions, ulcers. No induration Neurologic: CN 2-12 grossly intact. Sensation intact, DTR normal. Strength 5/5 in all 4.  Psychiatric: Normal judgment and insight. Alert and oriented x 3. Normal mood.     Labs on Admission: I have personally reviewed following labs and imaging studies  CBC: Recent Labs  Lab 01/08/2021 1032  WBC 15.4*  HGB 20.4*  HCT 65.2*  MCV 78.6*  PLT 956   Basic Metabolic Panel: Recent Labs  Lab 01/15/2021 1032  NA 123*  K 5.1  CL 89*  CO2 18*  GLUCOSE 311*  BUN 53*  CREATININE 3.64*  CALCIUM 10.7*   GFR: Estimated Creatinine Clearance: 16.3 mL/min (A) (by C-G formula based on SCr of 3.64 mg/dL (H)). Liver Function Tests: Recent Labs  Lab 01/08/2021 1032  AST 27  ALT 14  ALKPHOS 52  BILITOT 1.9*  PROT 7.5  ALBUMIN 3.3*   Recent Labs  Lab 01/23/2021 1032  LIPASE 54*   No results for input(s): AMMONIA in the last 168 hours. Coagulation Profile: No results for input(s): INR, PROTIME in the last 168 hours. Cardiac Enzymes: No results for input(s): CKTOTAL, CKMB, CKMBINDEX, TROPONINI in the last 168 hours. BNP (last 3 results) No results for input(s): PROBNP in the last 8760 hours. HbA1C: No results for input(s): HGBA1C in the last 72 hours. CBG: No results for input(s): GLUCAP in the last 168 hours. Lipid Profile: No results for input(s): CHOL, HDL, LDLCALC, TRIG, CHOLHDL, LDLDIRECT in the last 72 hours. Thyroid Function Tests: No results for input(s): TSH, T4TOTAL, FREET4, T3FREE, THYROIDAB in the last 72  hours. Anemia Panel: No results for input(s): VITAMINB12, FOLATE, FERRITIN, TIBC, IRON, RETICCTPCT in the last 72 hours. Urine analysis:    Component Value Date/Time   COLORURINE YELLOW 01/25/2021 1128   APPEARANCEUR HAZY (A) 01/22/2021 1128   LABSPEC 1.001 (L) 01/15/2021 1128   PHURINE 6.0 01/26/2021 1128   GLUCOSEU 50 (A) 01/24/2021 1128   HGBUR LARGE (A) 01/12/2021 1128   BILIRUBINUR NEGATIVE 02/01/2021 1128   [place] 01/13/2021 1128   PROTEINUR NEGATIVE 01/08/2021 1128   NITRITE NEGATIVE 01/29/2021 1128   LEUKOCYTESUR NEGATIVE 01/09/2021 1128    Radiological Exams on Admission: CT Abdomen Pelvis Wo Contrast  Result Date: 01/30/2021 CLINICAL DATA:  Abdominal pain and weakness. Previous abdominal aortic aneurysm repair. EXAM: CT ABDOMEN AND PELVIS WITHOUT CONTRAST TECHNIQUE: Multidetector CT imaging of the abdomen and pelvis was performed following the standard protocol without IV contrast. COMPARISON:  01/15/2021. FINDINGS: Lower chest: Stable enlarged heart and atheromatous coronary artery calcifications. Minimal dependent atelectasis at both lung bases. Hepatobiliary: Normal appearing liver. Gallstones in the gallbladder, the largest measuring 1.7 cm in diameter. No gallbladder wall thickening or pericholecystic fluid. Pancreas: Mild to moderate diffuse pancreatic atrophy. Spleen: Small calcified granuloma.  Normal in size and shape. Adrenals/Urinary Tract: Normal appearing adrenal glands. Again demonstrated is moderate dilatation of the right renal collecting system to the level of the ureteropelvic junction. A heterogeneous lower pole mass is again demonstrated measuring 7.2 x 6.4 cm on image number 42/3, previously 6.8 x 6.5 cm. This is involving the UPJ. Normal appearing right kidney, ureters and urinary bladder. Stomach/Bowel: Multiple sigmoid colon diverticula without evidence of diverticulitis. Normal appearing appendix. Suggestion of mild poorly defined wall thickening  in the region of the distal gastric antrum and proximal duodenum slight adjacent soft tissue stranding. Vascular/Lymphatic: Stable aorto bi-iliac stent. Proximal aortic atheromatous calcifications. No enlarged lymph nodes. Reproductive: Prostate is unremarkable.  Bilateral hydroceles. Other: Small bilateral inguinal hernias containing fat. Small to moderate amount of free peritoneal air and small amount of free peritoneal fluid. Musculoskeletal: Lumbar and lower thoracic spine degenerative changes. IMPRESSION: 1. Free peritoneal air and free peritoneal fluid compatible with bowel perforation. 2. Suggestion of mild poorly defined wall thickening and adjacent soft tissue stranding in the region of  the distal gastric antrum and proximal duodenum, possibly indicating a ruptured gastric or duodenal ulcer as the source of perforation. 3. Again demonstrated large lower pole left renal mass most likely representing a primary renal cell carcinoma and causing moderate hydronephrosis due to obstruction at the UPJ. 4. Sigmoid diverticulosis. 5.  Calcific coronary artery and aortic atherosclerosis. 6. Bilateral hydroceles and small inguinal hernias containing fat. 7. Cholelithiasis without evidence cholecystitis. Electronically Signed   By: Claudie Revering M.D.   On: 01/31/2021 12:47   DG Chest Portable 1 View  Result Date: 01/27/2021 CLINICAL DATA:  Hypoxia.  Abdominal pain. EXAM: PORTABLE CHEST 1 VIEW COMPARISON:  01/15/2020 FINDINGS: Stable enlarged cardiac silhouette and tortuous aorta. Clear lungs. Lower thoracic spine degenerative changes. IMPRESSION: No acute abnormality.  Stable cardiomegaly. Electronically Signed   By: Claudie Revering M.D.   On: 02/02/2021 12:55    EKG: Independently reviewed. Sinus, chronic ST-T changes on V1-V3 compared to the EKG before.  Assessment/Plan Active Problems:   Bowel perforation (HCC)  (please populate well all problems here in Problem List. (For example, if patient is on BP meds at  home and you resume or decide to hold them, it is a problem that needs to be her. Same for CAD, COPD, HLD and so on)  Acute peritonitis secondary to bowel perforation -Suspected perforated peptic ulcer -Continue Zosyn -Emergency exploratory laparotomy as per surgical team -NPO -Hold Plavix for today -Start PPI BID.  IIDM uncontrolled -Agreed with peri-op insulin drip -Sliding scale  AKI on CKD stage IV -Severe hypovolemia and hemoconcentration -Start maintenance IV fluid normal saline 100 mL/h. -No indication for emergency HD this moment.  Leukocytosis -Probably more from hemoconcentration, doubt sepsis -Antibiotics, hydration, reevaluate.  Hyponatremia -Acute on chronic -Multifactoral from severe dehydration and hyperglycemia. -Plans to correct his volume status first then re-evaluate Na level.  Left kidney mass with obstructive uropathy/hydronephrosis -Discussed with patient and his daughter over the phone, both expressed that no further workup or intervention.  Hx of CAD, Stroke -Hold Plavix today.  HTN uncontrolled -PRN Hydralazine until patient can take PO.  DVT prophylaxis: Heparin subQ and SCD Code Status: DNR Family Communication: Daughter Kenney Houseman over the phone Disposition Plan: Expect 3 to 5 days hospital stay to treat perforated bowel, acute peritonitis, probably will need rehab, ordered PT evaluation. Consults called: General surgeon Admission status: PCU   Lequita Halt MD [facility] Pager [phone]  01/25/2021, 4:08 PM

## 2021-02-03 NOTE — Consult Note (Signed)
NAME:  Isaiah Jackson, MRN:  [id number], DOB:  04-25-1934, LOS: 0 ADMISSION DATE:  01/13/2021, CONSULTATION DATE:  7/30.22 REFERRING MD:  Claudie Fisherman , CHIEF COMPLAINT:  respiratory failure   History of Present Illness:  87 yo M PMH COPD HTN CKD IV, renal cancer (declined tx) HLD, CAD CVA on plavix, DNR status who presented to [facility] 7/30 w CC abdominal pain, worsening x 6-7 days with associated vomiting. Found to have a bowel perf, taken emergently to OR with Dr. Grandville Silos for ex lap and repair.   Extubated after the case, however developed respiratory distress in PACU and was intubated in PACU. Reintubation in the setting of the patient's DNR status was discussed between anesthesia and the patient's daughter-- family was in agreement with re-intubation in this situation, with ongoing DNR status in case of arrest.   CCM consulted in this setting   Pertinent  Medical History    Significant Hospital Events: Including procedures, antibiotic start and stop dates in addition to other pertinent events   7/30 admitted to [facility] w bowel perf, OR with thompson for ex lap, repair, extubated after case, reintubated in  PACU, ccm consult  Interim History / Subjective:  Reintubated in PACU  Coming to CVICU on dexmed and a little neo but is stable enough   Objective   Blood pressure (!) 99/53, pulse 61, temperature 97.6 F (36.4 C), resp. rate 14, height 6' (1.829 m), weight 85.3 kg, SpO2 100 %.    Vent Mode: PRVC FiO2 (%):  [40 %] 40 % Set Rate:  [14 bmp-20 bmp] 20 bmp Vt Set:  [620 mL] 620 mL PEEP:  [5 cmH20] 5 cmH20 Plateau Pressure:  [15 cmH20] 15 cmH20   Intake/Output Summary (Last 24 hours) at 01/23/2021 2316 Last data filed at 02/01/2021 1925 Gross per 24 hour  Intake 2900 ml  Output 650 ml  Net 2250 ml   Filed Weights   01/15/2021 1113 01/12/2021 1443  Weight: 81.6 kg 85.3 kg    Examination: General: chronically ill appearing elderly M intubated lightly sedated NAD  HENT: NCAT  ETT secure anicteric sclera  Lungs: CTA, mechanically ventilated, no adventitious sounds  Cardiovascular: rrr s1s2 cap refill < 3 sec, brisk  Abdomen: soft round. JP drain  Extremities: no acute abnormalities no cyanosis no clubbing  Neuro: lightly sedated. Awakens to voice following commands  GU: foley   Resolved Hospital Problem list     Assessment & Plan:   Acute respiratory failure with hypoxia requiring intubation ?emergence related v more a reflection of pts clinical condition Hx COPD  P -WUA SBT In AM -will take to ICU tonight   Bowel perforation, POD 0 ex lap and repair (Op date 7/30) P -per CCS -post op CBC -Zosyn -IVF  CKD IV Renal cancer -pt has declined tx for cancer and has expressed that he would never want dialysis if renal function deteriorated to that extent  -post op BMP -IVF   Hx CVA on plavix -hold anti plt  HTN HLD -holding home meds  -PRN IV hydral   IDDM -SSI  DNR Status / [place] -DNR on admit. Pt has previously expressed desire not to pursue tx for cancer, or measures such as dialysis -hopefully the pt will be extubatable 7/31   Best Practice (right click and "Reselect all SmartList Selections" daily)   Diet/type: NPO DVT prophylaxis: SCD GI prophylaxis: PPI Lines: N/A Foley:  Yes, and it is still needed Code Status:  DNR Last  date of multidisciplinary goals of care discussion [pending]  Labs   CBC: Recent Labs  Lab 01/24/2021 1032 01/30/2021 1639  WBC 15.4* 14.1*  NEUTROABS  --  12.6*  HGB 20.4* 18.3*  HCT 65.2* 57.0*  MCV 78.6* 78.3*  PLT 385 161    Basic Metabolic Panel: Recent Labs  Lab 01/31/2021 1032 01/06/2021 1639  NA 123* 129*  K 5.1 5.5*  CL 89* 98  CO2 18* 17*  GLUCOSE 311* 262*  BUN 53* 50*  CREATININE 3.64* 2.75*  CALCIUM 10.7* 9.5   GFR: Estimated Creatinine Clearance: 21.6 mL/min (A) (by C-G formula based on SCr of 2.75 mg/dL (H)). Recent Labs  Lab 01/08/2021 1032 02/01/2021 1639  WBC 15.4* 14.1*     Liver Function Tests: Recent Labs  Lab 01/20/2021 1032  AST 27  ALT 14  ALKPHOS 52  BILITOT 1.9*  PROT 7.5  ALBUMIN 3.3*   Recent Labs  Lab 01/20/2021 1032  LIPASE 54*   No results for input(s): AMMONIA in the last 168 hours.  ABG    Component Value Date/Time   PHART 7.217 (L) 01/15/2021 2120   PCO2ART 51.5 (H) 01/19/2021 2120   PO2ART 119 (H) 01/12/2021 2120   HCO3 20.2 01/12/2021 2120   ACIDBASEDEF 6.3 (H) 01/25/2021 2120   O2SAT 97.3 01/27/2021 2120     Coagulation Profile: No results for input(s): INR, PROTIME in the last 168 hours.  Cardiac Enzymes: No results for input(s): CKTOTAL, CKMB, CKMBINDEX, TROPONINI in the last 168 hours.  HbA1C: Hgb A1c MFr Bld  Date/Time Value Ref Range Status  10/09/2020 09:55 AM 7.3 (H) <5.7 % of total Hgb Final    Comment:    For someone without known diabetes, a hemoglobin A1c value of 6.5% or greater indicates that they may have  diabetes and this should be confirmed with a follow-up  test. . For someone with known diabetes, a value <7% indicates  that their diabetes is well controlled and a value  greater than or equal to 7% indicates suboptimal  control. A1c targets should be individualized based on  duration of diabetes, age, comorbid conditions, and  other considerations. . Currently, no consensus exists regarding use of hemoglobin A1c for diagnosis of diabetes for children. .   06/27/2020 11:00 AM 7.8 (H) <5.7 % of total Hgb Final    Comment:    For someone without known diabetes, a hemoglobin A1c value of 6.5% or greater indicates that they may have  diabetes and this should be confirmed with a follow-up  test. . For someone with known diabetes, a value <7% indicates  that their diabetes is well controlled and a value  greater than or equal to 7% indicates suboptimal  control. A1c targets should be individualized based on  duration of diabetes, age, comorbid conditions, and  other  considerations. . Currently, no consensus exists regarding use of hemoglobin A1c for diagnosis of diabetes for children. .     CBG: Recent Labs  Lab 01/24/2021 1711 01/05/2021 1821 02/04/2021 1919 01/19/2021 2007 01/27/2021 2234  GLUCAP 245* 237* 200* 176* 95    Review of Systems:   Unable to obtain intubated sedated   Past Medical History:  He,  has a past medical history of Benign prostatic hypertrophy, COPD (chronic obstructive pulmonary disease) ([place]), Coronary artery disease, Diabetes mellitus, Hyperlipidemia, Hypertension, Kidney stone, Myocardial infarction ([place]), Nephrolithiasis, Stroke ([place]), Vertigo (07/02/2017), and Vitamin D deficiency.   Surgical History:   Past Surgical History:  Procedure Laterality Date  ABDOMINAL AORTIC ANEURYSM REPAIR  10/23/2005   endograft   CATARACT EXTRACTION, BILATERAL Bilateral 2021   Dr. Katy Fitch   cytoscopy     for kidney stones   SHOULDER SURGERY Right      Social History:   reports that he quit smoking about 31 years ago. His smoking use included cigarettes. He has a 42.00 pack-year smoking history. He has never used smokeless tobacco. He reports that he does not drink alcohol and does not use drugs.   Family History:  His family history includes Diabetes in his daughter, mother, and sister; Heart attack (age of onset: 9) in his mother; Heart disease in his father and mother.   Allergies Allergies  Allergen Reactions   Bee Venom    Capoten [Captopril]     Fatigue/depression   Lamisil [Terbinafine] Rash     Home Medications  Prior to Admission medications   Medication Sig Start Date End Date Taking? Authorizing Provider  amLODipine (NORVASC) 5 MG tablet Take 2 tablets (10 mg total) by mouth at bedtime. Take 1 tablet in the evening for BP 01/15/21  Yes Mesner, Corene Cornea, MD  clopidogrel (PLAVIX) 75 MG tablet Take  1 tablet  Daily  to Prevent Strokes Patient taking differently: Take 75 mg by mouth daily. 10/18/20  Yes [name],  MD  fenofibrate micronized (LOFIBRA) 134 MG capsule Take 1 capsule Daily for Triglycerides (Blood Fats) Patient taking differently: Take 134 mg by mouth daily. 10/09/20  Yes Liane Comber, NP  JARDIANCE 10 MG TABS tablet Take 10 mg by mouth every evening. 12/26/20  Yes [provider]  olmesartan (BENICAR) 40 MG tablet Take  1/2 tablet (20 mg)   2 x /day - Am & Pm for BP Patient taking differently: Take 20 mg by mouth in the morning and at bedtime. 11/01/20  Yes [name], MD  simvastatin (ZOCOR) 40 MG tablet Take 1 tablet at Bedtime for Cholesterol Patient taking differently: Take 40 mg by mouth at bedtime. 10/09/20  Yes Liane Comber, NP  Blood Glucose Monitoring Suppl DEVI Please provide glucometer per insurance coverage.  Insurance no longer covers supplies for current Freestyle Freedom lite.  Check blood glucose three times a day and as needed. 09/30/19   McClanahan, Danton Sewer, NP  glucose blood (FREESTYLE LITE) test strip USE 1 STRIP TO CHECK GLUCOSE ONCE DAILY. Dx: E11.29 09/21/19   Garnet Sierras, NP  glucose blood test strip Use as instructed.  Check blood glucose three times a day and as needed. 09/30/19   Garnet Sierras, NP     Critical care time: 48 min      CRITICAL CARE Performed by: Cristal Generous   Total critical care time: 48 minutes  Critical care time was exclusive of separately billable procedures and treating other patients.  Critical care was necessary to treat or prevent imminent or life-threatening deterioration.  Critical care was time spent personally by me on the following activities: development of treatment plan with patient and/or surrogate as well as nursing, discussions with consultants, evaluation of patient's response to treatment, examination of patient, obtaining history from patient or surrogate, ordering and performing treatments and interventions, ordering and review of laboratory studies, ordering and review of radiographic studies, pulse  oximetry and re-evaluation of patient's condition.  Eliseo Gum MSN, AGACNP-BC [place] for pager  02/04/2021, 1:58 AM

## 2021-02-03 NOTE — Consult Note (Signed)
Reason for Consult:perforated ulcer Referring Physician: E. Melton Walls is an 87 y.o. male.  HPI: 87yo M with PMHx COPD, AAA, and recently diagnosed L renal cell carcinoma presented to the ED C/O 3d HX abdominal pain. It has gotten progressively worse. W/U in the ED shows dehydration, likely AKI, and leukocytosis of 15,400. CT A/P shows free air and free fluid along with thickening of the distal stomach and duodenum C/W perforated ulcer. I was asked to see him for surgical management. Two of his daughters are at the bedside.  Past Medical History:  Diagnosis Date   Benign prostatic hypertrophy    history of post catheterization   COPD (chronic obstructive pulmonary disease) (HCC)    Coronary artery disease    s/p PTA and stenting   Diabetes mellitus    Hyperlipidemia    Hypertension    Kidney stone    Myocardial infarction [facility])    Nephrolithiasis    history of cystoscopy   Vertigo 07/02/2017   Vitamin D deficiency     Past Surgical History:  Procedure Laterality Date   ABDOMINAL AORTIC ANEURYSM REPAIR  10/23/2005   endograft   CATARACT EXTRACTION, BILATERAL Bilateral 2021   Dr. Katy Fitch   cytoscopy     for kidney stones   SHOULDER SURGERY Right     Family History  Problem Relation Age of Onset   Diabetes Mother    Heart disease Mother    Heart attack Mother 23       mother passed as a result   Diabetes Daughter    Diabetes Sister    Heart disease Father     Social History:  reports that he quit smoking about 31 years ago. His smoking use included cigarettes. He has a 42.00 pack-year smoking history. He has never used smokeless tobacco. He reports that he does not drink alcohol and does not use drugs.  Allergies:  Allergies  Allergen Reactions   Bee Venom    Capoten [Captopril]     Fatigue/depression   Lamisil [Terbinafine] Rash    Medications: I have reviewed the patient's current medications.  Results for orders placed or performed during the  hospital encounter of 01/23/2021 (from the past 48 hour(s))  Lipase, blood     Status: Abnormal   Collection Time: 01/28/2021 10:32 AM  Result Value Ref Range   Lipase 54 (H) 11 - 51 U/L    Comment: Performed at [facility] [address] [place], [place] [address]  Comprehensive metabolic panel     Status: Abnormal   Collection Time: 01/19/2021 10:32 AM  Result Value Ref Range   Sodium 123 (L) 135 - 145 mmol/L   Potassium 5.1 3.5 - 5.1 mmol/L   Chloride 89 (L) 98 - 111 mmol/L   CO2 18 (L) 22 - 32 mmol/L   Glucose, Bld 311 (H) 70 - 99 mg/dL    Comment: Glucose reference range applies only to samples taken after fasting for at least 8 hours.   BUN 53 (H) 8 - 23 mg/dL   Creatinine, Ser 3.64 (H) 0.61 - 1.24 mg/dL   Calcium 10.7 (H) 8.9 - 10.3 mg/dL   Total Protein 7.5 6.5 - 8.1 g/dL   Albumin 3.3 (L) 3.5 - 5.0 g/dL   AST 27 15 - 41 U/L   ALT 14 0 - 44 U/L   Alkaline Phosphatase 52 38 - 126 U/L   Total Bilirubin 1.9 (H) 0.3 - 1.2 mg/dL   GFR,  Estimated 16 (L) >60 mL/min    Comment: (NOTE) Calculated using the CKD-EPI Creatinine Equation (2021)    Anion gap 16 (H) 5 - 15    Comment: Performed at [facility], [place] [address], [place], [place] [address]  CBC     Status: Abnormal   Collection Time: 01/16/2021 10:32 AM  Result Value Ref Range   WBC 15.4 (H) 4.0 - 10.5 K/uL   RBC 8.30 (H) 4.22 - 5.81 MIL/uL   Hemoglobin 20.4 (H) 13.0 - 17.0 g/dL   HCT 65.2 (H) 39.0 - 52.0 %   MCV 78.6 (L) 80.0 - 100.0 fL   MCH 24.6 (L) 26.0 - 34.0 pg   MCHC 31.3 30.0 - 36.0 g/dL   RDW 20.9 (H) 11.5 - 15.5 %   Platelets 385 150 - 400 K/uL   nRBC 0.0 0.0 - 0.2 %    Comment: Performed at [facility] [address], [place], [place] [address]  Urinalysis, Routine w reflex microscopic Urine, Clean Catch     Status: Abnormal   Collection Time: 01/30/2021 11:28 AM  Result Value Ref Range   Color, Urine YELLOW YELLOW   APPearance HAZY (A) CLEAR   Specific Gravity, Urine 1.001 (L) 1.005 -  1.030   pH 6.0 5.0 - 8.0   Glucose, UA 50 (A) NEGATIVE mg/dL   Hgb urine dipstick LARGE (A) NEGATIVE   Bilirubin Urine NEGATIVE NEGATIVE   Ketones, ur NEGATIVE NEGATIVE mg/dL   Protein, ur NEGATIVE NEGATIVE mg/dL   Nitrite NEGATIVE NEGATIVE   Leukocytes,Ua NEGATIVE NEGATIVE   RBC / HPF 21-50 0 - 5 RBC/hpf   WBC, UA 0-5 0 - 5 WBC/hpf   Bacteria, UA RARE (A) NONE SEEN   Squamous Epithelial / LPF 0-5 0 - 5    Comment: Performed at [facility], [address] [place], [place] [address]    CT Abdomen Pelvis Wo Contrast  Result Date: 01/31/2021 CLINICAL DATA:  Abdominal pain and weakness. Previous abdominal aortic aneurysm repair. EXAM: CT ABDOMEN AND PELVIS WITHOUT CONTRAST TECHNIQUE: Multidetector CT imaging of the abdomen and pelvis was performed following the standard protocol without IV contrast. COMPARISON:  01/15/2021. FINDINGS: Lower chest: Stable enlarged heart and atheromatous coronary artery calcifications. Minimal dependent atelectasis at both lung bases. Hepatobiliary: Normal appearing liver. Gallstones in the gallbladder, the largest measuring 1.7 cm in diameter. No gallbladder wall thickening or pericholecystic fluid. Pancreas: Mild to moderate diffuse pancreatic atrophy. Spleen: Small calcified granuloma.  Normal in size and shape. Adrenals/Urinary Tract: Normal appearing adrenal glands. Again demonstrated is moderate dilatation of the right renal collecting system to the level of the ureteropelvic junction. A heterogeneous lower pole mass is again demonstrated measuring 7.2 x 6.4 cm on image number 42/3, previously 6.8 x 6.5 cm. This is involving the UPJ. Normal appearing right kidney, ureters and urinary bladder. Stomach/Bowel: Multiple sigmoid colon diverticula without evidence of diverticulitis. Normal appearing appendix. Suggestion of mild poorly defined wall thickening in the region of the distal gastric antrum and proximal duodenum slight adjacent soft tissue stranding.  Vascular/Lymphatic: Stable aorto bi-iliac stent. Proximal aortic atheromatous calcifications. No enlarged lymph nodes. Reproductive: Prostate is unremarkable.  Bilateral hydroceles. Other: Small bilateral inguinal hernias containing fat. Small to moderate amount of free peritoneal air and small amount of free peritoneal fluid. Musculoskeletal: Lumbar and lower thoracic spine degenerative changes. IMPRESSION: 1. Free peritoneal air and free peritoneal fluid compatible with bowel perforation. 2. Suggestion of mild poorly defined wall thickening  and adjacent soft tissue stranding in the region of the distal gastric antrum and proximal duodenum, possibly indicating a ruptured gastric or duodenal ulcer as the source of perforation. 3. Again demonstrated large lower pole left renal mass most likely representing a primary renal cell carcinoma and causing moderate hydronephrosis due to obstruction at the UPJ. 4. Sigmoid diverticulosis. 5.  Calcific coronary artery and aortic atherosclerosis. 6. Bilateral hydroceles and small inguinal hernias containing fat. 7. Cholelithiasis without evidence cholecystitis. Electronically Signed   By: Claudie Revering M.D.   On: 01/28/2021 12:47   DG Chest Portable 1 View  Result Date: 01/18/2021 CLINICAL DATA:  Hypoxia.  Abdominal pain. EXAM: PORTABLE CHEST 1 VIEW COMPARISON:  01/15/2020 FINDINGS: Stable enlarged cardiac silhouette and tortuous aorta. Clear lungs. Lower thoracic spine degenerative changes. IMPRESSION: No acute abnormality.  Stable cardiomegaly. Electronically Signed   By: Claudie Revering M.D.   On: 01/13/2021 12:55    Review of Systems  Constitutional:  Positive for appetite change and fatigue.  HENT:  Positive for hearing loss.   Eyes: Negative.   Respiratory: Negative.    Cardiovascular: Negative.   Gastrointestinal:  Positive for abdominal pain. Negative for constipation and diarrhea.  Endocrine: Negative.   Genitourinary:        L renal CA  Musculoskeletal:  Negative.   Allergic/Immunologic: Negative.   Neurological: Negative.   Hematological: Negative.   Psychiatric/Behavioral: Negative.    Blood pressure (!) 173/81, pulse 83, temperature 99.3 F (37.4 C), temperature source Rectal, resp. rate 16, height 6\' 2"  (1.88 m), weight 81.6 kg, SpO2 94 %. Physical Exam Constitutional:      Appearance: He is well-developed.  HENT:     Head: Normocephalic.  Eyes:     Extraocular Movements: Extraocular movements intact.  Cardiovascular:     Rate and Rhythm: Normal rate and regular rhythm.     Heart sounds: Normal heart sounds.  Pulmonary:     Effort: Pulmonary effort is normal.     Breath sounds: Normal breath sounds.  Abdominal:     General: Abdomen is flat. Bowel sounds are decreased.     Palpations: There is no hepatomegaly.     Tenderness: There is generalized abdominal tenderness. There is guarding and rebound.  Skin:    General: Skin is warm.     Capillary Refill: Capillary refill takes 2 to 3 seconds.  Neurological:     Mental Status: He is alert.     Comments: HOH but oriented and F/C  Psychiatric:        Mood and Affect: Mood normal.    Assessment/Plan: Perforated ulcer - Zosyn IV, I have offered exploratory laparotomy and repair of perforated ulcer. He has significant risks of complications and mortality. I discussed the procedure, risks, and benefits with him and his daughters and he agrees to proceed. Will plan for the [facility] service to admit for management of his medical problems.  Zenovia Jarred 01/31/2021, 1:55 PM

## 2021-02-03 NOTE — Anesthesia Procedure Notes (Signed)
Procedure Name: Intubation Date/Time: 01/11/2021 3:39 PM Performed by: Suzy Bouchard, CRNA Pre-anesthesia Checklist: Patient identified, Emergency Drugs available, Suction available and Patient being monitored Patient Re-evaluated:Patient Re-evaluated prior to induction Oxygen Delivery Method: Circle system utilized Preoxygenation: Pre-oxygenation with 100% oxygen Induction Type: IV induction and Rapid sequence Laryngoscope Size: Miller and 2 Grade View: Grade I Tube type: Oral Tube size: 8.0 mm Number of attempts: 1 Airway Equipment and Method: Stylet and Oral airway Placement Confirmation: ETT inserted through vocal cords under direct vision, positive ETCO2 and breath sounds checked- equal and bilateral Secured at: 23 cm Tube secured with: Tape Dental Injury: Teeth and Oropharynx as per pre-operative assessment

## 2021-02-03 NOTE — Anesthesia Preprocedure Evaluation (Addendum)
Anesthesia Evaluation  Patient identified by MRN, date of birth, ID band Patient awake    Reviewed: Allergy & Precautions, NPO status , Patient's Chart, lab work & pertinent test results  History of Anesthesia Complications Negative for: history of anesthetic complications  Airway Mallampati: II   Neck ROM: Full    Dental  (+) Edentulous Upper, Edentulous Lower, Dental Advisory Given   Pulmonary shortness of breath, COPD, former smoker,     + decreased breath sounds      Cardiovascular hypertension, Pt. on medications + CAD, + Past MI, + Cardiac Stents and + Peripheral Vascular Disease   Rhythm:Regular     Neuro/Psych neg Seizures CVA negative psych ROS   GI/Hepatic BOWEL PERFORATION   Endo/Other  diabetes, Poorly Controlled, Insulin Dependent  Renal/GU ARF and CRFRenal diseaseLab Results      Component                Value               Date                      CREATININE               3.64 (H)            01/18/2021           Lab Results      Component                Value               Date                      K                        5.1                 01/29/2021                Musculoskeletal   Abdominal   Peds  Hematology Lab Results      Component                Value               Date                      WBC                      14.1 (H)            01/13/2021                HGB                      18.3 (H)            01/24/2021                HCT                      57.0 (H)            02/02/2021                MCV                      78.3 (L)  01/31/2021                PLT                      319                 01/05/2021              Anesthesia Other Findings   Reproductive/Obstetrics                            Anesthesia Physical Anesthesia Plan  ASA: 4 and emergent  Anesthesia Plan: General   Post-op Pain Management:    Induction: Intravenous,  Rapid sequence and Cricoid pressure planned  PONV Risk Score and Plan: 2 and Ondansetron and Treatment may vary due to age or medical condition  Airway Management Planned: Oral ETT  Additional Equipment:   Intra-op Plan:   Post-operative Plan: Possible Post-op intubation/ventilation  Informed Consent: I have reviewed the patients History and Physical, chart, labs and discussed the procedure including the risks, benefits and alternatives for the proposed anesthesia with the patient or authorized representative who has indicated his/her understanding and acceptance.     Dental advisory given  Plan Discussed with: CRNA and Anesthesiologist  Anesthesia Plan Comments:         Anesthesia Quick Evaluation

## 2021-02-03 NOTE — Progress Notes (Signed)
Called Dr. Ermalene Postin to evaluate patient. Patient began breathing more shallow and appeared air hungry with accessory use. Dr. Ermalene Postin at bedside to evaluate patient. Patient not as responsive with staff as earlier and following less commands.

## 2021-02-03 NOTE — ED Notes (Signed)
Pt SpO2 86-88% RA. Placed on 2 liters

## 2021-02-04 DIAGNOSIS — Z66 Do not resuscitate: Secondary | ICD-10-CM

## 2021-02-04 DIAGNOSIS — J9601 Acute respiratory failure with hypoxia: Secondary | ICD-10-CM | POA: Diagnosis not present

## 2021-02-04 DIAGNOSIS — Z87891 Personal history of nicotine dependence: Secondary | ICD-10-CM

## 2021-02-04 DIAGNOSIS — K631 Perforation of intestine (nontraumatic): Secondary | ICD-10-CM

## 2021-02-04 DIAGNOSIS — Z7189 Other specified counseling: Secondary | ICD-10-CM

## 2021-02-04 DIAGNOSIS — C649 Malignant neoplasm of unspecified kidney, except renal pelvis: Secondary | ICD-10-CM

## 2021-02-04 DIAGNOSIS — Z789 Other specified health status: Secondary | ICD-10-CM

## 2021-02-04 LAB — COMPREHENSIVE METABOLIC PANEL
ALT: 11 U/L (ref 0–44)
AST: 21 U/L (ref 15–41)
Albumin: 2.6 g/dL — ABNORMAL LOW (ref 3.5–5.0)
Alkaline Phosphatase: 31 U/L — ABNORMAL LOW (ref 38–126)
Anion gap: 11 (ref 5–15)
BUN: 44 mg/dL — ABNORMAL HIGH (ref 8–23)
CO2: 15 mmol/L — ABNORMAL LOW (ref 22–32)
Calcium: 9.1 mg/dL (ref 8.9–10.3)
Chloride: 103 mmol/L (ref 98–111)
Creatinine, Ser: 2.41 mg/dL — ABNORMAL HIGH (ref 0.61–1.24)
GFR, Estimated: 26 mL/min — ABNORMAL LOW (ref >60)
Glucose, Bld: 132 mg/dL — ABNORMAL HIGH (ref 70–99)
Potassium: 4.9 mmol/L (ref 3.5–5.1)
Sodium: 129 mmol/L — ABNORMAL LOW (ref 135–145)
Total Bilirubin: 2 mg/dL — ABNORMAL HIGH (ref 0.3–1.2)
Total Protein: 5.3 g/dL — ABNORMAL LOW (ref 6.5–8.1)

## 2021-02-04 LAB — CBC
HCT: 56 % — ABNORMAL HIGH (ref 39.0–52.0)
Hemoglobin: 17.6 g/dL — ABNORMAL HIGH (ref 13.0–17.0)
MCH: 24.7 pg — ABNORMAL LOW (ref 26.0–34.0)
MCHC: 31.4 g/dL (ref 30.0–36.0)
MCV: 78.5 fL — ABNORMAL LOW (ref 80.0–100.0)
Platelets: 308 10*3/uL (ref 150–400)
RBC: 7.13 MIL/uL — ABNORMAL HIGH (ref 4.22–5.81)
RDW: 20.2 % — ABNORMAL HIGH (ref 11.5–15.5)
WBC: 15.6 10*3/uL — ABNORMAL HIGH (ref 4.0–10.5)
nRBC: 0 % (ref 0.0–0.2)

## 2021-02-04 LAB — URINE CULTURE: Culture: <10000 — AB

## 2021-02-04 LAB — GLUCOSE, CAPILLARY
Glucose-Capillary: 322 mg/dL — ABNORMAL HIGH (ref 70–99)
Glucose-Capillary: 126 mg/dL — ABNORMAL HIGH (ref 70–99)
Glucose-Capillary: 150 mg/dL — ABNORMAL HIGH (ref 70–99)
Glucose-Capillary: 137 mg/dL — ABNORMAL HIGH (ref 70–99)
Glucose-Capillary: 109 mg/dL — ABNORMAL HIGH (ref 70–99)
Glucose-Capillary: 95 mg/dL (ref 70–99)

## 2021-02-04 LAB — MRSA NEXT GEN BY PCR, NASAL: MRSA by PCR Next Gen: NOT DETECTED

## 2021-02-04 LAB — HEMOGLOBIN A1C
Hgb A1c MFr Bld: 7 % — ABNORMAL HIGH (ref 4.8–5.6)
Mean Plasma Glucose: 154.2 mg/dL

## 2021-02-04 MED ORDER — FENTANYL CITRATE (PF) 100 MCG/2ML IJ SOLN
25.0000 ug | INTRAMUSCULAR | Status: DC | PRN
  Administered 2021-02-04: 50 ug via INTRAVENOUS
  Administered 2021-02-04: 100 ug via INTRAVENOUS
  Filled 2021-02-04: qty 2

## 2021-02-04 MED ORDER — PANTOPRAZOLE SODIUM 40 MG IV SOLR
40.0000 mg | Freq: Two times a day (BID) | INTRAVENOUS | Status: DC
  Administered 2021-02-04 (×2): 40 mg via INTRAVENOUS
  Filled 2021-02-04 (×2): qty 40

## 2021-02-04 MED ORDER — INSULIN ASPART 100 UNIT/ML IJ SOLN
0.0000 [IU] | Freq: Four times a day (QID) | INTRAMUSCULAR | Status: DC
  Administered 2021-02-04 – 2021-02-06 (×4): 1 [IU] via SUBCUTANEOUS
  Administered 2021-02-07 (×2): 2 [IU] via SUBCUTANEOUS
  Administered 2021-02-07: 1 [IU] via SUBCUTANEOUS
  Administered 2021-02-07: 2 [IU] via SUBCUTANEOUS
  Administered 2021-02-08: 1 [IU] via SUBCUTANEOUS
  Administered 2021-02-08: 2 [IU] via SUBCUTANEOUS

## 2021-02-04 MED ORDER — CHLORHEXIDINE GLUCONATE CLOTH 2 % EX PADS
6.0000 | MEDICATED_PAD | Freq: Every day | CUTANEOUS | Status: DC
  Administered 2021-02-04 – 2021-02-07 (×4): 6 via TOPICAL

## 2021-02-04 MED ORDER — FENTANYL CITRATE (PF) 100 MCG/2ML IJ SOLN
25.0000 ug | INTRAMUSCULAR | Status: AC | PRN
  Administered 2021-02-04 – 2021-02-05 (×3): 25 ug via INTRAVENOUS
  Filled 2021-02-04 (×4): qty 2

## 2021-02-04 MED ORDER — PIPERACILLIN-TAZOBACTAM 3.375 G IVPB
3.3750 g | Freq: Three times a day (TID) | INTRAVENOUS | Status: DC
  Administered 2021-02-04 – 2021-02-08 (×12): 3.375 g via INTRAVENOUS
  Filled 2021-02-04 (×12): qty 50

## 2021-02-04 NOTE — Progress Notes (Signed)
Pharmacy Antibiotic Note  Isaiah Jackson is a 87 y.o. male admitted on 01/14/2021 with bowel perforation s/p ex-lap with repair of perforated ulcer.  Pharmacy has been consulted for Zosyn dosing.  Renal function improving, afebrile, WBC 15.6.  Plan: Change Zosyn to EID 3.375gm IV Q8H Monitor renal fxn, clinical progress  Height: 6' (182.9 cm) Weight: 85.3 kg (188 lb) IBW/kg (Calculated) : 77.6  Temp (24hrs), Avg:97.7 F (36.5 C), Min:96.3 F (35.7 C), Max:99.3 F (37.4 C)  Recent Labs  Lab 01/25/2021 1032 01/21/2021 1639 02/04/21 0153 02/04/21 0410  WBC 15.4* 14.1* 15.6*  --   CREATININE 3.64* 2.75*  --  2.41*     Estimated Creatinine Clearance: 24.6 mL/min (A) (by C-G formula based on SCr of 2.41 mg/dL (H)).    Allergies  Allergen Reactions   Bee Venom    Capoten [Captopril]     Fatigue/depression   Lamisil [Terbinafine] Rash   Zosyn 7/30 >>   7/31 MRS PCR - negative  Kamarion Zagami D. Mina Marble, PharmD, BCPS, [place] 02/04/2021, 10:12 AM

## 2021-02-04 NOTE — Procedures (Signed)
Extubation Procedure Note  Patient Details:   Name: Isaiah Jackson DOB: 14-Oct-1933 MRN: [id number]   Airway Documentation:    Vent end date: 02/04/21 Vent end time: 1203   Evaluation  O2 sats: stable throughout Complications: No apparent complications Patient did tolerate procedure well. Bilateral Breath Sounds: Clear   Pt extubated to BIPAP 10/5 per MD order. Pt had positive cuff leak prior to extubation. No stridor noted. Pt able to voice his name. RT to continue to monitor.  Vilinda Blanks 02/04/2021, 12:03 PM

## 2021-02-04 NOTE — Progress Notes (Signed)
Pt placed on PSV 5/5 per wean protocol. Pt is tolerating well at this time. RN made aware. RT to continue to monitor.

## 2021-02-04 NOTE — Progress Notes (Signed)
eLink Physician-Brief Progress Note Patient Name: Isaiah Jackson DOB: 1933/11/08 MRN: [id number]   Date of Service  02/04/2021  HPI/Events of Note  87 year old man with post op respiratory failure. Needed re intubation in PACU and brought to ICU. Continued DNR status. He had ex lap for perforated ulcer.   eICU Interventions  Seen on camera On vent PRVC 40% fio2, peep 5, RR 20, VT 620 - this is 8 cc/kb IBW for him PCCM aware and admitting. Bedside vitals are stable. Call us if needed     Intervention Category Major Interventions: Respiratory failure - evaluation and management Evaluation Type: New Patient Evaluation  Margaretmary Lombard 02/04/2021, 12:56 AM

## 2021-02-04 NOTE — Consult Note (Signed)
NAME:  Isaiah Jackson, MRN:  [id number], DOB:  05/07/1934, LOS: 1 ADMISSION DATE:  01/21/2021, CONSULTATION DATE:  7/30.22 REFERRING MD:  Claudie Fisherman , CHIEF COMPLAINT:  respiratory failure   History of Present Illness:  87 yo M PMH COPD HTN CKD IV, renal cancer (declined tx) HLD, CAD CVA on plavix, DNR status who presented to [facility] 7/30 w CC abdominal pain, worsening x 6-7 days with associated vomiting. Found to have a bowel perf, taken emergently to OR with Dr. Grandville Silos for ex lap and repair.   Extubated after the case, however developed respiratory distress in PACU and was intubated in PACU. Reintubation in the setting of the patient's DNR status was discussed between anesthesia and the patient's daughter-- family was in agreement with re-intubation in this situation, with ongoing DNR status in case of arrest.   CCM consulted in this setting   Pertinent  Medical History    Significant Hospital Events: Including procedures, antibiotic start and stop dates in addition to other pertinent events   7/30 admitted to [facility] w bowel perf, OR with thompson for ex lap, repair, extubated after case, reintubated in  PACU, ccm consult  Interim History / Subjective:  On fentanyl and precedex this morning. Some bradycardia and hypotension on precedex.    Objective   Blood pressure (!) 127/56, pulse (!) 55, temperature (!) 96.8 F (36 C), temperature source Axillary, resp. rate (!) 22, height 6' (1.829 m), weight 85.3 kg, SpO2 99 %.    Vent Mode: BIPAP FiO2 (%):  [30 %-40 %] 40 % Set Rate:  [12 bmp-20 bmp] 12 bmp Vt Set:  [620 mL] 620 mL PEEP:  [5 cmH20] 5 cmH20 Pressure Support:  [5 cmH20] 5 cmH20 Plateau Pressure:  [15 cmH20-20 cmH20] 20 cmH20   Intake/Output Summary (Last 24 hours) at 02/04/2021 1244 Last data filed at 02/04/2021 1208 Gross per 24 hour  Intake 3628.03 ml  Output 1520 ml  Net 2108.03 ml   Filed Weights   01/07/2021 1113 01/26/2021 1443  Weight: 81.6 kg 85.3 kg     Examination: General: elderly chronically ill appearing man, ETT to vent HENT: NCAT ETT secure anicteric sclera  Lungs: clear, no wheezes or crackles Cardiovascular: bradycardic, regular, no mrg Abdomen: soft round. JP drain, dressings clean and dry Extremities: no acute abnormalities no cyanosis no clubbing  Neuro: RASS -1 GU: foley   Resolved Hospital Problem list     Assessment & Plan:   Acute respiratory failure with hypoxia requiring intubation ?emergence related v more a reflection of pts clinical condition Hx COPD  P - SBT and SAT this morning went ok  - extubate to bipap given copd history, would keep this brief given recent abdominal surgery - titrate down to nasal cannula goal sats over 90%.   Ex lap with repair and patch of perforated pyloric ulcer POD 1  P -per CCS - BID PPI - pain control -Zosyn -IVF  CKD IV Renal cancer -pt has declined tx for cancer and has expressed that he would never want dialysis if renal function deteriorated to that extent -IVF   Hx CVA on plavix -hold anti plt  HTN HLD -holding home meds  -PRN IV hydral   IDDM -SSI  DNR Status / [place] -DNR on admit. Pt has previously expressed desire not to pursue tx for cancer, or measures such as dialysis  Dispo - if does ok this afternoon can send to [facility] to assume care on 8/1  Best  Practice (right click and "Reselect all SmartList Selections" daily)   Diet/type: NPO advance diet per CCS recs.  DVT prophylaxis: SCD GI prophylaxis: PPI Lines: N/A Foley:  Yes, and it is still needed Code Status:  DNR Last date of multidisciplinary goals of care discussion [pending]  Labs   CBC: Recent Labs  Lab 01/22/2021 1032 01/13/2021 1639 02/04/21 0153  WBC 15.4* 14.1* 15.6*  NEUTROABS  --  12.6*  --   HGB 20.4* 18.3* 17.6*  HCT 65.2* 57.0* 56.0*  MCV 78.6* 78.3* 78.5*  PLT 385 319 144    Basic Metabolic Panel: Recent Labs  Lab 01/23/2021 1032 01/06/2021 1639 02/04/21 0410  NA  123* 129* 129*  K 5.1 5.5* 4.9  CL 89* 98 103  CO2 18* 17* 15*  GLUCOSE 311* 262* 132*  BUN 53* 50* 44*  CREATININE 3.64* 2.75* 2.41*  CALCIUM 10.7* 9.5 9.1   GFR: Estimated Creatinine Clearance: 24.6 mL/min (A) (by C-G formula based on SCr of 2.41 mg/dL (H)). Recent Labs  Lab 01/16/2021 1032 02/04/2021 1639 02/04/21 0153  WBC 15.4* 14.1* 15.6*    Liver Function Tests: Recent Labs  Lab 01/27/2021 1032 02/04/21 0410  AST 27 21  ALT 14 11  ALKPHOS 52 31*  BILITOT 1.9* 2.0*  PROT 7.5 5.3*  ALBUMIN 3.3* 2.6*   Recent Labs  Lab 02/02/2021 1032  LIPASE 54*   No results for input(s): AMMONIA in the last 168 hours.  ABG    Component Value Date/Time   PHART 7.217 (L) 02/01/2021 2120   PCO2ART 51.5 (H) 01/09/2021 2120   PO2ART 119 (H) 01/06/2021 2120   HCO3 20.2 01/28/2021 2120   ACIDBASEDEF 6.3 (H) 01/26/2021 2120   O2SAT 97.3 01/09/2021 2120     Coagulation Profile: No results for input(s): INR, PROTIME in the last 168 hours.  Cardiac Enzymes: No results for input(s): CKTOTAL, CKMB, CKMBINDEX, TROPONINI in the last 168 hours.  HbA1C: Hgb A1c MFr Bld  Date/Time Value Ref Range Status  02/04/2021 01:53 AM 7.0 (H) 4.8 - 5.6 % Final    Comment:    (NOTE) Pre diabetes:          5.7%-6.4%  Diabetes:              >6.4%  Glycemic control for   <7.0% adults with diabetes   10/09/2020 09:55 AM 7.3 (H) <5.7 % of total Hgb Final    Comment:    For someone without known diabetes, a hemoglobin A1c value of 6.5% or greater indicates that they may have  diabetes and this should be confirmed with a follow-up  test. . For someone with known diabetes, a value <7% indicates  that their diabetes is well controlled and a value  greater than or equal to 7% indicates suboptimal  control. A1c targets should be individualized based on  duration of diabetes, age, comorbid conditions, and  other considerations. . Currently, no consensus exists regarding use of hemoglobin A1c  for diagnosis of diabetes for children. .     CBG: Recent Labs  Lab 01/14/2021 2318 01/21/2021 2354 02/04/21 0315 02/04/21 0809 02/04/21 1137  GLUCAP 129* 124* 126* 150* 137*    Critical care time: 35 min     The patient is critically ill due to respiratory failure, intestinal perforation.  Critical care was necessary to treat or prevent imminent or life-threatening deterioration.  Critical care was time spent personally by me on the following activities: development of treatment plan with patient and/or surrogate as  well as nursing, discussions with consultants, evaluation of patient's response to treatment, examination of patient, obtaining history from patient or surrogate, ordering and performing treatments and interventions, ordering and review of laboratory studies, ordering and review of radiographic studies, pulse oximetry, re-evaluation of patient's condition and participation in multidisciplinary rounds.   Critical Care Time devoted to patient care services described in this note is 35 minutes. This time reflects time of care of this [place] . This critical care time does not reflect separately billable procedures or procedure time, teaching time or supervisory time of PA/NP/Med student/Med Resident etc but could involve care discussion time.       Spero Geralds [facility] 02/04/2021 12:45 PM  Pager: see AMION  If no response to pager , please call critical care on call (see AMION) until 7pm After 7:00 pm call Elink

## 2021-02-04 NOTE — Progress Notes (Signed)
Patient ID: Isaiah Jackson, male   DOB: November 04, 1933, 87 y.o.   MRN: [id number] 1 Day Post-Op   Subjective: On vent ROS negative except as listed above. Objective: Vital signs in last 24 hours: Temp:  [97 F (36.1 C)-99.3 F (37.4 C)] 97.6 F (36.4 C) (07/31 0400) Pulse Rate:  [50-95] 55 (07/31 0743) Resp:  [14-31] 19 (07/31 0743) BP: (78-182)/(43-93) 117/56 (07/31 0600) SpO2:  [82 %-100 %] 95 % (07/31 0743) FiO2 (%):  [30 %-40 %] 30 % (07/31 0743) Weight:  [81.6 kg-85.3 kg] 85.3 kg (07/30 1443) Last BM Date: 01/29/21  Intake/Output from previous day: 07/30 0701 - 07/31 0700 In: 3394 [I.V.:1794; IV Piggyback:1600] Out: 1300 [Urine:1065; Drains:135; Blood:50] Intake/Output this shift: No intake/output data recorded.  General appearance: cooperative Resp: clear to auscultation bilaterally Cardio: regular rate and rhythm GI: wound OK, wet to dry, JP SS  Lab Results: CBC  Recent Labs    01/22/2021 1639 02/04/21 0153  WBC 14.1* 15.6*  HGB 18.3* 17.6*  HCT 57.0* 56.0*  PLT 319 308   BMET Recent Labs    01/10/2021 1639 02/04/21 0410  NA 129* 129*  K 5.5* 4.9  CL 98 103  CO2 17* 15*  GLUCOSE 262* 132*  BUN 50* 44*  CREATININE 2.75* 2.41*  CALCIUM 9.5 9.1   PT/INR No results for input(s): LABPROT, INR in the last 72 hours. ABG Recent Labs    02/02/2021 2120  PHART 7.217*  HCO3 20.2    Studies/Results:   Anti-infectives: Anti-infectives (From admission, onward)    Start     Dose/Rate Route Frequency Ordered Stop   01/30/2021 1345  piperacillin-tazobactam (ZOSYN) IVPB 2.25 g        2.25 g 100 mL/hr over 30 Minutes Intravenous Every 8 hours 01/28/2021 1341         Assessment/Plan: S/P repair and patch perforated pyloric ulcer by Dr. Grandville Silos 7/30 - NGT to LIWS, protonix BID Acute hypoxic ventilator dependent respiratory failure - per CCM, currently weaning 5/5, RN reports GOC discussion AI - per primary   LOS: 1 day    Georganna Skeans, MD, MPH,  FACS Trauma & General Surgery Use [email or web address] to contact on call provider  02/04/2021

## 2021-02-04 NOTE — Progress Notes (Signed)
Patient attempted self extubation. Patient had mitts on at the time. CCM NP Eliseo Gum was on the unit and able to come to the bedside. Bilateral wrist restraints ordered and  started, prn fentanyl given.

## 2021-02-04 NOTE — Progress Notes (Signed)
Patient hypotensive and bradycardic.  Alerted CCM MD to this.  Patient was assessed by MD. Will continue to monitor and act accordingly.

## 2021-02-04 NOTE — Progress Notes (Signed)
PT Cancellation Note  Patient Details Name: Isaiah Jackson MRN: [id number] DOB: 1934/03/25   Cancelled Treatment:    Reason Eval/Treat Not Completed: Patient not medically ready (weaning on vent).  Wyona Almas, PT, DPT Acute Rehabilitation Services Pager [phone] Office [phone]    Deno Etienne 02/04/2021, 8:50 AM

## 2021-02-05 ENCOUNTER — Encounter (HOSPITAL_COMMUNITY): Payer: Self-pay | Admitting: General Surgery

## 2021-02-05 DIAGNOSIS — K631 Perforation of intestine (nontraumatic): Secondary | ICD-10-CM | POA: Diagnosis not present

## 2021-02-05 LAB — CBC WITH DIFFERENTIAL/PLATELET
Abs Immature Granulocytes: 0.06 10*3/uL (ref 0.00–0.07)
Basophils Absolute: 0 10*3/uL (ref 0.0–0.1)
Basophils Relative: 0 %
Eosinophils Absolute: 0 10*3/uL (ref 0.0–0.5)
Eosinophils Relative: 0 %
HCT: 55 % — ABNORMAL HIGH (ref 39.0–52.0)
Hemoglobin: 17 g/dL (ref 13.0–17.0)
Immature Granulocytes: 1 %
Lymphocytes Relative: 7 %
Lymphs Abs: 0.9 10*3/uL (ref 0.7–4.0)
MCH: 24.6 pg — ABNORMAL LOW (ref 26.0–34.0)
MCHC: 30.9 g/dL (ref 30.0–36.0)
MCV: 79.7 fL — ABNORMAL LOW (ref 80.0–100.0)
Monocytes Absolute: 0.8 10*3/uL (ref 0.1–1.0)
Monocytes Relative: 6 %
Neutro Abs: 10.9 10*3/uL — ABNORMAL HIGH (ref 1.7–7.7)
Neutrophils Relative %: 86 %
Platelets: 292 10*3/uL (ref 150–400)
RBC: 6.9 MIL/uL — ABNORMAL HIGH (ref 4.22–5.81)
RDW: 20.8 % — ABNORMAL HIGH (ref 11.5–15.5)
WBC: 12.7 10*3/uL — ABNORMAL HIGH (ref 4.0–10.5)
nRBC: 0 % (ref 0.0–0.2)

## 2021-02-05 LAB — IRON AND TIBC
Iron: 8 ug/dL — ABNORMAL LOW (ref 45–182)
Saturation Ratios: 4 % — ABNORMAL LOW (ref 17.9–39.5)
TIBC: 190 ug/dL — ABNORMAL LOW (ref 250–450)
UIBC: 182 ug/dL

## 2021-02-05 LAB — BASIC METABOLIC PANEL
Anion gap: 12 (ref 5–15)
BUN: 44 mg/dL — ABNORMAL HIGH (ref 8–23)
CO2: 18 mmol/L — ABNORMAL LOW (ref 22–32)
Calcium: 9.4 mg/dL (ref 8.9–10.3)
Chloride: 103 mmol/L (ref 98–111)
Creatinine, Ser: 2.39 mg/dL — ABNORMAL HIGH (ref 0.61–1.24)
GFR, Estimated: 26 mL/min — ABNORMAL LOW (ref >60)
Glucose, Bld: 91 mg/dL (ref 70–99)
Potassium: 4.5 mmol/L (ref 3.5–5.1)
Sodium: 133 mmol/L — ABNORMAL LOW (ref 135–145)

## 2021-02-05 LAB — BRAIN NATRIURETIC PEPTIDE: B Natriuretic Peptide: 793 pg/mL — ABNORMAL HIGH (ref 0.0–100.0)

## 2021-02-05 LAB — TYPE AND SCREEN
ABO/RH(D): A POS
Antibody Screen: NEGATIVE

## 2021-02-05 LAB — GLUCOSE, CAPILLARY
Glucose-Capillary: 117 mg/dL — ABNORMAL HIGH (ref 70–99)
Glucose-Capillary: 121 mg/dL — ABNORMAL HIGH (ref 70–99)
Glucose-Capillary: 121 mg/dL — ABNORMAL HIGH (ref 70–99)

## 2021-02-05 LAB — LACTIC ACID, PLASMA
Lactic Acid, Venous: 1 mmol/L (ref 0.5–1.9)
Lactic Acid, Venous: 1.1 mmol/L (ref 0.5–1.9)

## 2021-02-05 LAB — FERRITIN: Ferritin: 135 ng/mL (ref 24–336)

## 2021-02-05 LAB — PROCALCITONIN: Procalcitonin: 16.72 ng/mL

## 2021-02-05 LAB — PHOSPHORUS: Phosphorus: 3.7 mg/dL (ref 2.5–4.6)

## 2021-02-05 LAB — MAGNESIUM: Magnesium: 2.1 mg/dL (ref 1.7–2.4)

## 2021-02-05 MED ORDER — PANTOPRAZOLE SODIUM 40 MG IV SOLR
40.0000 mg | Freq: Two times a day (BID) | INTRAVENOUS | Status: DC
  Administered 2021-02-05 – 2021-02-07 (×6): 40 mg via INTRAVENOUS
  Filled 2021-02-05 (×6): qty 40

## 2021-02-05 MED ORDER — ALBUMIN HUMAN 5 % IV SOLN
25.0000 g | Freq: Once | INTRAVENOUS | Status: AC
  Administered 2021-02-05: 25 g via INTRAVENOUS
  Filled 2021-02-05: qty 500

## 2021-02-05 MED ORDER — AMIODARONE HCL IN DEXTROSE 360-4.14 MG/200ML-% IV SOLN
60.0000 mg/h | INTRAVENOUS | Status: DC
  Administered 2021-02-05 (×2): 60 mg/h via INTRAVENOUS
  Filled 2021-02-05 (×2): qty 200

## 2021-02-05 MED ORDER — AMIODARONE LOAD VIA INFUSION
150.0000 mg | Freq: Once | INTRAVENOUS | Status: AC
  Administered 2021-02-05: 150 mg via INTRAVENOUS
  Filled 2021-02-05: qty 83.34

## 2021-02-05 MED ORDER — SIMVASTATIN 20 MG PO TABS: 20.0000 mg | ORAL_TABLET | Freq: Every day | ORAL | Status: DC

## 2021-02-05 MED ORDER — METOPROLOL TARTRATE 5 MG/5ML IV SOLN
5.0000 mg | Freq: Four times a day (QID) | INTRAVENOUS | Status: DC
  Administered 2021-02-05 – 2021-02-08 (×10): 5 mg via INTRAVENOUS
  Filled 2021-02-05 (×10): qty 5

## 2021-02-05 MED ORDER — HYDROCODONE-ACETAMINOPHEN 7.5-325 MG PO TABS
1.0000 | ORAL_TABLET | Freq: Four times a day (QID) | ORAL | Status: DC | PRN
Start: 2021-02-05 — End: 2021-02-08
  Administered 2021-02-05 (×3): 1
  Filled 2021-02-05 (×3): qty 1

## 2021-02-05 MED ORDER — AMIODARONE HCL IN DEXTROSE 360-4.14 MG/200ML-% IV SOLN
30.0000 mg/h | INTRAVENOUS | Status: DC
  Administered 2021-02-05 – 2021-02-08 (×7): 30 mg/h via INTRAVENOUS
  Filled 2021-02-05 (×7): qty 200

## 2021-02-05 NOTE — Progress Notes (Signed)
Patient ID: PEDRAM GOODCHILD, male   DOB: 09-01-1933, 87 y.o.   MRN: [id number] 2 Days Post-Op   Subjective: Extubated.  Difficulty talking.  He is able to barely get out he is doing "ok".  Denies SOB.  Denies much abdominal pain.    ROS negative except as listed above. Objective: Vital signs in last 24 hours: Temp:  [96.8 F (36 C)-99.1 F (37.3 C)] 98.9 F (37.2 C) (08/01 0800) Pulse Rate:  [53-137] 115 (08/01 0800) Resp:  [18-31] 19 (08/01 0800) BP: (83-154)/(47-103) 124/69 (08/01 0800) SpO2:  [95 %-100 %] 97 % (08/01 0800) FiO2 (%):  [40 %] 40 % (07/31 1205) Last BM Date: 01/29/21  Intake/Output from previous day: 07/31 0701 - 08/01 0700 In: 1730.6 [I.V.:1088.9; IV Piggyback:641.7] Out: 1045 [Urine:905; Drains:140] Intake/Output this shift: No intake/output data recorded.  General appearance: cooperative Resp: clear to auscultation bilaterally Cardio: tachy GI: wound OK, wet to dry, JP SS, NGT with minimal output.  Appropriately tender  Lab Results: CBC  Recent Labs    02/04/21 0153 02/05/21 0825  WBC 15.6* 12.7*  HGB 17.6* 17.0  HCT 56.0* 55.0*  PLT 308 292   BMET Recent Labs    02/04/21 0410 02/05/21 0033  NA 129* 133*  K 4.9 4.5  CL 103 103  CO2 15* 18*  GLUCOSE 132* 91  BUN 44* 44*  CREATININE 2.41* 2.39*  CALCIUM 9.1 9.4   PT/INR No results for input(s): LABPROT, INR in the last 72 hours. ABG Recent Labs    01/25/2021 2120  PHART 7.217*  HCO3 20.2    Studies/Results:   Anti-infectives: Anti-infectives (From admission, onward)    Start     Dose/Rate Route Frequency Ordered Stop   02/04/21 1100  piperacillin-tazobactam (ZOSYN) IVPB 3.375 g        3.375 g 12.5 mL/hr over 240 Minutes Intravenous Every 8 hours 02/04/21 1013     02/01/2021 1345  piperacillin-tazobactam (ZOSYN) IVPB 2.25 g  Status:  Discontinued        2.25 g 100 mL/hr over 30 Minutes Intravenous Every 8 hours 01/05/2021 1341 02/04/21 1013       Assessment/Plan: POD 2, S/P  repair and patch perforated pyloric ulcer by Dr. Grandville Silos 7/30  -NGT to LIWS, may consider UGI in the next couple days vs no study. - protonix BID -cont zosyn for likely 5 days post op -NS WD dressing changes BID -cont JP drain for now  FEN - NGT/IVFs, NPO VTE - heparin ID - zosyn  Acute hypoxic ventilator dependent respiratory failure -extubated AI - per primary COPD CAD DM HLD HTN RCC   LOS: 2 days    Georganna Skeans, MD, MPH, FACS Trauma & General Surgery Use [email or web address] to contact on call provider  02/05/2021

## 2021-02-05 NOTE — Progress Notes (Signed)
eLink Physician-Brief Progress Note Patient Name: Isaiah Jackson DOB: [birth year] MRN: [id number]   Date of Service  02/05/2021  HPI/Events of Note  Notified of afib 140s BP 110/70  HR now 130s after Fentanyl 25 mg IV given as also complaining of abdominal pain  No significant NG drain output BMP without significant electrolyte abnormality. No Mg and Phos determination  eICU Interventions  New onset afib RVR, likely pain related Ordered Norco prn Check BMP Mg and Phos tonight. Elink to be informed once resulted Amiodarone 150 mg IV then drip Will not start anticoagulation given recent surgery Discussed with bedside RN        Judd Lien 02/05/2021, 12:30 AM

## 2021-02-05 NOTE — Progress Notes (Signed)
  Amiodarone Drug - Drug Interaction Consult Note  Recommendations: Simvastatin 40 mg dose was reduced to 20 mg Monitor Zofran use  Amiodarone is metabolized by the cytochrome P450 system and therefore has the potential to cause many drug interactions. Amiodarone has an average plasma half-life of 50 days (range 20 to 100 days).   There is potential for drug interactions to occur several weeks or months after stopping treatment and the onset of drug interactions may be slow after initiating amiodarone.   [x]  Statins: Increased risk of myopathy. Simvastatin- restrict dose to 20mg  daily. Other statins: counsel patients to report any muscle pain or weakness immediately.  []  Anticoagulants: Amiodarone can increase anticoagulant effect. Consider warfarin dose reduction. Patients should be monitored closely and the dose of anticoagulant altered accordingly, remembering that amiodarone levels take several weeks to stabilize.  []  Antiepileptics: Amiodarone can increase plasma concentration of phenytoin, the dose should be reduced. Note that small changes in phenytoin dose can result in large changes in levels. Monitor patient and counsel on signs of toxicity.  []  Beta blockers: increased risk of bradycardia, AV block and myocardial depression. Sotalol - avoid concomitant use.  []   Calcium channel blockers (diltiazem and verapamil): increased risk of bradycardia, AV block and myocardial depression.  []   Cyclosporine: Amiodarone increases levels of cyclosporine. Reduced dose of cyclosporine is recommended.  []  Digoxin dose should be halved when amiodarone is started.  []  Diuretics: increased risk of cardiotoxicity if hypokalemia occurs.  []  Oral hypoglycemic agents (glyburide, glipizide, glimepiride): increased risk of hypoglycemia. Patient's glucose levels should be monitored closely when initiating amiodarone therapy.   [x]  Drugs that prolong the QT interval:  Torsades de pointes risk may be  increased with concurrent use - avoid if possible.  Monitor QTc, also keep magnesium/potassium WNL if concurrent therapy can't be avoided.  Antibiotics: e.g. fluoroquinolones, erythromycin.  Antiarrhythmics: e.g. quinidine, procainamide, disopyramide, sotalol.  Antipsychotics: e.g. phenothiazines, haloperidol.   Lithium, tricyclic antidepressants, and methadone. Thank You,  Narda Bonds  02/05/2021 12:39 AM

## 2021-02-05 NOTE — Progress Notes (Signed)
Pt tachycardic into the 140s, pt states he in in pain, PRN fentanyl given, 12-lead EKG obtained results showing A Fib RVR E-link notified, awaiting orders.

## 2021-02-05 NOTE — Progress Notes (Signed)
[facility] Progress Note  Patient: Isaiah Jackson    BJS:[id number]  DOA: 01/19/2021     Date of Service: the patient was seen and examined on 02/05/2021  Brief hospital course: Past medical history of COPD, HTN, CKD 4, renal cancer, HLD, CAD, CVA on Plavix, DNR on admission.  Presents with complaints of abdominal pain and nausea and vomiting.  Found to have bowel perforation. SP OR with Dr. Grandville Silos for exploratory laparotomy and patch repair. Currently plan is current care.  Subjective: [name] muffled.  No acute complaint.  No nausea no vomiting.  No fever no chills.  Wants to eat or drink something.  Assessment and Plan: 1.  Acute respiratory failure with hypoxia requiring intubation Postprocedure. History of COPD. Extubated right now. Remains DNR.  2.  Recent exploratory laparotomy with patch repair. Perforated peptic ulcer. Management per general surgery. Continue PPI twice daily. Continue with IV Zosyn. Continue with IV fluid. Currently NPO.  3.  Chronic kidney disease stage IV. History of renal cancer. Patient has declined hemodialysis in the past.  Also has declined therapy for cancer. Will monitor for now.  4.  History of CVA on Plavix Currently holding antiplatelet medication.  5.  A. fib with RVR. Essential hypertension. HLD. Currently on amiodarone drip. Will continue. Not a good candidate for anticoagulation given recent history and admission for peptic ulcer perforation. Add Lopressor with holding parameters.   6.  Type 2 diabetes mellitus. On every 6 hours insulin sliding scale insulin.  7.  Goals of care conversation. Palliative care team consulted for further discussion regarding goals of care.  8.  Iron deficiency. Patient does not have anemia and as a matter fact appears to have polycythemia. Iron level still significantly low. Will perform further work-up and discussed with hematology.  Scheduled Meds:  chlorhexidine gluconate (MEDLINE  KIT)  15 mL Mouth Rinse BID   Chlorhexidine Gluconate Cloth  6 each Topical Daily   heparin  5,000 Units Subcutaneous Q12H   insulin aspart  0-9 Units Subcutaneous Q6H   metoprolol tartrate  5 mg Intravenous Q6H   pantoprazole (PROTONIX) IV  40 mg Intravenous Q12H   Continuous Infusions:  amiodarone 30 mg/hr (02/05/21 1600)   piperacillin-tazobactam (ZOSYN)  IV 3.375 g (02/05/21 1818)   PRN Meds: acetaminophen **OR** acetaminophen, hydrALAZINE, HYDROcodone-acetaminophen, ondansetron (ZOFRAN) IV  Body mass index is 25.5 kg/m.        DVT Prophylaxis:   heparin injection 5,000 Units Start: 02/04/21 1000 SCDs Start: 01/15/2021 1553    Advance goals of care discussion: Pt is DNR.  Family Communication: no family was present at bedside, at the time of interview.   Data Reviewed: I have personally reviewed and interpreted daily labs, tele strips, imaging. Sodium 133.  Potassium stable.  Serum creatinine improving.  Mild metabolic acidosis.  BNP mildly elevated.  Iron level low.  Hemoglobin stable.  WBC improving.  Physical Exam:  General: Appear in moderate distress, no Rash; Oral Mucosa Clear, moist. no Abnormal Neck Mass Or lumps, Conjunctiva normal  Cardiovascular: S1 and S2 Present, no Murmur, Respiratory: good respiratory effort, Bilateral Air entry present and CTA, no Crackles, no wheezes Abdomen: Bowel Sound present, Soft and no tenderness Extremities: no Pedal edema Neurology: alert and oriented to place and person affect flat in affect. no new focal deficit Gait not checked due to patient safety concerns  Vitals:   02/05/21 1500 02/05/21 1600 02/05/21 1700 02/05/21 1800  BP: (!) 145/72 130/74 101/73 104/70  Pulse: (!) 105 (!)  101 (!) 101 99  Resp: (!) 25 (!) 24 17 (!) 24  Temp:      TempSrc:      SpO2: 96% 96% 96% 96%  Weight:      Height:        Disposition:  Status is: Inpatient  Remains inpatient appropriate because:IV treatments appropriate due to  intensity of illness or inability to take PO and Inpatient level of care appropriate due to severity of illness  Dispo: The patient is from: Home              Anticipated d/c is to: SNF              Patient currently is not medically stable to d/c.   Difficult to place patient No  Time spent: 35 minutes. I reviewed all nursing notes, pharmacy notes, vitals, pertinent old records. I have discussed plan of care as described above with RN.  Author: Berle Mull, MD [facility] 02/05/2021 7:53 PM  To reach On-call, see care teams to locate the attending and reach out via [email or web address]. Between 7PM-7AM, please contact night-coverage If you still have difficulty reaching the attending provider, please page the [facility] (Director on Call) for [facility] on amion for assistance.

## 2021-02-05 NOTE — Progress Notes (Signed)
[place] Progress Note Patient Name: Isaiah Jackson DOB: 1933-07-20 MRN: [id number]   Date of Service  02/05/2021  HPI/Events of Note  Notified that patient still in afib HR now 49s. Inquiry if fluids shouldl be given.  Fluid positive 2liters  eICU Interventions  Will give a trial of albumin 5%, 25 g     Intervention Category Intermediate Interventions: Arrhythmia - evaluation and management  Shona Needles [place] Zapien 02/05/2021, 2:50 AM

## 2021-02-05 NOTE — Evaluation (Signed)
Physical Therapy Evaluation Patient Details Name: Isaiah Jackson MRN: [id number] DOB: 08/08/1933 Today's Date: 02/05/2021   History of Present Illness  The pt is an 87 yo male presenting 7/30 with abdominal pain. Upon workup, pt found to have bowel perforation and underwent ex lap and repair of perforated ulcer on 7/30. Extubated 7/31. Pt with new on set tachycardia and a fib with RVR on 8/1. PMH includes: HTN, COPD, HLD, CAD, CKD IV, DM II, and stage IV kidney cancer.   Clinical Impression  Pt in bed upon arrival of PT, agreeable to evaluation at this time. Prior to admission the pt was living alone, and reports independence with mobility and ADLs. The pt's mobility was limited at this time due to reports of abdominal pain, but he was able to demo ROM against gravity in all extremities. The pt was able to complete bed mobility and rolling with minG, verbal cues for technique, and intermittent minA to complete the movements. The pt declined OOB transfers and mobility at this time due to abdominal pain, but reports he understands the importance of progressing mobility OOB. The pt tolerated transition to [facility] at 45 deg in chair position with all VSS and no change in pain. Educated in exercises for all extremities, will continue to benefit from skilled PT acutely to progress OOB activity tolerance and further assess DME needed for safe return home.       Follow Up Recommendations Home health PT;Supervision/Assistance - 24 hour (if 24/7 not available, may need short stint rehab pending OOB mobility)    Equipment Recommendations  3in1 (PT)    Recommendations for Other Services       Precautions / Restrictions Precautions Precautions: Fall (abdominal) Precaution Comments: JP drain on abdomen Restrictions Weight Bearing Restrictions: No      Mobility  Bed Mobility Overal bed mobility: Needs Assistance Bed Mobility: Rolling Rolling: Min guard;Min assist         General bed mobility  comments: minG to initiate movement with VC for sequencing/technique. Pt needing minA to complete movement. pt declined all further bed mobility, but tolerated HOB elevation to 45 deg with no change in pain    Transfers                 General transfer comment: pt declined due to pain         Pertinent Vitals/Pain Pain Assessment: 0-10 Pain Score: 6  Pain Location: abdomen Pain Descriptors / Indicators: Discomfort;Grimacing Pain Intervention(s): Limited activity within patient's tolerance;Monitored during session;Repositioned    Home Living Family/patient expects to be discharged to:: Private residence Living Arrangements: Children Available Help at Discharge: Family Type of Home: House Home Access: Stairs to enter Entrance Stairs-Rails: Right Entrance Stairs-Number of Steps: 2 Home Layout: One level Home Equipment: Environmental consultant - 2 wheels Additional Comments: pt states he was previously living alone in a home with 4 STE with R rail, but with new plans to d/c to daughters house where he has 2 STE and first floor bedroom    Prior Function Level of Independence: Independent with assistive device(s)         Comments: pt reports some use of RW prior to admission, but that he was living alone     Hand Dominance   Dominant Hand: Right    Extremity/Trunk Assessment   Upper Extremity Assessment Upper Extremity Assessment: Generalized weakness    Lower Extremity Assessment Lower Extremity Assessment: Generalized weakness    Cervical / Trunk Assessment Cervical / Trunk Assessment:  Other exceptions Cervical / Trunk Exceptions: s/p abdominal surgery  Communication   Communication: Expressive difficulties (pt reports sore throat, can speak with significant effort but relying on nodding and gestures through session)  Cognition Arousal/Alertness: Awake/alert Behavior During Therapy: Flat affect Overall Cognitive Status: Impaired/Different from baseline Area of  Impairment: Safety/judgement;Problem solving                         Safety/Judgement: Decreased awareness of safety   Problem Solving: Slow processing;Decreased initiation;Requires verbal cues General Comments: pt with decreased verbalizations but able to follow commands for sequencing of movements. pt answering yes/no questions consistently. reports pain in stomach limiting movement progression, but also declining pain medicines at this time      General Comments General comments (skin integrity, edema, etc.): VSS on 2L O2    Exercises General Exercises - Lower Extremity Ankle Circles/Pumps: AROM;Both;10 reps Short Arc Quad: AROM;Both;10 reps;Supine Heel Slides: AROM;Both;10 reps;Supine   Assessment/Plan    PT Assessment Patient needs continued PT services  PT Problem List Decreased strength;Decreased range of motion;Decreased activity tolerance;Decreased balance;Decreased mobility;Pain       PT Treatment Interventions DME instruction;Gait training;Stair training;Functional mobility training;Therapeutic activities;Therapeutic exercise;Balance training;Patient/family education    PT Goals (Current goals can be found in the Care Plan section)  Acute Rehab PT Goals Patient Stated Goal: to reduce pain PT Goal Formulation: With patient Time For Goal Achievement: 02/19/21 Potential to Achieve Goals: Good    Frequency Min 3X/week   Barriers to discharge        Co-evaluation               AM-PAC PT "6 Clicks" Mobility  Outcome Measure Help needed turning from your back to your side while in a flat bed without using bedrails?: A Little Help needed moving from lying on your back to sitting on the side of a flat bed without using bedrails?: A Lot Help needed moving to and from a bed to a chair (including a wheelchair)?: A Lot Help needed standing up from a chair using your arms (e.g., wheelchair or bedside chair)?: A Little Help needed to walk in hospital room?:  A Lot Help needed climbing 3-5 steps with a railing? : A Lot 6 Click Score: 14    End of Session Equipment Utilized During Treatment: Oxygen Activity Tolerance: Patient limited by pain Patient left: in bed;with bed alarm set;with call bell/phone within reach Nurse Communication: Mobility status PT Visit Diagnosis: Other abnormalities of gait and mobility (R26.89);Unsteadiness on feet (R26.81);Muscle weakness (generalized) (M62.81)    Time: 5102-5852 PT Time Calculation (min) (ACUTE ONLY): 23 min   Charges:   PT Evaluation $PT Eval Low Complexity: 1 Low PT Treatments $Therapeutic Activity: 8-22 mins        Inocencio Homes, PT, DPT   Acute Rehabilitation Department Pager #: [phone]  Otho Bellows 02/05/2021, 3:25 PM

## 2021-02-05 DEATH — deceased

## 2021-02-06 ENCOUNTER — Inpatient Hospital Stay (HOSPITAL_COMMUNITY): Payer: Medicare Other

## 2021-02-06 DIAGNOSIS — Z66 Do not resuscitate: Secondary | ICD-10-CM | POA: Diagnosis not present

## 2021-02-06 DIAGNOSIS — Z515 Encounter for palliative care: Secondary | ICD-10-CM

## 2021-02-06 DIAGNOSIS — K631 Perforation of intestine (nontraumatic): Secondary | ICD-10-CM | POA: Diagnosis not present

## 2021-02-06 DIAGNOSIS — Z7189 Other specified counseling: Secondary | ICD-10-CM | POA: Diagnosis not present

## 2021-02-06 LAB — MAGNESIUM: Magnesium: 2.2 mg/dL (ref 1.7–2.4)

## 2021-02-06 LAB — COMPREHENSIVE METABOLIC PANEL WITH GFR
ALT: 10 U/L (ref 0–44)
AST: 16 U/L (ref 15–41)
Albumin: 2.6 g/dL — ABNORMAL LOW (ref 3.5–5.0)
Alkaline Phosphatase: 53 U/L (ref 38–126)
Anion gap: 14 (ref 5–15)
BUN: 49 mg/dL — ABNORMAL HIGH (ref 8–23)
CO2: 18 mmol/L — ABNORMAL LOW (ref 22–32)
Calcium: 10 mg/dL (ref 8.9–10.3)
Chloride: 103 mmol/L (ref 98–111)
Creatinine, Ser: 2.53 mg/dL — ABNORMAL HIGH (ref 0.61–1.24)
GFR, Estimated: 24 mL/min — ABNORMAL LOW
Glucose, Bld: 102 mg/dL — ABNORMAL HIGH (ref 70–99)
Potassium: 4.7 mmol/L (ref 3.5–5.1)
Sodium: 135 mmol/L (ref 135–145)
Total Bilirubin: 1.4 mg/dL — ABNORMAL HIGH (ref 0.3–1.2)
Total Protein: 6.1 g/dL — ABNORMAL LOW (ref 6.5–8.1)

## 2021-02-06 LAB — CBC WITH DIFFERENTIAL/PLATELET
Abs Immature Granulocytes: 0.06 10*3/uL (ref 0.00–0.07)
Basophils Absolute: 0 10*3/uL (ref 0.0–0.1)
Basophils Relative: 0 %
Eosinophils Absolute: 0 10*3/uL (ref 0.0–0.5)
Eosinophils Relative: 0 %
HCT: 58.6 % — ABNORMAL HIGH (ref 39.0–52.0)
Hemoglobin: 18 g/dL — ABNORMAL HIGH (ref 13.0–17.0)
Immature Granulocytes: 1 %
Lymphocytes Relative: 7 %
Lymphs Abs: 0.8 10*3/uL (ref 0.7–4.0)
MCH: 24.4 pg — ABNORMAL LOW (ref 26.0–34.0)
MCHC: 30.7 g/dL (ref 30.0–36.0)
MCV: 79.5 fL — ABNORMAL LOW (ref 80.0–100.0)
Monocytes Absolute: 0.9 10*3/uL (ref 0.1–1.0)
Monocytes Relative: 7 %
Neutro Abs: 10.7 10*3/uL — ABNORMAL HIGH (ref 1.7–7.7)
Neutrophils Relative %: 85 %
Platelets: 312 10*3/uL (ref 150–400)
RBC: 7.37 MIL/uL — ABNORMAL HIGH (ref 4.22–5.81)
RDW: 21.1 % — ABNORMAL HIGH (ref 11.5–15.5)
WBC: 12.5 10*3/uL — ABNORMAL HIGH (ref 4.0–10.5)
nRBC: 0 % (ref 0.0–0.2)

## 2021-02-06 LAB — GLUCOSE, CAPILLARY
Glucose-Capillary: 109 mg/dL — ABNORMAL HIGH (ref 70–99)
Glucose-Capillary: 119 mg/dL — ABNORMAL HIGH (ref 70–99)
Glucose-Capillary: 139 mg/dL — ABNORMAL HIGH (ref 70–99)
Glucose-Capillary: 147 mg/dL — ABNORMAL HIGH (ref 70–99)

## 2021-02-06 LAB — PROCALCITONIN: Procalcitonin: 9.47 ng/mL

## 2021-02-06 NOTE — Progress Notes (Signed)
[facility] ([facility])  Hospital Liaison: RN note         This patient has been referred to our palliative care services in the community.  [facility] will continue to follow for any discharge planning needs and to coordinate continuation of palliative care in the outpatient setting.    If you have questions or need assistance, please call [phone] or contact the hospital Liaison listed on AMION.      Thank you for this referral.         Mary Anne Robertson, RN, CCM  [facility] Hospital Liaison   [phone] 

## 2021-02-06 NOTE — Care Management Important Message (Signed)
Important Message  Patient Details  Name: Isaiah Jackson MRN: [id number] Date of Birth: [birth year]   Medicare Important Message Given:  Yes     Orbie Pyo 02/06/2021, 2:46 PM

## 2021-02-06 NOTE — Progress Notes (Signed)
[facility] Progress Note  Patient: Isaiah Jackson    IYM:[id number]  DOA: 01/12/2021     Date of Service: the patient was seen and examined on 02/06/2021  Brief hospital course: Past medical history of COPD, HTN, CKD 4, renal cancer, HLD, CAD, CVA on Plavix, DNR on admission.  Presents with complaints of abdominal pain and nausea and vomiting.  Found to have bowel perforation. SP OR with Dr. Grandville Silos for exploratory laparotomy and patch repair. Currently plan is current care.  Subjective: Voice still muffled.  Intermittently confused per family and having delusions.  No nausea no vomiting.  No fever no chills.  No acute complaint.  No pain.  NG tube removed by surgery.  Assessment and Plan: 1.  Acute respiratory failure with hypoxia requiring intubation Postprocedure. History of COPD. Extubated right now. Limited code Requesting reintubated if required. Chest x-ray shows mild congestion.  With patient's poor p.o. intake despite elevated BNP we will be monitoring and not diuresing.  2.  Recent exploratory laparotomy with patch repair. Perforated peptic ulcer. Management per general surgery. Continue PPI twice daily. Continue with IV Zosyn. Gentle IV fluids NG tube removed.  3.  Chronic kidney disease stage IV. History of renal cancer. Patient has declined hemodialysis in the past.  Also has declined therapy for cancer. Will monitor for now.  Renal function has worsened during the hospital course and worsen for last 24 hours.  Not accurate ins and outs.  Monitor.  4.  History of CVA on Plavix Currently holding antiplatelet medication.  Resume per surgery.  5.  A. fib with RVR. Essential hypertension. HLD. Currently on amiodarone drip. Will continue. Not a good candidate for anticoagulation given recent history and admission for peptic ulcer perforation. Add Lopressor with holding parameters.  6.  Type 2 diabetes mellitus uncontrolled with hyperglycemia with renal  complication without any long-term insulin use On every 6 hours insulin sliding scale insulin.  7.  Goals of care conversation. Palliative care team consulted for further discussion regarding goals of care.  Currently limited code.  8.  Iron deficiency. Polycythemia Patient does not have anemia and as a matter fact appears to have polycythemia. Iron level still significantly low. Will perform further work-up and discussed with hematology.  [facility] induced delirium. Family reports confusion.  Currently appears to be stable.  Will monitor.   Scheduled Meds:  chlorhexidine gluconate (MEDLINE KIT)  15 mL Mouth Rinse BID   Chlorhexidine Gluconate Cloth  6 each Topical Daily   heparin  5,000 Units Subcutaneous Q12H   insulin aspart  0-9 Units Subcutaneous Q6H   metoprolol tartrate  5 mg Intravenous Q6H   pantoprazole (PROTONIX) IV  40 mg Intravenous Q12H   Continuous Infusions:  amiodarone 30 mg/hr (02/06/21 1336)   piperacillin-tazobactam (ZOSYN)  IV 3.375 g (02/06/21 1140)   PRN Meds: acetaminophen **OR** acetaminophen, hydrALAZINE, HYDROcodone-acetaminophen, ondansetron (ZOFRAN) IV  Body mass index is 25.5 kg/m.        DVT Prophylaxis:   heparin injection 5,000 Units Start: 02/04/21 1000 SCDs Start: 01/12/2021 1553    Advance goals of care discussion: Pt is DNR.  Family Communication: no family was present at bedside, at the time of interview.   Data Reviewed: I have personally reviewed and interpreted daily labs, tele strips, imaging. Electrolytes stable.  BUN and creatinine elevated.  Metabolic acidosis still present.  Hemoglobin 18.  Physical Exam:  General: Appear in mild distress, no Rash; Oral Mucosa Clear, moist. no Abnormal Neck Mass  Or lumps, Conjunctiva normal  Cardiovascular: S1 and S2 Present, no Murmur, Respiratory: good respiratory effort, Bilateral Air entry present and bilateral Crackles, no wheezes Abdomen: Bowel Sound present, Soft and no  tenderness Extremities: no Pedal edema Neurology: alert and oriented to time, place, and person affect appropriate. no new focal deficit Gait not checked due to patient safety concerns   Vitals:   02/06/21 0300 02/06/21 0700 02/06/21 1100 02/06/21 1700  BP: 136/64 140/80 115/75 116/81  Pulse:      Resp:      Temp: 97.9 F (36.6 C) 98.7 F (37.1 C) 98.1 F (36.7 C) 97.7 F (36.5 C)  TempSrc: Oral Oral Oral Oral  SpO2:   95%   Weight:      Height:        Disposition:  Status is: Inpatient  Remains inpatient appropriate because:IV treatments appropriate due to intensity of illness or inability to take PO and Inpatient level of care appropriate due to severity of illness  Dispo: The patient is from: Home              Anticipated d/c is to: SNF              Patient currently is not medically stable to d/c.   Difficult to place patient No  Time spent: 35 minutes. I reviewed all nursing notes, pharmacy notes, vitals, pertinent old records. I have discussed plan of care as described above with RN.  Author: Berle Mull, MD [facility] 02/06/2021 6:27 PM  To reach On-call, see care teams to locate the attending and reach out via [email or web address]. Between 7PM-7AM, please contact night-coverage If you still have difficulty reaching the attending provider, please page the [facility] (Director on Call) for [facility] on amion for assistance.

## 2021-02-06 NOTE — Progress Notes (Signed)
Patient ID: EULICE RUTLEDGE, male   DOB: 01/21/1934, 87 y.o.   MRN: [id number] 3 Days Post-Op   Subjective: Reports passing gas, no nausea ROS negative except as listed above. Objective: Vital signs in last 24 hours: Temp:  [97.7 F (36.5 C)-99.4 F (37.4 C)] 98.7 F (37.1 C) (08/02 0700) Pulse Rate:  [85-123] 94 (08/02 0100) Resp:  [17-30] 18 (08/02 0100) BP: (101-170)/(61-97) 140/80 (08/02 0700) SpO2:  [90 %-97 %] 90 % (08/02 0100) Last BM Date: 01/29/21  Intake/Output from previous day: 08/01 0701 - 08/02 0700 In: 325 [I.V.:251.1; IV Piggyback:73.9] Out: 480 [Urine:375; Emesis/NG output:50; Drains:55] Intake/Output this shift: Total I/O In: -  Out: 26 [Drains:50]  General appearance: cooperative Resp: clear to auscultation bilaterally Cardio: irregularly irregular rhythm GI: soft, wet to dry on wound, JP SS  Lab Results: CBC  Recent Labs    02/05/21 0825 02/06/21 0242  WBC 12.7* 12.5*  HGB 17.0 18.0*  HCT 55.0* 58.6*  PLT 292 312   BMET Recent Labs    02/05/21 0033 02/06/21 0242  NA 133* 135  K 4.5 4.7  CL 103 103  CO2 18* 18*  GLUCOSE 91 102*  BUN 44* 49*  CREATININE 2.39* 2.53*  CALCIUM 9.4 10.0   PT/INR No results for input(s): LABPROT, INR in the last 72 hours. ABG Recent Labs    01/13/2021 2120  PHART 7.217*  HCO3 20.2    Studies/Results: No results found.  Anti-infectives: Anti-infectives (From admission, onward)    Start     Dose/Rate Route Frequency Ordered Stop   02/04/21 1100  piperacillin-tazobactam (ZOSYN) IVPB 3.375 g        3.375 g 12.5 mL/hr over 240 Minutes Intravenous Every 8 hours 02/04/21 1013     01/22/2021 1345  piperacillin-tazobactam (ZOSYN) IVPB 2.25 g  Status:  Discontinued        2.25 g 100 mL/hr over 30 Minutes Intravenous Every 8 hours 01/20/2021 1341 02/04/21 1013       Assessment/Plan: POD 3, S/P repair and patch perforated pyloric ulcer by Dr. Grandville Silos 7/30  -D/C NGT, try sips -protonix BID -cont zosyn  for 5 days post op -NS WD dressing changes BID -cont JP drain for now  FEN - NGT/IVFs, NPO VTE - heparin ID - zosyn  Acute hypoxic ventilator dependent respiratory failure -extubated AI - per primary COPD CAD DM HLD HTN RCC  LOS: 3 days    Georganna Skeans, MD, MPH, FACS Trauma & General Surgery Use [email or web address] to contact on call provider  02/06/2021

## 2021-02-06 NOTE — Anesthesia Postprocedure Evaluation (Signed)
Anesthesia Post Note  Patient: Isaiah Jackson  Procedure(s) Performed: EXPLORATORY LAPAROTOMY REPAIR OF PERFORATED ULCER (Abdomen)     Patient location during evaluation: PACU Anesthesia Type: General Level of consciousness: lethargic Pain management: pain level controlled Vital Signs Assessment: post-procedure vital signs reviewed and stable Respiratory status: spontaneous breathing, patient connected to nasal cannula oxygen, respiratory function unstable and patient re-intubated Cardiovascular status: blood pressure returned to baseline and stable Postop Assessment: no apparent nausea or vomiting Anesthetic complications: no Comments: Patient reintubated in [place]

## 2021-02-06 NOTE — Progress Notes (Signed)
Physical Therapy Treatment Patient Details Name: Isaiah Jackson MRN: [id number] DOB: [birth year] Today's Date: 02/06/2021    History of Present Illness The pt is an 87 yo male presenting 7/30 with abdominal pain. Upon workup, pt found to have bowel perforation and underwent ex lap and repair of perforated ulcer on 7/30. Extubated 7/31. Pt with new on set tachycardia and a fib with RVR on 8/1. PMH includes: HTN, COPD, HLD, CAD, CKD IV, DM II, and stage IV kidney cancer.    PT Comments    Pt with decreased pain compared to yesterday and agreeable to OOB. Pt able to tolerate transfer to chair with maxAx2. Pt with noted weakness however first time up OOB. Will continue to work with patient and re-evaluate d/c recommendations. Pt did became delayed in response time once up in chair however BP was on higher side and once pt's legs were elevated pt became engaged and desired to stay up in chair. Acute PT to cont to follow.    Follow Up Recommendations  Home health PT;Supervision/Assistance - 24 hour (if 24/7 not available, may need short stint rehab pending OOB mobility)     Equipment Recommendations  3in1 (PT)    Recommendations for Other Services       Precautions / Restrictions Precautions Precautions: Fall (abdominal) Precaution Comments: JP drain on abdomen Restrictions Weight Bearing Restrictions: No    Mobility  Bed Mobility Overal bed mobility: Needs Assistance Bed Mobility: Rolling;Sidelying to Sit Rolling: Min assist Sidelying to sit: Mod assist       General bed mobility comments: minA to complete rolling to R side, pt used UEs to pull on rail with verbal cues to complete log roll, modA for trunk elevation to EOB    Transfers Overall transfer level: Needs assistance Equipment used: 2 person hand held assist (face to face with bed pad) Transfers: Sit to/from [employer] Transfers Sit to Stand: Max assist;+2 physical assistance Stand pivot transfers: Max  assist;+2 physical assistance       General transfer comment: maxAx2 to power up, pt initiated stepping towards chair but required modA to advance LEs to chair  Ambulation/Gait             General Gait Details: unable this date   Stairs             Wheelchair Mobility    Modified Rankin (Stroke Patients Only)       Balance Overall balance assessment: Needs assistance Sitting-balance support: Feet supported;Bilateral upper extremity supported Sitting balance-Leahy Scale: Poor     Standing balance support: During functional activity;Bilateral upper extremity supported Standing balance-Leahy Scale: Poor Standing balance comment: dependent on external support                            Cognition Arousal/Alertness: Awake/alert Behavior During Therapy: Flat affect Overall Cognitive Status: Impaired/Different from baseline Area of Impairment: Safety/judgement;Problem solving                         Safety/Judgement: Decreased awareness of safety   Problem Solving: Slow processing;Decreased initiation;Requires verbal cues General Comments: pt with delayed processing but was able to follow simple commands and gave good effort, pt trying to mouth and gave gestures to communicate desire for water however pt NPO      Exercises General Exercises - Lower Extremity Ankle Circles/Pumps: AROM;Both;10 reps Long Arc Quad: AROM;Both;10 reps;Seated    General Comments General  comments (skin integrity, edema, etc.): pt on 3LO2 via [place], dropped to 82%, inc to 4Lo2 via RN request, PB increased upto 140s/90s, RN present to assess      Pertinent Vitals/Pain Pain Assessment: Faces Faces Pain Scale: Hurts little more Pain Location: abdomen with mobility Pain Descriptors / Indicators: Discomfort;Grimacing Pain Intervention(s): Monitored during session    Home Living                      Prior Function            PT Goals (current goals can  now be found in the care plan section) Acute Rehab PT Goals PT Goal Formulation: With patient Time For Goal Achievement: 02/19/21 Potential to Achieve Goals: Good Progress towards PT goals: Progressing toward goals    Frequency    Min 3X/week      PT Plan Current plan remains appropriate    Co-evaluation              AM-PAC PT "6 Clicks" Mobility   Outcome Measure  Help needed turning from your back to your side while in a flat bed without using bedrails?: A Little Help needed moving from lying on your back to sitting on the side of a flat bed without using bedrails?: A Lot Help needed moving to and from a bed to a chair (including a wheelchair)?: A Lot Help needed standing up from a chair using your arms (e.g., wheelchair or bedside chair)?: A Little Help needed to walk in hospital room?: A Lot Help needed climbing 3-5 steps with a railing? : A Lot 6 Click Score: 14    End of Session Equipment Utilized During Treatment: Oxygen Activity Tolerance: Patient limited by pain Patient left: in bed;with bed alarm set;with call bell/phone within reach Nurse Communication: Mobility status PT Visit Diagnosis: Other abnormalities of gait and mobility (R26.89);Unsteadiness on feet (R26.81);Muscle weakness (generalized) (M62.81)     Time: 0086-7619 PT Time Calculation (min) (ACUTE ONLY): 22 min  Charges:  $Therapeutic Activity: 8-22 mins                     Kittie Plater, PT, DPT Acute Rehabilitation Services Pager #: [phone] Office #: [phone]    Berline Lopes 02/06/2021, 3:00 PM

## 2021-02-06 NOTE — TOC Initial Note (Signed)
Transition of Care [facility]) - Initial/Assessment Note    Patient Details  Name: Isaiah Jackson MRN: [id number] Date of Birth: 1934-03-03  Transition [facility]) CM/SW Contact:    Bethena Roys, RN Phone Number: 02/06/2021, 3:26 PM  Clinical Narrative: Case Manager received a referral for outpatient palliative services. Case Manager spoke with daughter Kenney Houseman and the plan is to transition home with [facility] for outpatient palliative. Per daughter, the patient will transition to his daughter [place] home @ [place] [address]. Patient and family are agreeable to home health via [facility]-referral made for [facility] PT and RN for dressing changes. If home continues to be the plan, the patient will need [facility] PT/RN orders with F2F and DME orders bedside commode which has been ordered via [employer]. Physician wanted to hold off placing orders to see how the patient progresses. The Transition of Care team will continue to follow this patient.                Expected Discharge Plan: [place] Barriers to Discharge: Continued Medical Work up   Patient Goals and CMS Choice Patient states their goals for this hospitalization and ongoing recovery are:: to return home   Choice offered to / list presented to : NA (Daughter had no preference.)  Expected Discharge Plan and Services Expected Discharge Plan: [place] In-house Referral: Hospice / Palliative Care Discharge Planning Services: CM Consult Post Acute Care Choice: Home Health, Durable Medical Equipment Living arrangements for the past 2 months: Single Family Home                 DME Arranged: Bedside commode (Please follow for orders.) DME Agency: [employer] Date DME Agency Contacted: 02/06/21 Time DME Agency Contacted: 1500 Representative spoke with at DME Agency: Freda Munro HH Arranged: RN, Disease Management, PT [place] Agency: [place] Date [place]:  02/06/21 Time [place]: 1504 Representative spoke with at [place]: [place] Arrangements/Services Living arrangements for the past 2 months: [place] with:: Self (plan to go to his daughters home in [place]: [place] [address].) Patient language and need for interpreter reviewed:: Yes Do you feel safe going back to the place where you live?: Yes      Need for Family Participation in Patient Care: Yes (Comment) Care giver support system in place?: Yes (comment)   Criminal Activity/Legal Involvement Pertinent to Current Situation/Hospitalization: No - Comment as needed  Activities of Daily Living Home Assistive Devices/Equipment: None ADL Screening (condition at time of admission) Patient's cognitive ability adequate to safely complete daily activities?: Yes Is the patient deaf or have difficulty hearing?: No Does the patient have difficulty seeing, even when wearing glasses/contacts?: No Does the patient have difficulty concentrating, remembering, or making decisions?: No Patient able to express need for assistance with ADLs?: No Does the patient have difficulty dressing or bathing?: No Independently performs ADLs?: Yes (appropriate for developmental age) Does the patient have difficulty walking or climbing stairs?: No Weakness of Legs: Both Weakness of Arms/Hands: Both  Permission Sought/Granted Permission sought to share information with : Facility Sport and exercise psychologist, Tourist information centre manager, Family Supports Permission granted to share information with : Yes, Verbal Permission Granted     Permission granted to share info w AGENCY: [employer], Alvis Lemmings        Emotional Assessment Appearance:: Appears stated age Attitude/Demeanor/Rapport: Engaged Affect (typically observed): Appropriate Orientation: : Oriented  to  Time, Oriented to Situation, Oriented to Self, Oriented to Place Alcohol / Substance Use: Not Applicable Psych  Involvement: No (comment)  Admission diagnosis:  Bowel perforation (HCC) [K63.1] Acute renal failure superimposed on chronic kidney disease, unspecified CKD stage, unspecified acute renal failure type ([place]) [N17.9, N18.9] Status post surgery [Z98.890] Patient Active Problem List   Diagnosis Date Noted   Bowel perforation ([place]) 01/11/2021   Status post surgery 01/09/2021   Cancer of left kidney ([place]) 01/29/2021   Actinic keratoses 09/01/2019   Former smoker (42 pack/year history, quit 1991) 08/31/2019   CKD stage 4 due to type 2 diabetes mellitus ([place]) 10/13/2017   Atherosclerosis of aorta ([place]) 10/11/2016   PAD (peripheral artery disease) ([place]) 10/11/2016   Overweight (BMI 25.0-29.9) 05/25/2015   ASCAD s/p PTCA/Stents 11/07/2014   Medication management 10/13/2013   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 ([place]) 10/13/2013   Hyperlipidemia associated with type 2 diabetes mellitus ([place])    Hypertension    Vitamin D deficiency    COPD (chronic obstructive pulmonary disease) ([place])    Aneurysm of abdominal vessel ([place]) 11/07/2011   PCP:  [name], MD Pharmacy:   [facility] [place], [place] [address] [place] [address] Phone: [phone] Fax: [phone]  [place] ([place]), [place] - [address] [place] [address] Phone: [phone] Fax: [phone]    Readmission Risk Interventions No flowsheet data found.

## 2021-02-06 NOTE — Consult Note (Signed)
Palliative Medicine Inpatient Consult Note  Consulting Provider: Lavina Hamman, MD  Reason for consult:    Answer  Palliative Care Consult Services Palliative Medicine Consult  Reason for Consult? goals of care conversations    HPI:  Per intake H&P --> Past medical history of COPD, HTN, CKD 4, renal cancer, HLD, CAD, CVA on Plavix, DNR on admission.  Presents with complaints of abdominal pain and nausea and vomiting.  Found to have bowel perforation. SP OR with Dr. Grandville Jackson for exploratory laparotomy and patch repair. NPO presently. Palliative care has been asked to get involved to further discuss goals of care in the setting of multiple co-morbidities and acute illness.  Clinical Assessment/Goals of Care:  *Please note that this is a verbal dictation therefore any spelling or grammatical errors are due to the "[place] One" system interpretation.  I have reviewed medical records including EPIC notes, labs and imaging, received report from bedside RN, assessed the patient who was lying in bed in no acute distress.   I met with Isaiah Jackson to further discuss diagnosis prognosis, GOC, EOL wishes, disposition and options.  Isaiah Jackson and I reviewed his active medical problems inclusive of his bowel perforation requiring surgical intervention through an exploratory laparotomy.  We reviewed that he had a perforated pyloric ulcer which required patch placement.  Discussed the reason why he had a nasogastric tube in and the medications that he was on presently postoperatively inclusive of Zosyn and Protonix.  We reviewed Isaiah Jackson's past medical history inclusive of his history of renal cell carcinoma, COPD, CKD stage IV, coronary artery disease, and prior CVA.   I introduced Palliative Medicine as specialized medical care for people living with serious illness. It focuses on providing relief from the symptoms and stress of a serious illness. The goal is to improve quality of life for both the  patient and the family.  A brief historical review was completed with Isaiah Jackson.  He shares with me that he is from [place], [place].  He is a widower as his wife passed away 10 years ago.  He has 2 daughters who are actively involved in his day-to-day life, Isaiah Jackson and Isaiah Jackson.  He is a man who values his independence.  He raises homing pigeons.  He has a very small poodle who he had doors and also is quite attached to him.  He is a man of faith and practices within the [employer] denomination.  Prior to hospitalization Isaiah Jackson had been living independently.  He had been able to perform all basic activities of daily living on his own.  Interestingly he had only lived on his own for the past 2 weeks as prior to that he had been living with his daughter, Isaiah Jackson after having received his diagnosis of renal cell carcinoma in July of this year.  A detailed discussion was had today regarding advanced directives -Isaiah Jackson does have a healthcare power of attorney and living will which he consents to me requesting from his daughter.   Concepts specific to code status, artifical feeding and hydration, continued IV antibiotics and rehospitalization was had.  Isaiah Jackson is a DO NOT RESUSCITATE CODE STATUS as per discussion with his family previously and active conversations this morning.  We reviewed the trauma and stress endured by cardiopulmonary resuscitation and how this is unfortunately something that at this stage in his life would likely not benefit him.  He is in full agreement with this and does consent to DO NOT RESUSCITATE.  He shares that  he would only wish for intubation or ICU transfer if true improvements would be made secondary to these interventions.   Is of the present time Isaiah Jackson is hopeful to improve his strength and overall health after hospitalization.  He does plan to move in with his daughter Isaiah Jackson long-term.  ____________________________________________________________________ Addendum:  I was able  to call patient's daughter Isaiah Jackson this morning and have a very detailed conversation about the above.  We reviewed that Kjell is a man who is fiercely independent which Isaiah Jackson attests to the reason why he is in the situation he is in.  We discussed that he is now in a more frail state after undergoing surgery and he will need comprehensive follow-up after hospitalization.  Isaiah Jackson shares with me that she is fully in agreement with outpatient palliative care as her husband died of liver cancer only a year ago and they had an excellent experience with [facility] palliative care and hospice program.  He vocalizes understanding of Isaiah Jackson's complex comorbidities and that if he is not thriving at home hospice care will need to be initiated.  She for the time being is in agreements with her father's desire to improve his strength and mobility with outpatient home health.  We reviewed that if he starts to decline more than improve that is when it is time to involve hospice services.  Isaiah Jackson is very thankful for speaking with palliative care this morning all questions and concerns have been addressed appropriately.  We will continue to reach out to her daily during Isaiah Jackson's hospitalization.  Isaiah Jackson has agreed to bring in a copy of Isaiah Jackson's advanced directives.  Decision Maker: Isaiah Jackson (daughter) - [phone]  SUMMARY OF RECOMMENDATIONS   DNAR --> He shares that he would only wish for intubation or ICU transfer if true improvements would be made secondary to these interventions. This will be an ongoing conversation with he and his daughters though I was frank that he likely would endure greater harm due to these things than benefit if it comes down to it based upon his age and co-morbidities  Patients daughter will provide a copy of advance directives this afternoon  Goals are for improvements at this time --> Reviewed the importance of getting OOB   TOC --> Appreciate consults to home health and [facility]  outpatient Palliative services  Ongoing PMT support during hospitalization  Code Status/Advance Care Planning: DNAR    Palliative Prophylaxis:  Oral Care, Mobility  Additional Recommendations (Limitations, Scope, Preferences): Continue current scope of care   Psycho-social/Spiritual:  Desire for further Chaplaincy support: No Additional Recommendations: Education on chronic co-morbidities   Prognosis: Multiple co-morbid conditions.   Discharge Planning: Discharge to daughters home with home health  Vitals:   02/06/21 0100 02/06/21 0300  BP: 127/67 136/64  Jackson: 94   Resp: 18   Temp:  97.9 F (36.6 C)  SpO2: 90%     Intake/Output Summary (Last 24 hours) at 02/06/2021 0615 Last data filed at 02/05/2021 2000 Gross per 24 hour  Intake 324.98 ml  Output 480 ml  Net -155.02 ml   Last Weight  Most recent update: 01/26/2021  2:46 PM    Weight  85.3 kg (188 lb)            Gen:  Frail elderly M  HEENT:NGT in place, Dry mucous membranes CV: Irregular rate and rhythm  PULM: cOn 3LPM [place] ABD: soft/nontender  EXT: No edema  Neuro: Alert and oriented x3   PPS: 40%  This conversation/these recommendations were discussed with patient primary care team, Dr. Posey Pronto  Time In: 0700 Time Out: 0810 Total Time: 2 Greater than 50%  of this time was spent counseling and coordinating care related to the above assessment and plan.  [facility] Team Team Cell Phone: [phone] Please utilize secure chat with additional questions, if there is no response within 30 minutes please call the above phone number  Palliative Medicine Team providers are available by phone from 7am to 7pm daily and can be reached through the team cell phone.  Should this patient require assistance outside of these hours, please call the patient's attending physician.

## 2021-02-07 ENCOUNTER — Inpatient Hospital Stay (HOSPITAL_COMMUNITY): Payer: Medicare Other

## 2021-02-07 DIAGNOSIS — Z515 Encounter for palliative care: Secondary | ICD-10-CM | POA: Diagnosis not present

## 2021-02-07 DIAGNOSIS — I4891 Unspecified atrial fibrillation: Secondary | ICD-10-CM

## 2021-02-07 DIAGNOSIS — Z66 Do not resuscitate: Secondary | ICD-10-CM | POA: Diagnosis not present

## 2021-02-07 DIAGNOSIS — K631 Perforation of intestine (nontraumatic): Secondary | ICD-10-CM | POA: Diagnosis not present

## 2021-02-07 DIAGNOSIS — Z7189 Other specified counseling: Secondary | ICD-10-CM | POA: Diagnosis not present

## 2021-02-07 LAB — COMPREHENSIVE METABOLIC PANEL
ALT: 12 U/L (ref 0–44)
AST: 23 U/L (ref 15–41)
Albumin: 2.4 g/dL — ABNORMAL LOW (ref 3.5–5.0)
Alkaline Phosphatase: 64 U/L (ref 38–126)
Anion gap: 17 — ABNORMAL HIGH (ref 5–15)
BUN: 59 mg/dL — ABNORMAL HIGH (ref 8–23)
CO2: 18 mmol/L — ABNORMAL LOW (ref 22–32)
Calcium: 10.3 mg/dL (ref 8.9–10.3)
Chloride: 100 mmol/L (ref 98–111)
Creatinine, Ser: 2.6 mg/dL — ABNORMAL HIGH (ref 0.61–1.24)
GFR, Estimated: 23 mL/min — ABNORMAL LOW (ref >60)
Glucose, Bld: 159 mg/dL — ABNORMAL HIGH (ref 70–99)
Potassium: 5.6 mmol/L — ABNORMAL HIGH (ref 3.5–5.1)
Sodium: 135 mmol/L (ref 135–145)
Total Bilirubin: 1.6 mg/dL — ABNORMAL HIGH (ref 0.3–1.2)
Total Protein: 6.3 g/dL — ABNORMAL LOW (ref 6.5–8.1)

## 2021-02-07 LAB — CBC WITH DIFFERENTIAL/PLATELET
Abs Immature Granulocytes: 0 10*3/uL (ref 0.00–0.07)
Basophils Absolute: 0 10*3/uL (ref 0.0–0.1)
Basophils Relative: 0 %
Eosinophils Absolute: 0 10*3/uL (ref 0.0–0.5)
Eosinophils Relative: 0 %
HCT: 60.4 % — ABNORMAL HIGH (ref 39.0–52.0)
Hemoglobin: 18.5 g/dL — ABNORMAL HIGH (ref 13.0–17.0)
Lymphocytes Relative: 1 %
Lymphs Abs: 0.1 10*3/uL — ABNORMAL LOW (ref 0.7–4.0)
MCH: 24.5 pg — ABNORMAL LOW (ref 26.0–34.0)
MCHC: 30.6 g/dL (ref 30.0–36.0)
MCV: 80 fL (ref 80.0–100.0)
Monocytes Absolute: 0.3 10*3/uL (ref 0.1–1.0)
Monocytes Relative: 3 %
Neutro Abs: 10.9 10*3/uL — ABNORMAL HIGH (ref 1.7–7.7)
Neutrophils Relative %: 96 %
Platelets: 268 10*3/uL (ref 150–400)
RBC: 7.55 MIL/uL — ABNORMAL HIGH (ref 4.22–5.81)
RDW: 21.4 % — ABNORMAL HIGH (ref 11.5–15.5)
WBC: 11.4 10*3/uL — ABNORMAL HIGH (ref 4.0–10.5)
nRBC: 0.2 % (ref 0.0–0.2)

## 2021-02-07 LAB — ECHOCARDIOGRAM COMPLETE
Area-P 1/2: 1.42 cm2
Height: 72 in
S' Lateral: 4.2 cm
Weight: 3008 oz

## 2021-02-07 LAB — GLUCOSE, CAPILLARY
Glucose-Capillary: 182 mg/dL — ABNORMAL HIGH (ref 70–99)
Glucose-Capillary: 164 mg/dL — ABNORMAL HIGH (ref 70–99)
Glucose-Capillary: 165 mg/dL — ABNORMAL HIGH (ref 70–99)

## 2021-02-07 LAB — PROCALCITONIN: Procalcitonin: 5.6 ng/mL

## 2021-02-07 LAB — MAGNESIUM: Magnesium: 2.3 mg/dL (ref 1.7–2.4)

## 2021-02-07 MED ORDER — FUROSEMIDE 10 MG/ML IJ SOLN
20.0000 mg | Freq: Once | INTRAMUSCULAR | Status: AC
  Administered 2021-02-07: 20 mg via INTRAVENOUS
  Filled 2021-02-07: qty 2

## 2021-02-07 MED ORDER — FUROSEMIDE 10 MG/ML IJ SOLN
40.0000 mg | Freq: Once | INTRAMUSCULAR | Status: AC
  Administered 2021-02-07: 40 mg via INTRAVENOUS
  Filled 2021-02-07: qty 4

## 2021-02-07 MED ORDER — CHLORHEXIDINE GLUCONATE 0.12 % MT SOLN
OROMUCOSAL | Status: AC
  Filled 2021-02-07: qty 15

## 2021-02-07 MED ORDER — FUROSEMIDE 10 MG/ML IJ SOLN: 40.0000 mg | Freq: Two times a day (BID) | INTRAMUSCULAR | Status: DC

## 2021-02-07 MED ORDER — PERFLUTREN LIPID MICROSPHERE
1.0000 mL | INTRAVENOUS | Status: AC | PRN
  Administered 2021-02-07: 2 mL via INTRAVENOUS
  Filled 2021-02-07: qty 10

## 2021-02-07 MED ORDER — MORPHINE 100MG IN NS 100ML (1MG/ML) PREMIX INFUSION
1.0000 mg/h | INTRAVENOUS | Status: DC
  Administered 2021-02-07: 1 mg/h via INTRAVENOUS
  Administered 2021-02-09 – 2021-02-11 (×2): 2 mg/h via INTRAVENOUS
  Filled 2021-02-07 (×4): qty 100

## 2021-02-07 NOTE — Progress Notes (Signed)
   Palliative Medicine Inpatient Follow Up Note  Consulting Provider: Lavina Hamman, MD   Reason for consult:     Answer  Palliative Care Consult Services Palliative Medicine Consult  Reason for Consult? goals of care conversations    HPI:  Per intake H&P --> Past medical history of COPD, HTN, CKD 4, renal cancer, HLD, CAD, CVA on Plavix, DNR on admission.  Presents with complaints of abdominal pain and nausea and vomiting.  Found to have bowel perforation. SP OR with Dr. Grandville Silos for exploratory laparotomy and patch repair. NPO presently. Palliative care has been asked to get involved to further discuss goals of care in the setting of multiple co-morbidities and acute illness.  Today's Discussion (02/07/2021):  *Please note that this is a verbal dictation therefore any spelling or grammatical errors are due to the "[place] One" system interpretation.  Chart reviewed.   Upon assessment this morning, Isaiah Jackson is mor tachypneic. He is more vocal with me. I was able to sit down with him and have another detailed conversation about his code status. We discussed that re-intubation would be likely more uncomfortable and I would suspect the benefits if he needed a third intubation would be little. He was more clear minded and shared that he would not want to go through another intubation. I reviewed that this would mean he would be a full DNAR/DNI and if things deteriorated the only goals at that time would be to pursue comfort oriented care. He was in agreement towards this.   Questions and concerns addressed   Objective Assessment: Vital Signs Vitals:   02/07/21 0600 02/07/21 0727  BP:  (!) 159/58  Pulse: 60 65  Resp: 17 (!) 34  Temp:  98.4 F (36.9 C)  SpO2:  95%    Intake/Output Summary (Last 24 hours) at 02/07/2021 1134 Last data filed at 02/07/2021 1245 Gross per 24 hour  Intake 760.73 ml  Output 800 ml  Net -39.27 ml   Last Weight  Most recent update: 02/02/2021  2:46 PM     Weight  85.3 kg (188 lb)            Gen:  Frail elderly M  HEENT:NGT in place, Dry mucous membranes CV: Irregular rate and rhythm PULM: On 3LPM [place] ABD: soft/nontender EXT: No edema Neuro: Alert and oriented x3  SUMMARY OF RECOMMENDATIONS   DNAR/DNI --> reviewed that [name] does not wish to be re-intubated   Advance directives will be scanned into [employer]   Goals are for improvements at this time --> Reviewed the importance of getting OOB   TOC --> Appreciate consults to home health and [facility] outpatient Palliative services   Ongoing PMT support during hospitalization  Time Spent: 30 Greater than 50% of the time was spent in counseling and coordination of care __________________________________________________________________________________ [facility] Team Team Cell Phone: [phone] Please utilize secure chat with additional questions, if there is no response within 30 minutes please call the above phone number  Palliative Medicine Team providers are available by phone from 7am to 7pm daily and can be reached through the team cell phone.  Should this patient require assistance outside of these hours, please call the patient's attending physician.

## 2021-02-07 NOTE — Progress Notes (Signed)
  Echocardiogram 2D Echocardiogram has been performed.  Fidel Levy 02/07/2021, 2:41 PM

## 2021-02-07 NOTE — Progress Notes (Signed)
Pharmacy Antibiotic Note  MELVYN HOMMES is a 87 y.o. male admitted on 02/04/2021 with bowel perforation s/p ex-lap with repair of perforated ulcer.  Pharmacy helping dose Zosyn.  Renal function improving, afebrile, WBC down to 11.4. SCr up to 2.6.   Plan: Change Zosyn to EID 3.375gm IV Q8H. Planning to continue Zosyn for 5 days post op  Monitor renal fxn, clinical progress  Height: 6' (182.9 cm) Weight: 85.3 kg (188 lb) IBW/kg (Calculated) : 77.6  Temp (24hrs), Avg:97.8 F (36.6 C), Min:97.6 F (36.4 C), Max:98.4 F (36.9 C)  Recent Labs  Lab 01/20/2021 1639 02/04/21 0153 02/04/21 0410 02/05/21 0033 02/05/21 0825 02/05/21 1110 02/06/21 0242 02/07/21 0228  WBC 14.1* 15.6*  --   --  12.7*  --  12.5* 11.4*  CREATININE 2.75*  --  2.41* 2.39*  --   --  2.53* 2.60*  LATICACIDVEN  --   --   --   --  1.0 1.1  --   --      Estimated Creatinine Clearance: 22.8 mL/min (A) (by C-G formula based on SCr of 2.6 mg/dL (H)).    Allergies  Allergen Reactions   Bee Venom    Capoten [Captopril]     Fatigue/depression   Lamisil [Terbinafine] Rash   Zosyn 7/30 >>   7/31 MRS PCR - negative  Albertina Parr, PharmD., BCPS, BCCCP Clinical Pharmacist Please refer to [facility] for unit-specific pharmacist

## 2021-02-07 NOTE — Progress Notes (Signed)
PT Cancellation Note  Patient Details Name: Isaiah Jackson MRN: [id number] DOB: 1934-02-01   Cancelled Treatment:    Reason Eval/Treat Not Completed: Patient not medically ready. RN reported pt has had a change in medical status with increased respiratory difficulties. RN requesting hold on PT at this time.   Moishe Spice, PT, DPT Acute Rehabilitation Services  Pager: [phone] Office: [place] 02/07/2021, 10:22 AM

## 2021-02-07 NOTE — Progress Notes (Signed)
Patient ID: Isaiah Jackson, male   DOB: 1934-02-20, 87 y.o.   MRN: [id number] 4 Days Post-Op   Subjective: Tolerated sips ROS negative except as listed above. Objective: Vital signs in last 24 hours: Temp:  [97.6 F (36.4 C)-98.4 F (36.9 C)] 98.4 F (36.9 C) (08/03 0727) Pulse Rate:  [60-69] 65 (08/03 0727) Resp:  [17-34] 34 (08/03 0727) BP: (113-180)/(58-84) 159/58 (08/03 0727) SpO2:  [95 %-98 %] 95 % (08/03 0727) Last BM Date: 01/29/21  Intake/Output from previous day: 08/02 0701 - 08/03 0700 In: 740.7 [I.V.:547.6; IV Piggyback:193.1] Out: 1250 [Urine:450; Drains:800] Intake/Output this shift: No intake/output data recorded.  General appearance: alert and cooperative Resp: clear to auscultation bilaterally and shallow resp Cardio: regular rate and rhythm GI: soft, wound OK, JP SS  Lab Results: CBC  Recent Labs    02/06/21 0242 02/07/21 0228  WBC 12.5* 11.4*  HGB 18.0* 18.5*  HCT 58.6* 60.4*  PLT 312 268   BMET Recent Labs    02/06/21 0242 02/07/21 0228  NA 135 135  K 4.7 5.6*  CL 103 100  CO2 18* 18*  GLUCOSE 102* 159*  BUN 49* 59*  CREATININE 2.53* 2.60*  CALCIUM 10.0 10.3   PT/INR No results for input(s): LABPROT, INR in the last 72 hours. ABG No results for input(s): PHART, HCO3 in the last 72 hours.  Invalid input(s): PCO2, PO2  Studies/Results: DG CHEST PORT 1 VIEW  Result Date: 02/06/2021 CLINICAL DATA:  Shortness of breath and weakness EXAM: PORTABLE CHEST 1 VIEW COMPARISON:  01/11/2021 FINDINGS: Midline trachea. Moderate cardiomegaly. Extubation and removal of nasogastric tube. Small bilateral pleural effusions. No pneumothorax. Biapical pleural thickening. Mild interstitial edema is increased. Right greater than left base airspace disease. IMPRESSION: Mild congestive heart failure, increased. Small bilateral pleural effusions with bibasilar Airspace disease, likely atelectasis. Electronically Signed   By: Abigail Miyamoto M.D.   On: 02/06/2021  15:02    Anti-infectives: Anti-infectives (From admission, onward)    Start     Dose/Rate Route Frequency Ordered Stop   02/04/21 1100  piperacillin-tazobactam (ZOSYN) IVPB 3.375 g        3.375 g 12.5 mL/hr over 240 Minutes Intravenous Every 8 hours 02/04/21 1013     01/10/2021 1345  piperacillin-tazobactam (ZOSYN) IVPB 2.25 g  Status:  Discontinued        2.25 g 100 mL/hr over 30 Minutes Intravenous Every 8 hours 01/19/2021 1341 02/04/21 1013       Assessment/Plan: POD 4, S/P repair and patch perforated pyloric ulcer by Dr. Grandville Silos 7/30  -clear liquids -protonix BID -cont zosyn for 5 days post op -NS WD dressing changes BID -cont JP drain for now  FEN - NGT/IVFs, NPO VTE - heparin ID - zosyn  Acute hypoxic ventilator dependent respiratory failure -extubated AI - per primary COPD CAD DM HLD HTN RCC  Noted partial code status  LOS: 4 days    Georganna Skeans, MD, MPH, FACS Trauma & General Surgery Use [email or web address] to contact on call provider  02/07/2021

## 2021-02-07 NOTE — Progress Notes (Signed)
[facility] Progress Note  Patient: Isaiah Jackson    GEX:[id number]  DOA: 01/28/2021     Date of Service: the patient was seen and examined on 02/07/2021  Brief hospital course: 54 white male community dwelling  Prior MI with angioplasty and stent LAD 1996 DM TY 2 since 2005 Abdominal aortic aneurysm status post stent repair 2007 Dr. Oneida Alar BPH HLD COPD, HTN, CKD 4, HLD, CVA on Plavix, DNR on admission.   Recent diagnosis large left kidney mass-conferencing multiple specialist family-patient declined work-up of mass, consideration dialysis   Presents with complaints of abdominal pain and nausea and vomiting.  Found to have bowel perforation. SP OR with Dr. Grandville Silos for exploratory laparotomy and patch repair. He developed worsening hypoxia and increasing work of breathing on 8/3 AM as below  Subjective:  Asked to assess earlier this morning by nursing as more tachypneic slightly more confused he is able to verbalize 1 or 2 sentences but does at times seem to "zone out" I was called also by cardiologist Dr. Marlou Porch about his echo showing an almost 3 cm intracardiac clot  Assessment and Plan: 1.  Acute respiratory failure with hypoxia requiring intubation postprocedure Patient has developed worsening hypoxia, CXR = effusion-Rx Lasix X1 40 and then 20 mg later today Detailed long discussion with family at the bedside several times today-I think he may be able to compensate and get out of respiratory failure but I do not think it would change overall likely terminal trajectory I have mentioned this to both his daughter and his niece on my last visit and they have confirmed complete DNR without limited CODE STATUS  2.  Recent exploratory laparotomy with patch repair. Perforated peptic ulcer. Management per general surgery. Continue PPI twice daily. Continue with IV Zosyn.  3.  Chronic kidney disease stage IV. History of renal cancer. Patient has declined hemodialysis in the  past.  Also has declined therapy for cancer. Cautious diuresis but unlikely will change overall outcome  4.  History of CVA on Plavix Currently holding antiplatelet medication.  Resume per surgery.  5.  A. fib with RVR--apical septal akinesis with thrombus 3.3X 1 cm EF 45-50% Essential hypertension. HLD. Currently on amiodarone drip which will be continued without transition at this time Not a good candidate for anticoagulation given recent history and admission for peptic ulcer perforation.--Ideally per Dr. Marlou Porch would be anticoagulated at least for 6 months but in this patient's case his preference is to withhold the same Continue Lopressor 5 mg every 6 hours  6.  Type 2 diabetes mellitus uncontrolled with hyperglycemia with renal complication without any long-term insulin use On every 6 hours insulin sliding scale insulin.  7.  Goals of care conversation. Palliative care team consulted now complete DNR no further work-up  8.  Iron deficiency. Polycythemia Patient does not have anemia and as a matter fact appears to have polycythemia. No further work-up at this time  9.  Hospital induced delirium. Family reports confusion.  Currently appears to be stable.  Will monitor.   Scheduled Meds:  chlorhexidine gluconate (MEDLINE KIT)  15 mL Mouth Rinse BID   Chlorhexidine Gluconate Cloth  6 each Topical Daily   heparin  5,000 Units Subcutaneous Q12H   insulin aspart  0-9 Units Subcutaneous Q6H   metoprolol tartrate  5 mg Intravenous Q6H   pantoprazole (PROTONIX) IV  40 mg Intravenous Q12H   Continuous Infusions:  amiodarone 30 mg/hr (02/07/21 1330)   piperacillin-tazobactam (ZOSYN)  IV 3.375 g (02/07/21  1000)   PRN Meds: acetaminophen **OR** acetaminophen, hydrALAZINE, HYDROcodone-acetaminophen, ondansetron (ZOFRAN) IV  Body mass index is 25.5 kg/m.        DVT Prophylaxis:   heparin injection 5,000 Units Start: 02/04/21 1000 SCDs Start: 01/10/2021 1553    Advance goals of  care discussion: Pt is DNR.  Family Communication: no family was present at bedside, at the time of interview.   Data Reviewed: Potassium 5.6 procalcitonin down from 16-5 Bicarb is 18 BUN and creatinine continues to rise now at 59/2.6 bilirubin is 1.6  Physical Exam:  Awake but not coherent completely Chest clear anterolaterally but poor exam N4-B0 holosystolic murmur across precordium Abdomen soft wounds not examined Trace lower extremity edema   Vitals:   02/07/21 0600 02/07/21 0727 02/07/21 1137 02/07/21 1514  BP:  (!) 159/58 (!) 145/54 (!) 189/73  Pulse: 60 65 64 76  Resp: 17 (!) 34 (!) 25 (!) 38  Temp:  98.4 F (36.9 C) 97.6 F (36.4 C) 97.6 F (36.4 C)  TempSrc:  Oral Axillary Axillary  SpO2:  95% 98% 96%  Weight:      Height:        Disposition:  Status is: Inpatient  Remains inpatient appropriate because:IV treatments appropriate due to intensity of illness or inability to take PO and Inpatient level of care appropriate due to severity of illness  Dispo: The patient is from: Home              Anticipated d/c is to: SNF              Patient currently is not medically stable to d/c.   Difficult to place patient No  Time spent: 75 minutes including care coordination time discussion with family members and staff  Verneita Griffes, MD [facility] 5:33 PM   To reach On-call, see care teams to locate the attending and reach out via [email or web address]. Between 7PM-7AM, please contact night-coverage If you still have difficulty reaching the attending provider, please page the [facility] (Director on Call) for [facility] on amion for assistance.

## 2021-02-07 NOTE — Hospital Course (Addendum)
Prior MI with angioplasty and stent LAD 1996 DM TY 2 since 2005 Abdominal aortic aneurysm status post stent repair 2007 Dr. Oneida Alar BPH HLD

## 2021-02-08 DIAGNOSIS — J9601 Acute respiratory failure with hypoxia: Secondary | ICD-10-CM | POA: Diagnosis not present

## 2021-02-08 DIAGNOSIS — Z9889 Other specified postprocedural states: Secondary | ICD-10-CM

## 2021-02-08 DIAGNOSIS — N179 Acute kidney failure, unspecified: Secondary | ICD-10-CM

## 2021-02-08 DIAGNOSIS — N189 Chronic kidney disease, unspecified: Secondary | ICD-10-CM

## 2021-02-08 DIAGNOSIS — K631 Perforation of intestine (nontraumatic): Secondary | ICD-10-CM | POA: Diagnosis not present

## 2021-02-08 DIAGNOSIS — Z515 Encounter for palliative care: Secondary | ICD-10-CM

## 2021-02-08 LAB — COMPREHENSIVE METABOLIC PANEL
ALT: 8 U/L (ref 0–44)
AST: 16 U/L (ref 15–41)
Albumin: 2.2 g/dL — ABNORMAL LOW (ref 3.5–5.0)
Alkaline Phosphatase: 77 U/L (ref 38–126)
Anion gap: 13 (ref 5–15)
BUN: 72 mg/dL — ABNORMAL HIGH (ref 8–23)
CO2: 19 mmol/L — ABNORMAL LOW (ref 22–32)
Calcium: 9.9 mg/dL (ref 8.9–10.3)
Chloride: 103 mmol/L (ref 98–111)
Creatinine, Ser: 3.01 mg/dL — ABNORMAL HIGH (ref 0.61–1.24)
GFR, Estimated: 20 mL/min — ABNORMAL LOW (ref >60)
Glucose, Bld: 142 mg/dL — ABNORMAL HIGH (ref 70–99)
Potassium: 4.7 mmol/L (ref 3.5–5.1)
Sodium: 135 mmol/L (ref 135–145)
Total Bilirubin: 1.5 mg/dL — ABNORMAL HIGH (ref 0.3–1.2)
Total Protein: 6.1 g/dL — ABNORMAL LOW (ref 6.5–8.1)

## 2021-02-08 LAB — CBC WITH DIFFERENTIAL/PLATELET
Abs Immature Granulocytes: 0.07 10*3/uL (ref 0.00–0.07)
Basophils Absolute: 0 10*3/uL (ref 0.0–0.1)
Basophils Relative: 0 %
Eosinophils Absolute: 0 10*3/uL (ref 0.0–0.5)
Eosinophils Relative: 0 %
HCT: 59.1 % — ABNORMAL HIGH (ref 39.0–52.0)
Hemoglobin: 18.6 g/dL — ABNORMAL HIGH (ref 13.0–17.0)
Immature Granulocytes: 1 %
Lymphocytes Relative: 3 %
Lymphs Abs: 0.3 10*3/uL — ABNORMAL LOW (ref 0.7–4.0)
MCH: 24.5 pg — ABNORMAL LOW (ref 26.0–34.0)
MCHC: 31.5 g/dL (ref 30.0–36.0)
MCV: 77.8 fL — ABNORMAL LOW (ref 80.0–100.0)
Monocytes Absolute: 1 10*3/uL (ref 0.1–1.0)
Monocytes Relative: 9 %
Neutro Abs: 9.3 10*3/uL — ABNORMAL HIGH (ref 1.7–7.7)
Neutrophils Relative %: 87 %
Platelets: 258 10*3/uL (ref 150–400)
RBC: 7.6 MIL/uL — ABNORMAL HIGH (ref 4.22–5.81)
RDW: 21.2 % — ABNORMAL HIGH (ref 11.5–15.5)
WBC: 10.7 10*3/uL — ABNORMAL HIGH (ref 4.0–10.5)
nRBC: 0.3 % — ABNORMAL HIGH (ref 0.0–0.2)

## 2021-02-08 LAB — GLUCOSE, CAPILLARY
Glucose-Capillary: 134 mg/dL — ABNORMAL HIGH (ref 70–99)
Glucose-Capillary: 152 mg/dL — ABNORMAL HIGH (ref 70–99)
Glucose-Capillary: 169 mg/dL — ABNORMAL HIGH (ref 70–99)

## 2021-02-08 LAB — MAGNESIUM: Magnesium: 2.3 mg/dL (ref 1.7–2.4)

## 2021-02-08 MED ORDER — BIOTENE DRY MOUTH MT LIQD: 15.0000 mL | OROMUCOSAL | Status: DC | PRN

## 2021-02-08 MED ORDER — ONDANSETRON 4 MG PO TBDP: 4.0000 mg | ORAL_TABLET | Freq: Four times a day (QID) | ORAL | Status: DC | PRN

## 2021-02-08 MED ORDER — GLYCOPYRROLATE 0.2 MG/ML IJ SOLN: 0.2000 mg | INTRAMUSCULAR | Status: DC | PRN

## 2021-02-08 MED ORDER — LORAZEPAM 2 MG/ML IJ SOLN: 1.0000 mg | INTRAMUSCULAR | Status: DC | PRN

## 2021-02-08 MED ORDER — POLYVINYL ALCOHOL 1.4 % OP SOLN
1.0000 [drp] | Freq: Four times a day (QID) | OPHTHALMIC | Status: DC | PRN
  Filled 2021-02-08: qty 15

## 2021-02-08 MED ORDER — ONDANSETRON HCL 4 MG/2ML IJ SOLN: 4.0000 mg | Freq: Four times a day (QID) | INTRAMUSCULAR | Status: DC | PRN

## 2021-02-08 MED ORDER — LORAZEPAM 1 MG PO TABS: 1.0000 mg | ORAL_TABLET | ORAL | Status: DC | PRN

## 2021-02-08 MED ORDER — GLYCOPYRROLATE 1 MG PO TABS
1.0000 mg | ORAL_TABLET | ORAL | Status: DC | PRN
  Filled 2021-02-08: qty 1

## 2021-02-08 MED ORDER — MORPHINE BOLUS VIA INFUSION
2.0000 mg | INTRAVENOUS | Status: DC | PRN
  Administered 2021-02-09: 2 mg via INTRAVENOUS
  Filled 2021-02-08: qty 2

## 2021-02-08 MED ORDER — LORAZEPAM 2 MG/ML PO CONC: 1.0000 mg | ORAL | Status: DC | PRN

## 2021-02-08 NOTE — Progress Notes (Addendum)
Brief progress note   80 white male community dwelling  Prior MI with angioplasty and stent LAD 1996 DM TY 2 since 2005 Abdominal aortic aneurysm status post stent repair 2007 Dr. Oneida Alar BPH HLD COPD, HTN, CKD 4, HLD, CVA on Plavix, DNR on admission.    Recent diagnosis large left kidney mass-conferencing multiple specialist family-patient declined work-up of mass, consideration dialysis    Presents with complaints of abdominal pain and nausea and vomiting.  Found to have bowel perforation. SP OR with Dr. Grandville Silos for exploratory laparotomy and patch repair. He developed worsening hypoxia and increasing work of breathing on 8/3 AM as below  I received a call 02/07/2021 PM regarding the patient requiring increasing amounts of oxygen and increased work of breathing despite 10 L nasal flow Confirmed with family DNR status and initiated morphine gtt.   8/4  I met at the bedside today by another daughter Juliann Pulse and the patient's granddaughter who is Kathy's daughter We had a good discussion and everyone is on the same page with regards to her transition to full comfort care  Patient can triage off of 4 N. to palliative unit if beds are needed I have initiated the comfort care order set and de-escalated all meds after long discussion with family--  I did mention that if in 48 hours Mr. Irving is [place] this, we may need to pursue freestanding hospice placement--we will reassess this in the morning based on clinical scenario and how the patient looks and I will hold on a care management consult at this time    I received a call from General surgery with regards to the patient decompensating and informed them of our palliative intent after this conversation  BP (!) 114/50 (BP Location: Right Arm)   Pulse 62   Temp 98.4 F (36.9 C) (Axillary)   Resp (!) 27   Ht 6' (1.829 m)   Wt 85.3 kg   SpO2 92%   BMI 25.50 kg/m  Arouses a little bit when I examined him Work of breathing improved  although slightly shallow S1-S2 no murmur Abdomen soft nontender No lower extremity edema extremities warm  > 35 minutes care coordination time with family and other specialists

## 2021-02-08 NOTE — Progress Notes (Signed)
PT Cancellation and Discharge  Patient Details Name: Isaiah Jackson MRN: [id number] DOB: 09/01/1933   Cancelled Treatment:    Reason Eval/Treat Not Completed: Other (comment) Per discussion with the pt's RN and chart review, the pt's family has expressed desire for full comfort care at this time. PT will sign off to align with current goals of care. Pleas feel free to re-consult if change in status/plan.  Inocencio Homes, PT, DPT   Acute Rehabilitation Department Pager #: [phone]   Otho Bellows 02/08/2021, 12:35 PM

## 2021-02-08 NOTE — Progress Notes (Signed)
Pt continues to decline, O2 Sats dropping.  Pt's family inquiring about placing pt on Morphine gtt to keep him comfortable.  Dr. Verlon Au notified and it was confirmed with pt's daughter Kenney Houseman that they wished to transition pt to comfort care at this time.  Per Dr. Verlon Au, make pt comfort care and orders were placed for Morphine gtt.

## 2021-02-08 NOTE — Progress Notes (Signed)
This chaplain responded to MD consult for spiritual care.  The chaplain understands the Pt. has transitioned to comfort care. The chaplain checked in with the Pt. RN-Melissa.  The chaplain introduced herself to the Pt. family at the bedside.  The chaplain understands the Pt. clergy has visited and the family is praying with the Pt.   A chaplain visit was declined by the family.

## 2021-02-08 NOTE — Progress Notes (Signed)
Nutrition Brief Note ° °Chart reviewed. °Pt now transitioning to comfort care.  °No nutrition interventions planned at this time.  °Please consult as needed.  ° °Kate Adriann Ballweg, MS, RD, LDN °Inpatient Clinical Dietitian °Please see AMiON for contact information. ° °

## 2021-02-08 NOTE — Progress Notes (Signed)
Daily Progress Note   Patient Name: Isaiah Jackson       Date: 02/09/2021 DOB: 21-May-1934  Age: 87 y.o. MRN#: [id number] Attending Physician: Nita Sells, MD Primary Care Physician: [name], MD Admit Date: 01/20/2021  Reason for Consultation/Follow-up: end of life care, symptom management  Subjective: Patient appears comfortable. Is is on a morphine infusion at 1 mg/hr. Unresponsive to voice and light touch. No non-verbal signs of pain or discomfort noted. Respirations are even and unlabored. No excessive respiratory secretions noted. Family present at bedside - daughter and granddaughter. Confirmed that goal of care is comfort measures only and discussed stopping any unnecessary interventions. Patient is currently on 10L high-flow oxygen. Discussed with family and bedside RN weaning this down to room air as tolerated.  Education and counseling provided to family on expectations at EOL. Emotional support provided.      Length of Stay: 6  Current Medications:  Continuous Infusions:  morphine 2 mg/hr (02/09/21 0600)    PRN Meds: acetaminophen **OR** acetaminophen, antiseptic oral rinse, glycopyrrolate **OR** glycopyrrolate **OR** glycopyrrolate, LORazepam **OR** LORazepam **OR** LORazepam, morphine, ondansetron **OR** ondansetron (ZOFRAN) IV, polyvinyl alcohol       Vital Signs: BP (!) 142/67 (BP Location: Right Arm)   Pulse 75   Temp 97.8 F (36.6 C) (Oral)   Resp (!) 26   Ht 6' (1.829 m)   Wt 85.3 kg   SpO2 98%   BMI 25.50 kg/m  SpO2: SpO2: 98 % O2 Device: O2 Device: Nasal Cannula (10 liters)   Intake/output summary:  Intake/Output Summary (Last 24 hours) at 02/09/2021 1041 Last data filed at 02/09/2021 9518 Gross per 24 hour  Intake 202.96 ml  Output 210  ml  Net -7.04 ml   LBM: Last BM Date: 02/08/21 Baseline Weight: Weight: 81.6 kg Most recent weight: Weight: 85.3 kg       Palliative Assessment/Data: PPS 10%      Palliative Care Assessment & Plan   Patient Profile: Per intake H&P --> Past medical history of COPD, HTN, CKD 4, renal cancer, HLD, CAD, CVA on Plavix, DNR on admission.  Presents with complaints of abdominal pain and nausea and vomiting.  Found to have bowel perforation. SP OR with Dr. Grandville Silos for exploratory laparotomy and patch repair. NPO presently. Palliative care has been asked to get involved  to further discuss goals of care in the setting of multiple co-morbidities and acute illness.    Assessment: - recent diagnosis of large left kidney mass, family declined work-up - bowel perforation s/p exploratory laparotomy and patch repair - acute hypoxic respiratory failure post-op - transition to comfort care 8/3  Recommendations/Plan: Full comfort measures  DNR/DNI as previously documented Continue morphine infusion - added an order for PRN bolus dose via infusion Wean oxygen down to room air as tolerated  Stop CBG monitoring and subQ heparin PRN medications are available for symptom management at EOL PMT will continue to follow    Goals of Care and Additional Recommendations: Limitations on Scope of Treatment: Full Comfort Care  Code Status: DNR  Prognosis:  Hours - Days  Discharge Planning: To Be Determined - anticipate hospital death    Thank you for allowing the Palliative Medicine Team to assist in the care of this patient.   Total Time 25 minutes Prolonged Time Billed  no       Greater than 50%  of this time was spent counseling and coordinating care related to the above assessment and plan.  Lavena Bullion, NP  Please contact Palliative Medicine Team phone at [phone] for questions and concerns.

## 2021-02-08 NOTE — Progress Notes (Signed)
Trauma/Critical Care Follow Up Note  Subjective:    Overnight Issues:   Objective:  Vital signs for last 24 hours: Temp:  [97.6 F (36.4 C)-98.2 F (36.8 C)] 97.7 F (36.5 C) (08/04 0728) Pulse Rate:  [63-76] 63 (08/04 0728) Resp:  [24-38] 24 (08/04 0728) BP: (104-189)/(54-73) 104/54 (08/04 0728) SpO2:  [89 %-100 %] 97 % (08/04 0728)  Hemodynamic parameters for last 24 hours:    Intake/Output from previous day: 08/03 0701 - 08/04 0700 In: 599.7 [P.O.:20; I.V.:418.7; IV Piggyback:161.1] Out: 1075 [Urine:1000; Drains:75]  Intake/Output this shift: No intake/output data recorded.  Vent settings for last 24 hours:    Physical Exam:  Gen: ill-appearing, no distress Neuro: unresponsive HEENT: PERRL Neck: supple CV: RRR Pulm: unlabored breathing on HFNC Abd: soft, NT, midline wound WTD Extr: wwp, no edema   Results for orders placed or performed during the hospital encounter of 01/18/2021 (from the past 24 hour(s))  Glucose, capillary     Status: Abnormal   Collection Time: 02/07/21 11:36 AM  Result Value Ref Range   Glucose-Capillary 164 (H) 70 - 99 mg/dL  Glucose, capillary     Status: Abnormal   Collection Time: 02/07/21  4:32 PM  Result Value Ref Range   Glucose-Capillary 165 (H) 70 - 99 mg/dL  Glucose, capillary     Status: Abnormal   Collection Time: 02/08/21 12:24 AM  Result Value Ref Range   Glucose-Capillary 169 (H) 70 - 99 mg/dL  Comprehensive metabolic panel     Status: Abnormal   Collection Time: 02/08/21  4:12 AM  Result Value Ref Range   Sodium 135 135 - 145 mmol/L   Potassium 4.7 3.5 - 5.1 mmol/L   Chloride 103 98 - 111 mmol/L   CO2 19 (L) 22 - 32 mmol/L   Glucose, Bld 142 (H) 70 - 99 mg/dL   BUN 72 (H) 8 - 23 mg/dL   Creatinine, Ser 3.01 (H) 0.61 - 1.24 mg/dL   Calcium 9.9 8.9 - 10.3 mg/dL   Total Protein 6.1 (L) 6.5 - 8.1 g/dL   Albumin 2.2 (L) 3.5 - 5.0 g/dL   AST 16 15 - 41 U/L   ALT 8 0 - 44 U/L   Alkaline Phosphatase 77 38 - 126 U/L    Total Bilirubin 1.5 (H) 0.3 - 1.2 mg/dL   GFR, Estimated 20 (L) >60 mL/min   Anion gap 13 5 - 15  Magnesium     Status: None   Collection Time: 02/08/21  4:12 AM  Result Value Ref Range   Magnesium 2.3 1.7 - 2.4 mg/dL  CBC with Differential/Platelet     Status: Abnormal   Collection Time: 02/08/21  4:12 AM  Result Value Ref Range   WBC 10.7 (H) 4.0 - 10.5 K/uL   RBC 7.60 (H) 4.22 - 5.81 MIL/uL   Hemoglobin 18.6 (H) 13.0 - 17.0 g/dL   HCT 59.1 (H) 39.0 - 52.0 %   MCV 77.8 (L) 80.0 - 100.0 fL   MCH 24.5 (L) 26.0 - 34.0 pg   MCHC 31.5 30.0 - 36.0 g/dL   RDW 21.2 (H) 11.5 - 15.5 %   Platelets 258 150 - 400 K/uL   nRBC 0.3 (H) 0.0 - 0.2 %   Neutrophils Relative % 87 %   Neutro Abs 9.3 (H) 1.7 - 7.7 K/uL   Lymphocytes Relative 3 %   Lymphs Abs 0.3 (L) 0.7 - 4.0 K/uL   Monocytes Relative 9 %   Monocytes Absolute 1.0  0.1 - 1.0 K/uL   Eosinophils Relative 0 %   Eosinophils Absolute 0.0 0.0 - 0.5 K/uL   Basophils Relative 0 %   Basophils Absolute 0.0 0.0 - 0.1 K/uL   Immature Granulocytes 1 %   Abs Immature Granulocytes 0.07 0.00 - 0.07 K/uL  Glucose, capillary     Status: Abnormal   Collection Time: 02/08/21  5:59 AM  Result Value Ref Range   Glucose-Capillary 134 (H) 70 - 99 mg/dL    Assessment & Plan:  Present on Admission: **None**    LOS: 5 days   Additional comments:I reviewed the patient's new clinical lab test results.   and I reviewed the patients new imaging test results.    POD5, S/P repair and patch perforated pyloric ulcer by Dr. Grandville Silos 7/30  - okay for clear liquids from a surgical standpoint, but does not appear appropriate neurologically to take PO - protonix BID - okay to d/c zosyn - NS WD dressing changes BID - cont JP drain   FEN - NGT/IVFs, NPO VTE - SQH ID - zosyn, d/c today if okay with primary team   Acute hypoxic ventilator dependent respiratory failure -extubated AI - per primary COPD CAD DM HLD HTN RCC  Patient noted to be DNR  and primary team started a morphine drip o/n. Recommend consideration of transition to full comfort care.   Isaiah Oka, MD Trauma & General Surgery Please use [email or web address] to contact on call provider  02/08/2021  *Care during the described time interval was provided by me. I have reviewed this patient's available data, including medical history, events of note, physical examination and test results as part of my evaluation.

## 2021-02-09 DIAGNOSIS — J9601 Acute respiratory failure with hypoxia: Secondary | ICD-10-CM | POA: Diagnosis not present

## 2021-02-09 DIAGNOSIS — Z66 Do not resuscitate: Secondary | ICD-10-CM | POA: Diagnosis not present

## 2021-02-09 DIAGNOSIS — N179 Acute kidney failure, unspecified: Secondary | ICD-10-CM | POA: Diagnosis not present

## 2021-02-09 DIAGNOSIS — K631 Perforation of intestine (nontraumatic): Secondary | ICD-10-CM | POA: Diagnosis not present

## 2021-02-09 NOTE — Progress Notes (Signed)
[facility] ([facility]) Hospital Liaison note.    Received request from TOC manager for family interest in [facility]. Spoke with family to confirm interest and answer questions. [facility] is unable to offer a room today. Hospital Liaison will follow up tomorrow or sooner if a room becomes available.  Eligibility is confirmed.   Please do not hesitate to call with questions.    Thank you,   Mary Anne Robertson, RN, CCM      [facility] Hospital Liaison   [phone] 

## 2021-02-09 NOTE — TOC Progression Note (Signed)
Transition of Care [facility]) - Progression Note    Patient Details  Name: Isaiah Jackson MRN: [id number] Date of Birth: Jun 15, 1934  [facility] CM/SW Contact  Shavonna Corella, Edson Snowball, RN Phone Number: 02/09/2021, 1:51 PM  Clinical Narrative:     Spoke to patient's daughters Kenney Houseman and Juliann Pulse at bedside. Discussed residential hospice. Offered choice. They prefer [facility] . Referral made to [facility] with Audrea Muscat. Audrea Muscat will review clinical . Daughter Celedonio Miyamoto [phone] is contact person. Await determination from Donald Prose   Expected Discharge Plan: [place] Barriers to Discharge: Continued Medical Work up  Expected Discharge Plan and Services Expected Discharge Plan: [place] In-house Referral: Hospice / Palliative Care Discharge Planning Services: CM Consult Post Acute Care Choice: Home Health, Durable Medical Equipment Living arrangements for the past 2 months: Single Family Home                 DME Arranged: Bedside commode (Please follow for orders.) DME Agency: [employer] Date DME Agency Contacted: 02/06/21 Time DME Agency Contacted: 1500 Representative spoke with at DME Agency: Freda Munro HH Arranged: RN, Disease Management, PT [place] Agency: [place] Date [place]: 02/06/21 Time [place]: 1504 Representative spoke with at [place]: [place] ([place]) Interventions    Readmission Risk Interventions No flowsheet data found.

## 2021-02-09 NOTE — Progress Notes (Signed)
Patient ID: Isaiah Jackson, male   DOB: 08-Feb-1934, 87 y.o.   MRN: [id number] 6 Days Post-Op   Subjective: Talking but not intelligible ROS negative except as listed above. Objective: Vital signs in last 24 hours: Temp:  [97.8 F (36.6 C)-98.4 F (36.9 C)] 97.8 F (36.6 C) (08/05 0525) Pulse Rate:  [62-75] 75 (08/05 0525) Resp:  [21-27] 26 (08/05 0525) BP: (114-151)/(50-72) 142/67 (08/05 0525) SpO2:  [92 %-98 %] 98 % (08/05 0525) Last BM Date: 02/08/21  Intake/Output from previous day: 08/04 0701 - 08/05 0700 In: 203 [I.V.:203] Out: 210 [Urine:150; Drains:60] Intake/Output this shift: No intake/output data recorded.  GI: soft, JP SS< wound OK  Lab Results: CBC  Recent Labs    02/07/21 0228 02/08/21 0412  WBC 11.4* 10.7*  HGB 18.5* 18.6*  HCT 60.4* 59.1*  PLT 268 258   BMET Recent Labs    02/07/21 0228 02/08/21 0412  NA 135 135  K 5.6* 4.7  CL 100 103  CO2 18* 19*  GLUCOSE 159* 142*  BUN 59* 72*  CREATININE 2.60* 3.01*  CALCIUM 10.3 9.9   PT/INR No results for input(s): LABPROT, INR in the last 72 hours. ABG No results for input(s): PHART, HCO3 in the last 72 hours.  Invalid input(s): PCO2, PO2  Studies/Results:   Anti-infectives: Anti-infectives (From admission, onward)    Start     Dose/Rate Route Frequency Ordered Stop   02/04/21 1100  piperacillin-tazobactam (ZOSYN) IVPB 3.375 g  Status:  Discontinued        3.375 g 12.5 mL/hr over 240 Minutes Intravenous Every 8 hours 02/04/21 1013 02/08/21 1322   02/02/2021 1345  piperacillin-tazobactam (ZOSYN) IVPB 2.25 g  Status:  Discontinued        2.25 g 100 mL/hr over 30 Minutes Intravenous Every 8 hours 01/16/2021 1341 02/04/21 1013       Assessment/Plan: POD5, S/P repair and patch perforated pyloric ulcer by Dr. Grandville Silos 7/30  - okay for clear liquids from a surgical standpoint if able - protonix BID - NS WD dressing changes BID - cont JP drain   FEN - NGT/IVFs, NPO VTE - SQH ID - zosyn  completed   Comfort care noted  AI - per primary COPD CAD DM HLD HTN RCC   LOS: 6 days    Georganna Skeans, MD, MPH, FACS Trauma & General Surgery Use [email or web address] to contact on call provider  02/09/2021

## 2021-02-09 NOTE — Progress Notes (Signed)
Daily Progress Note   Patient Name: Isaiah Jackson       Date: 02/09/2021 DOB: [birth year]  Age: 87 y.o. MRN#: [id number] Attending Physician: Nita Sells, MD Primary Care Physician: [name], MD Admit Date: 01/15/2021  Reason for Consultation/Follow-up: end of life care, symptom management  Subjective: Patient appears comfortable. Morphine infusion is at 2 mg/hr. He is awake and asks me for a sip of water. Respirations are even and unlabored. No excessive respiratory secretions noted. I note that his oxygen remains on 5L. I turned this down to 3L and administered a bolus of morphine via the infusion (1 mg over 2 minutes).   Daughter Juliann Pulse present at bedside. She reports that she has been here most of the day and patient has eaten only a few bites of jello and a few sips of broth. However he has complained of thirst and has been drinking water throughout the day.  Juliann Pulse confirms that family has agreed to transfer him to [facility] when a bed becomes available.  Education and counseling provided on expectations at EOL. Emotional support provided.     Length of Stay: 6  Current Medications: Scheduled Meds:    Continuous Infusions:  morphine 2 mg/hr (02/09/21 0600)    PRN Meds: acetaminophen **OR** acetaminophen, antiseptic oral rinse, glycopyrrolate **OR** glycopyrrolate **OR** glycopyrrolate, LORazepam **OR** LORazepam **OR** LORazepam, morphine, ondansetron **OR** ondansetron (ZOFRAN) IV, polyvinyl alcohol       Vital Signs: BP (!) 142/67 (BP Location: Right Arm)   Pulse 75   Temp 97.8 F (36.6 C) (Oral)   Resp (!) 26   Ht 6' (1.829 m)   Wt 85.3 kg   SpO2 98%   BMI 25.50 kg/m  SpO2: SpO2: 98 % O2 Device: O2 Device: Nasal Cannula   Intake/output summary:   Intake/Output Summary (Last 24 hours) at 02/09/2021 1714 Last data filed at 02/09/2021 4098 Gross per 24 hour  Intake 202.96 ml  Output 40 ml  Net 162.96 ml   LBM: Last BM Date: 02/08/21 Baseline Weight: Weight: 81.6 kg Most recent weight: Weight: 85.3 kg       Palliative Assessment/Data: PPS 20%      Palliative Care Assessment & Plan   Patient Profile: Per intake H&P --> Past medical history of COPD, HTN, CKD 4, renal cancer, HLD, CAD, CVA  on Plavix, DNR on admission.  Presents with complaints of abdominal pain and nausea and vomiting.  Found to have bowel perforation. SP OR with Dr. Grandville Silos for exploratory laparotomy and patch repair. NPO presently. Palliative care has been asked to get involved to further discuss goals of care in the setting of multiple co-morbidities and acute illness.   Assessment: - recent diagnosis of large left kidney mass, family declined work-up - bowel perforation s/p exploratory laparotomy and patch repair - acute hypoxic respiratory failure post-op - transition to comfort care 8/3   Recommendations/Plan: Full comfort measures  DNR/DNI as previously documented Continue morphine infusion  Wean oxygen down to as tolerated Transfer to [facility] when a bed is available PRN medications are available for symptom management at EOL PMT will continue to follow   Goals of Care and Additional Recommendations: Limitations on Scope of Treatment: Full Comfort Care  Code Status: DNR  Prognosis:  < 2 weeks  Discharge Planning: Hospice facility  Care plan was discussed with Dr. Verlon Au  Thank you for allowing the Palliative Medicine Team to assist in the care of this patient.   Total Time 20 minutes Prolonged Time Billed  no       Greater than 50%  of this time was spent counseling and coordinating care related to the above assessment and plan.  Lavena Bullion, NP  Please contact Palliative Medicine Team phone at [phone] for questions and  concerns.

## 2021-02-09 NOTE — Progress Notes (Signed)
[facility] Progress Note  Patient: Isaiah Jackson    IPJ:[id number]  DOA: 01/22/2021     Date of Service: the patient was seen and examined on 02/09/2021  Brief hospital course: 36 white male community dwelling  Prior MI with angioplasty and stent LAD 1996 DM TY 2 since 2005 Abdominal aortic aneurysm status post stent repair 2007 Dr. Oneida Alar BPH HLD COPD, HTN, CKD 4, HLD, CVA on Plavix, DNR on admission.   Recent diagnosis large left kidney mass-conferencing multiple specialist family-patient declined work-up of mass, consideration dialysis   Presents with complaints of abdominal pain and nausea and vomiting.  Found to have bowel perforation. SP OR with Dr. Grandville Silos for exploratory laparotomy and patch repair. He developed worsening hypoxia and increasing work of breathing on 8/3 AM Patient was transition to full comfort measures subsequently after several days of discussion with family  Subjective:  More responsive although quite encephalopathic cannot make what he wants to say known clearly-he seems more alert than he was yesterday and it now does not appear to be on the verge of dying  Assessment and Plan: 1.  Acute respiratory failure with hypoxia requiring intubation postprocedure Patient has developed worsening hypoxia, CXR = effusion-Rx Lasix X1 40 and then 20 mg later today Continued discussions with family today yielded that I think he has a longer prognosis then several days and this may not be a hospital death-appreciate TOC coordinating hospice facility transfer and discussed this clearly with family  2.  Recent exploratory laparotomy with patch repair. Perforated peptic ulcer. Management per general surgery. De-escalate all meds not related to comfort at this time  3.  Chronic kidney disease stage IV. History of renal cancer. Diuresis discontinued and had been stopped at the time of changing CODE STATUS and discussing with family  4.  History of CVA on  Plavix  5.  A. fib with RVR--apical septal akinesis with thrombus 3.3X 1 cm EF 45-50% Essential hypertension. HLD. Amiodarone drip discontinued patient very poor candidate for anticoagulation  6.  Type 2 diabetes mellitus uncontrolled with hyperglycemia with renal complication without any long-term insulin use On every 6 hours insulin sliding scale insulin.  7.  Goals of care conversation. Palliative care team consulted now complete DNR no further work-up We have had several detailed discussions Palliative care order set has been used including comfort related meds such as Robinul Ativan morphine gtt. and I have left an order for nursing staff to titrate morphine to comfort after discussion with family who feel he is a little bit uncomfortable awake incoherent Shallow breathing no increased  Scheduled Meds:   Continuous Infusions:  morphine 2 mg/hr (02/09/21 1822)   PRN Meds: acetaminophen **OR** acetaminophen, antiseptic oral rinse, glycopyrrolate **OR** glycopyrrolate **OR** glycopyrrolate, LORazepam **OR** LORazepam **OR** LORazepam, morphine, ondansetron **OR** ondansetron (ZOFRAN) IV, polyvinyl alcohol  Body mass index is 25.5 kg/m.        Advance goals of care discussion: Pt is DNR.   Physical Exam:  Awake alert no distress, does have shallow breathing however does not have increased abdomen is soft nontender Foley is in place Lower extremities are soft work of breathing  Warm and perfusing    Vitals:   02/08/21 1150 02/08/21 2000 02/08/21 2134 02/09/21 0525  BP: (!) 114/50  (!) 151/72 (!) 142/67  Pulse: 62 70 74 75  Resp: (!) 27 (!) 21 (!) 23 (!) 26  Temp: 98.4 F (36.9 C)   97.8 F (36.6 C)  TempSrc: Axillary  Oral  SpO2: 92% 98% 97% 98%  Weight:      Height:        Disposition:  Status is: Inpatient  Remains inpatient appropriate because:IV treatments appropriate due to intensity of illness or inability to take PO and Inpatient level of care  appropriate due to severity of illness  Dispo: The patient is from: Home              Anticipated d/c is to:  [facility]              Patient currently is medically stable to d/c.   Difficult to place patient No  Time spent: 57  Verneita Griffes, MD [facility] 6:33 PM   To reach On-call, see care teams to locate the attending and reach out via [email or web address]. Between 7PM-7AM, please contact night-coverage If you still have difficulty reaching the attending provider, please page the [facility] (Director on Call) for [facility] on amion for assistance.

## 2021-02-10 DIAGNOSIS — K631 Perforation of intestine (nontraumatic): Secondary | ICD-10-CM | POA: Diagnosis not present

## 2021-02-10 DIAGNOSIS — J9601 Acute respiratory failure with hypoxia: Secondary | ICD-10-CM | POA: Diagnosis not present

## 2021-02-10 DIAGNOSIS — Z515 Encounter for palliative care: Secondary | ICD-10-CM | POA: Diagnosis not present

## 2021-02-10 DIAGNOSIS — N179 Acute kidney failure, unspecified: Secondary | ICD-10-CM | POA: Diagnosis not present

## 2021-02-10 NOTE — Progress Notes (Addendum)
[facility]) Hospital Liaison RN Note  Unfortunately [place] is not able to offer a room today.   Family and TOC aware hospital liaison will follow up tomorrow or sooner if a room becomes available.   Please do not hesitate to call with any hospice related questions.   Thank you,   Bobbie "Loren Racer, [place], BSN [facility] Liaison [phone]

## 2021-02-10 NOTE — Progress Notes (Addendum)
[facility] Progress Note  Patient: Isaiah Jackson    HYQ:[id number]  DOA: 01/24/2021     Date of Service: the patient was seen and examined on 02/10/2021  Brief hospital course: 75 white male community dwelling  Prior MI with angioplasty and stent LAD 1996 DM TY 2 since 2005 Abdominal aortic aneurysm status post stent repair 2007 Dr. Oneida Alar BPH HLD COPD, HTN, CKD 4, HLD, CVA on Plavix, DNR on admission.   Recent diagnosis large left kidney mass-conferencing multiple specialist family-patient declined work-up of mass, consideration dialysis  Presents with complaints of abdominal pain and nausea and vomiting.  Found to have bowel perforation. SP OR with Dr. Grandville Silos for exploratory laparotomy and patch repair. He developed worsening hypoxia and increasing work of breathing on 8/3 AM Patient was transition to full comfort measures subsequently after several days of discussion with family  Subjective:  Yesterday vacillation in his level of coherency--he was more alert. Daughter concerned about if we are making "the right choice" I sat down with her and her daughters and we discussed that objectively although he may make some improvements short-term, long-term his overall prognosis is poor We had a good discussion about this and they all feel comfortable about palliative trajectory  Assessment and Plan: 1.  Acute respiratory failure with hypoxia requiring intubation postprocedure Patient has developed worsening hypoxia, CXR = effusion--appreciate TOC coordinating hospice facility transfer and discussed this clearly with family 2.  Recent exploratory laparotomy with patch repair. Perforated peptic ulcer. Management per general surgery. De-escalate all meds not related to comfort at this time 3.  Chronic kidney disease stage IV. History of renal cancer. Diuresis discontinued  4.  History of CVA on Plavix 5.  A. fib with RVR--apical septal akinesis with thrombus 3.3X 1 cm EF  45-50% Essential hypertension. HLD. Amiodarone drip discontinued 6.  Type 2 diabetes mellitus uncontrolled with hyperglycemia with renal complication without any long-term insulin use  7.  Goals of care conversation. Palliative care team consulted now complete DNR no further work-up several detailed discussions Palliative care order set has been used including comfort related meds such as Robinul Ativan morphine gtt. and I have left an order for nursing staff to titrate morphine to comfort after discussion with family who feel he is a little bit uncomfortable awake incoherent Shallow breathing no increased  Scheduled Meds:   Continuous Infusions:  morphine 2 mg/hr (02/09/21 1822)   PRN Meds: acetaminophen **OR** acetaminophen, antiseptic oral rinse, glycopyrrolate **OR** glycopyrrolate **OR** glycopyrrolate, LORazepam **OR** LORazepam **OR** LORazepam, morphine, ondansetron **OR** ondansetron (ZOFRAN) IV, polyvinyl alcohol  Body mass index is 25.5 kg/m.        Advance goals of care discussion: Pt is DNR.   Physical Exam:  Awake coherent no distress lethargic but interactive at times--nonmeaningful Chest clear no added sound Abdomen soft no rebound    Vitals:   02/08/21 2000 02/08/21 2134 02/09/21 0525 02/10/21 0431  BP:  (!) 151/72 (!) 142/67 130/61  Pulse: 70 74 75 78  Resp: (!) 21 (!) 23 (!) 26 18  Temp:   97.8 F (36.6 C) (!) 97.5 F (36.4 C)  TempSrc:   Oral Oral  SpO2: 98% 97% 98% 96%  Weight:      Height:        Disposition:  Status is: Inpatient Remains inpatient appropriate because:IV treatments appropriate due to intensity of illness or inability to take PO and Inpatient level of care appropriate due to severity of illness  Dispo: The patient is  from: Home              Anticipated d/c is to:  Freestanding hospice when available              Patient currently is medically stable to d/c.   Difficult to place patient No Time spent: 25  Verneita Griffes,  MD [facility] 11:09 AM

## 2021-02-10 NOTE — Progress Notes (Signed)
Daily Progress Note   Patient Name: Isaiah Jackson       Date: 02/10/2021 DOB: 11/11/1933  Age: 87 y.o. MRN#: [id number] Attending Physician: Nita Sells, MD Primary Care Physician: [name], MD Admit Date: 01/13/2021  Reason for Consultation/Follow-up: end of life care, symptom management  Subjective: Patient appears comfortable. Morphine infusion is at 2 mg/hr. He is unresponsive to voice and light touch. No non-verbal signs of pain or discomfort noted. Respirations are even and unlabored. I decreased oxygen from 3L to 2L. Mild respiratory secretions noted. No family at bedside presently.   Length of Stay: 7  Current Medications: Scheduled Meds:    Continuous Infusions:  morphine 2 mg/hr (02/09/21 1822)    PRN Meds: acetaminophen **OR** acetaminophen, antiseptic oral rinse, glycopyrrolate **OR** glycopyrrolate **OR** glycopyrrolate, LORazepam **OR** LORazepam **OR** LORazepam, morphine, ondansetron **OR** ondansetron (ZOFRAN) IV, polyvinyl alcohol       Vital Signs: BP 130/61 (BP Location: Right Arm)   Pulse 78   Temp (!) 97.5 F (36.4 C) (Oral)   Resp 18   Ht 6' (1.829 m)   Wt 85.3 kg   SpO2 96%   BMI 25.50 kg/m  SpO2: SpO2: 96 % O2 Device: O2 Device: Nasal Cannula   Intake/output summary:  Intake/Output Summary (Last 24 hours) at 02/10/2021 1417 Last data filed at 02/10/2021 0900 Gross per 24 hour  Intake 325.75 ml  Output 1405 ml  Net -1079.25 ml   LBM: Last BM Date: 02/08/21 Baseline Weight: Weight: 81.6 kg Most recent weight: Weight: 85.3 kg       Palliative Assessment/Data: PPS 10%      Palliative Care Assessment & Plan   Patient Profile: Per intake H&P --> Past medical history of COPD, HTN, CKD 4, renal cancer, HLD, CAD, CVA on  Plavix, DNR on admission.  Presents with complaints of abdominal pain and nausea and vomiting.  Found to have bowel perforation. SP OR with Dr. Grandville Silos for exploratory laparotomy and patch repair. NPO presently. Palliative care has been asked to get involved to further discuss goals of care in the setting of multiple co-morbidities and acute illness.   Assessment: - recent diagnosis of large left kidney mass, family declined work-up - bowel perforation s/p exploratory laparotomy and patch repair - acute hypoxic respiratory failure post-op - transition to comfort care 8/3  Recommendations/Plan: Full comfort measures  Continue morphine infusion Transfer to [facility] when a bed is available PRN medications are available for symptom management at EOL PMT will continue to follow   Goals of Care and Additional Recommendations: Limitations on Scope of Treatment: Full Comfort Care  Code Status: DNR/DNI  Prognosis:  < 2 weeks  Discharge Planning: Hospice facility   Thank you for allowing the Palliative Medicine Team to assist in the care of this patient.   Total Time 15 minutes Prolonged Time Billed  no       Greater than 50%  of this time was spent counseling and coordinating care related to the above assessment and plan.  Lavena Bullion, NP  Please contact Palliative Medicine Team phone at [phone] for questions and concerns.

## 2021-02-11 DIAGNOSIS — Z515 Encounter for palliative care: Secondary | ICD-10-CM

## 2021-02-11 DIAGNOSIS — J9601 Acute respiratory failure with hypoxia: Secondary | ICD-10-CM | POA: Diagnosis not present

## 2021-02-11 DIAGNOSIS — N179 Acute kidney failure, unspecified: Secondary | ICD-10-CM | POA: Diagnosis not present

## 2021-02-11 DIAGNOSIS — K631 Perforation of intestine (nontraumatic): Secondary | ICD-10-CM | POA: Diagnosis not present

## 2021-02-11 NOTE — Progress Notes (Signed)
PROGRESS NOTE    Isaiah Jackson  ONG:[id number] DOB: 1934/02/04 DOA: 02/04/2021 PCP: [name], MD   Brief Narrative: Isaiah Jackson is a 87 y.o. male with a history of with history of MI with angioplasty/stent, diabetes mellitus type 2, aortic aneurysm status post stent repair, BPH, hyperlipidemia, COPD, hypertension, CKD stage IV, hyperlipidemia, CVA on Plavix.  Patient presented secondary to abdominal pain, nausea, vomiting and was found to have bowel perforation.  General surgery performed emergent exploratory laparotomy with patch repair.  Post extubation from procedure, patient developed worsening dyspnea and respiratory distress requiring reintubation and ICU admission.  He was briefly intubated for about 1 day before extubation.  During hospitalization, patient continued to have worsening hypoxia with eventual transition to comfort measures.  Patient is now on morphine drip with plans to transfer to hospice facility if bed is available.   Assessment & Plan:   Principal Problem:   Bowel perforation (HCC) Active Problems:   Hypertension   CKD stage 4 due to type 2 diabetes mellitus ([place])   Status post surgery   Comfort measures only status   Acute respiratory failure with hypoxia Developed postextubation and after surgery, on 7/30.  Patient was reintubated and admitted to the ICU.  Patient was managed on ventilator for about 1 day and was extubated on 7/31.  Patient was initially managed on BiPAP and was weaned to nasal cannula.  Perforated ulcer Patient underwent exploratory laparotomy with repair on admission on 7/30.  AKI on CKD stage IV Baseline creatinine of about 2.6.  While admitted, patient's creatinine has been labile but is trending up prior to transition to comfort measures.  No treatment.  Renal cell carcinoma Noted  Goals of care Palliative care has been consulted and patient has now been transferred addition to full comfort measures.  Patient started  on morphine drip with plans to [facility] once a bed is available.  History of CVA Atrial fibrillation with RVR Essential hypertension Hyperlipidemia Type 2 diabetes mellitus    DVT prophylaxis: None Code Status:   Code Status: DNR Family Communication: 2 daughters and granddaughter at bedside Disposition Plan: Discharge to hospice facility versus in-hospital death   Consultants:  PCCM General surgery Palliative care medicine  Procedures:    Antimicrobials: Zosyn IV   Subjective: No issues overnight.  Objective: Vitals:   02/08/21 2134 02/09/21 0525 02/10/21 0431 02/11/21 0439  BP: (!) 151/72 (!) 142/67 130/61 (!) 141/66  Pulse: 74 75 78 79  Resp: (!) 23 (!) 26 18 18   Temp:  97.8 F (36.6 C) (!) 97.5 F (36.4 C) 98 F (36.7 C)  TempSrc:  Oral Oral Oral  SpO2: 97% 98% 96% 91%  Weight:      Height:        Intake/Output Summary (Last 24 hours) at 02/11/2021 1344 Last data filed at 02/11/2021 0930 Gross per 24 hour  Intake 99.26 ml  Output 1100 ml  Net -1000.74 ml   Filed Weights   01/23/2021 1113 02/01/2021 1443  Weight: 81.6 kg 85.3 kg    Examination:  General exam: Appears calm and comfortable Respiratory system: Clear to auscultation. Respiratory effort normal. Cardiovascular system: S1 & S2 heard, RRR. No murmurs, rubs, gallops or clicks. Gastrointestinal system: Abdomen is significantly tender on palpation Central nervous system: Alert    Data Reviewed: I have personally reviewed following labs and imaging studies  CBC Lab Results  Component Value Date   WBC 10.7 (H) 02/08/2021   RBC  7.60 (H) 02/08/2021   HGB 18.6 (H) 02/08/2021   HCT 59.1 (H) 02/08/2021   MCV 77.8 (L) 02/08/2021   MCH 24.5 (L) 02/08/2021   PLT 258 02/08/2021   MCHC 31.5 02/08/2021   RDW 21.2 (H) 02/08/2021   LYMPHSABS 0.3 (L) 02/08/2021   MONOABS 1.0 02/08/2021   EOSABS 0.0 02/08/2021   BASOSABS 0.0 22/97/9892     Last metabolic panel Lab Results   Component Value Date   NA 135 02/08/2021   K 4.7 02/08/2021   CL 103 02/08/2021   CO2 19 (L) 02/08/2021   BUN 72 (H) 02/08/2021   CREATININE 3.01 (H) 02/08/2021   GLUCOSE 142 (H) 02/08/2021   GFRNONAA 20 (L) 02/08/2021   GFRAA 23 (L) 12/06/2020   CALCIUM 9.9 02/08/2021   PHOS 3.7 02/05/2021   PROT 6.1 (L) 02/08/2021   ALBUMIN 2.2 (L) 02/08/2021   BILITOT 1.5 (H) 02/08/2021   ALKPHOS 77 02/08/2021   AST 16 02/08/2021   ALT 8 02/08/2021   ANIONGAP 13 02/08/2021    CBG (last 3)  No results for input(s): GLUCAP in the last 72 hours.   GFR: Estimated Creatinine Clearance: 19.7 mL/min (A) (by C-G formula based on SCr of 3.01 mg/dL (H)).  Coagulation Profile: No results for input(s): INR, PROTIME in the last 168 hours.  Recent Results (from the past 240 hour(s))  Urine Culture     Status: Abnormal   Collection Time: 01/26/2021 11:35 AM   Specimen: Urine, Clean Catch  Result Value Ref Range Status   Specimen Description URINE, CLEAN CATCH  Final   Special Requests NONE  Final   Culture (A)  Final    <10,000 COLONIES/mL INSIGNIFICANT GROWTH Performed at [facility], [address], [place], [place] [address]    Report Status 02/04/2021 FINAL  Final  Resp Panel by RT-PCR (Flu A&B, Covid) Nasopharyngeal Swab     Status: None   Collection Time: 01/15/2021  1:28 PM   Specimen: Nasopharyngeal Swab; Nasopharyngeal(NP) swabs in vial transport medium  Result Value Ref Range Status   SARS Coronavirus 2 by RT PCR NEGATIVE NEGATIVE Final    Comment: (NOTE) SARS-CoV-2 target nucleic acids are NOT DETECTED.  The SARS-CoV-2 RNA is generally detectable in upper respiratory specimens during the acute phase of infection. The lowest concentration of SARS-CoV-2 viral copies this assay can detect is 138 copies/mL. A negative result does not preclude SARS-Cov-2 infection and should not be used as the sole basis for treatment or other patient management decisions. A negative result may  occur with  improper specimen collection/handling, submission of specimen other than nasopharyngeal swab, presence of viral mutation(s) within the areas targeted by this assay, and inadequate number of viral copies(<138 copies/mL). A negative result must be combined with clinical observations, patient history, and epidemiological information. The expected result is Negative.  Fact Sheet for Patients:  EntrepreneurPulse.com.au  Fact Sheet for Healthcare Providers:  IncredibleEmployment.be  This test is no t yet approved or cleared by the [place] FDA and  has been authorized for detection and/or diagnosis of SARS-CoV-2 by FDA under an Emergency Use Authorization (EUA). This EUA will remain  in effect (meaning this test can be used) for the duration of the COVID-19 declaration under Section 564(b)(1) of the Act, 21 U.S.C.section 360bbb-3(b)(1), unless the authorization is terminated  or revoked sooner.       Influenza A by PCR NEGATIVE NEGATIVE Final   Influenza B by PCR NEGATIVE NEGATIVE Final  Comment: (NOTE) The Xpert Xpress SARS-CoV-2/FLU/RSV plus assay is intended as an aid in the diagnosis of influenza from Nasopharyngeal swab specimens and should not be used as a sole basis for treatment. Nasal washings and aspirates are unacceptable for Xpert Xpress SARS-CoV-2/FLU/RSV testing.  Fact Sheet for Patients: EntrepreneurPulse.com.au  Fact Sheet for Healthcare Providers: IncredibleEmployment.be  This test is not yet approved or cleared by the [place] FDA and has been authorized for detection and/or diagnosis of SARS-CoV-2 by FDA under an Emergency Use Authorization (EUA). This EUA will remain in effect (meaning this test can be used) for the duration of the COVID-19 declaration under Section 564(b)(1) of the Act, 21 U.S.C. section 360bbb-3(b)(1), unless the authorization is terminated  or revoked.  Performed at [facility], [place] [address], [place], [place] [address]   MRSA Next Gen by PCR, Nasal     Status: None   Collection Time: 02/04/21  1:50 AM   Specimen: Nasal Mucosa; Nasal Swab  Result Value Ref Range Status   MRSA by PCR Next Gen NOT DETECTED NOT DETECTED Final    Comment: (NOTE) The GeneXpert MRSA Assay (FDA approved for NASAL specimens only), is one component of a comprehensive MRSA colonization surveillance program. It is not intended to diagnose MRSA infection nor to guide or monitor treatment for MRSA infections. Test performance is not FDA approved in patients less than 65 years old. Performed at [facility], [place] [address] [place], [place] [address]         Radiology Studies: No results found.      Scheduled Meds: Continuous Infusions:  morphine 2 mg/hr (02/11/21 0438)     LOS: 8 days     Cordelia Poche, MD [facility] 02/11/2021, 1:44 PM  If 7PM-7AM, please contact night-coverage [email or web address]

## 2021-02-11 NOTE — Progress Notes (Signed)
Manufacturing engineer [facility]) Hospital Liaison note.     Pt has been deemed [facility] eligible by [facility] MD.  [facility] is unable to offer a room today. Hospital Liaison will follow up tomorrow or sooner if a room becomes available. Please do not hesitate to call with questions.    Thank you for the opportunity to participate in this patient's care.  Domenic Moras, BSN, RN [facility] Liaison (listed on [place] under [facility]ospice[facility])    [phone] (24h on call)

## 2021-02-11 NOTE — Progress Notes (Signed)
Daily Progress Note   Patient Name: Isaiah Jackson       Date: 02/11/2021 DOB: 13-Jan-1934  Age: 86 y.o. MRN#: [id number] Attending Physician: Mariel Aloe, MD Primary Care Physician: [name], MD Admit Date: 02/04/2021  Reason for Consultation/Follow-up: end of life care, symptom management  Subjective: Patient appears comfortable. Morphine is at 2 mg/hr. Unresponsive to voice and light touch. No non-verbal signs of pain or discomfort noted. Respirations are even and unlabored. No excessive respiratory secretions noted.  Family currently not present at bedside.    Length of Stay: 8  Current Medications:   Continuous Infusions:  morphine 2 mg/hr (02/11/21 0438)    PRN Meds: acetaminophen **OR** acetaminophen, antiseptic oral rinse, glycopyrrolate **OR** glycopyrrolate **OR** glycopyrrolate, LORazepam **OR** LORazepam **OR** LORazepam, morphine, ondansetron **OR** ondansetron (ZOFRAN) IV, polyvinyl alcohol      Vital Signs: BP (!) 141/66 (BP Location: Right Arm)   Pulse 79   Temp 98 F (36.7 C) (Oral)   Resp 18   Ht 6' (1.829 m)   Wt 85.3 kg   SpO2 91%   BMI 25.50 kg/m  SpO2: SpO2: 91 % O2 Device: O2 Device: Nasal Cannula O2 Flow Rate: O2 Flow Rate (L/min): 2 L/min       Palliative Assessment/Data: PPS 10%     Palliative Care Assessment & Plan   Patient Profile: Per intake H&P --> Past medical history of COPD, HTN, CKD 4, renal cancer, HLD, CAD, CVA on Plavix, DNR on admission.  Presents with complaints of abdominal pain and nausea and vomiting.  Found to have bowel perforation. SP OR with Dr. Grandville Silos for exploratory laparotomy and patch repair. NPO presently. Palliative care has been asked to get involved to further discuss goals of care in the setting of  multiple co-morbidities and acute illness.   Assessment: - recent diagnosis of large left kidney mass, family declined work-up - bowel perforation s/p exploratory laparotomy and patch repair - acute hypoxic respiratory failure post-op - transition to comfort care 8/3   Recommendations/Plan: Full comfort measures  Continue morphine infusion Transfer to [facility] when a bed is available PRN medications are available for symptom management at EOL PMT will continue to follow   Goals of Care and Additional Recommendations: Limitations on Scope of Treatment: Full Comfort Care  Code Status: DNR/DNI  Prognosis:  < 2 weeks  Discharge Planning: Hospice facility    Thank you for allowing the Palliative Medicine Team to assist in the care of this patient.   Total Time 15 minutes Prolonged Time Billed  no       Greater than 50%  of this time was spent counseling and coordinating care related to the above assessment and plan.  Lavena Bullion, NP  Please contact Palliative Medicine Team phone at [phone] for questions and concerns.

## 2021-02-12 MED ORDER — ARTIFICIAL TEARS OPHTHALMIC OINT
1.0000 "application " | TOPICAL_OINTMENT | OPHTHALMIC | Status: DC | PRN
  Administered 2021-02-12: 1 via OPHTHALMIC
  Filled 2021-02-12: qty 3.5

## 2021-02-12 NOTE — Progress Notes (Signed)
Manufacturing engineer [facility]) Hospital Liaison note.     Pt has been deemed [facility] eligible by [facility] MD.  [facility] is unable to offer a room today. Hospital Liaison will follow up tomorrow or sooner if a room becomes available. Please do not hesitate to call with questions.    Thank you for the opportunity to participate in this patient's care.  Domenic Moras, BSN, RN [facility] Liaison (listed on [place] under [facility])    [phone] (24h on call)

## 2021-02-12 NOTE — Progress Notes (Signed)
PROGRESS NOTE    [name]  RKY:[id number] DOB: 07-Feb-1934 DOA: 01/09/2021 PCP: [name], MD   Brief Narrative: Isaiah Jackson is a 87 y.o. male with a history of with history of MI with angioplasty/stent, diabetes mellitus type 2, aortic aneurysm status post stent repair, BPH, hyperlipidemia, COPD, hypertension, CKD stage IV, hyperlipidemia, CVA on Plavix.  Patient presented secondary to abdominal pain, nausea, vomiting and was found to have bowel perforation.  General surgery performed emergent exploratory laparotomy with patch repair.  Post extubation from procedure, patient developed worsening dyspnea and respiratory distress requiring reintubation and ICU admission.  He was briefly intubated for about 1 day before extubation.  During hospitalization, patient continued to have worsening hypoxia with eventual transition to comfort measures.  Patient is now on morphine drip with plans to transfer to hospice facility if bed is available.   Assessment & Plan:   Principal Problem:   Bowel perforation (HCC) Active Problems:   Hypertension   CKD stage 4 due to type 2 diabetes mellitus (HCC)   Status post surgery   Comfort measures only status   Acute respiratory failure with hypoxia (HCC)   Acute respiratory failure with hypoxia Developed postextubation and after surgery, on 7/30.  Patient was reintubated and admitted to the ICU.  Patient was managed on ventilator for about 1 day and was extubated on 7/31.  Patient was initially managed on BiPAP and was weaned to nasal cannula. -Keep on [place] 2L/min or less; manage dyspnea with morphine/ativan  Perforated ulcer Patient underwent exploratory laparotomy with repair on admission on 7/30.  AKI on CKD stage IV Baseline creatinine of about 2.6.  While admitted, patient's creatinine has been labile but is trending up prior to transition to comfort measures.  No treatment.  Renal cell carcinoma Noted  Goals of care Palliative  care has been consulted and patient has now been transferred addition to full comfort measures.  Patient started on morphine drip with plans to transfer to [facility] once a bed is available. -Eye care, Liquifilm ointment if not blinking/closing his eyes  History of CVA Atrial fibrillation with RVR Essential hypertension Hyperlipidemia Type 2 diabetes mellitus    DVT prophylaxis: None Code Status:   Code Status: DNR Family Communication: Daughters at bedside Disposition Plan: Discharge to hospice facility versus in-hospital death   Consultants:  PCCM General surgery Palliative care medicine  Procedures:  EXPLORATORY LAPAROTOMY/REPAIR OF PERFORATED ULCER (01/27/2021)  Antimicrobials: Zosyn IV   Subjective: No issues overnight. Oxygen was increased to 4 L/min > 3 L/min  Objective: Vitals:   02/09/21 0525 02/10/21 0431 02/11/21 0439 02/12/21 0316  BP: (!) 142/67 130/61 (!) 141/66 (!) 117/48  Pulse: 75 78 79 95  Resp: (!) 26 18 18 20   Temp: 97.8 F (36.6 C) (!) 97.5 F (36.4 C) 98 F (36.7 C) 98.5 F (36.9 C)  TempSrc: Oral Oral Oral   SpO2: 98% 96% 91% 90%  Weight:      Height:        Intake/Output Summary (Last 24 hours) at 02/12/2021 1418 Last data filed at 02/12/2021 0900 Gross per 24 hour  Intake 48.63 ml  Output 255 ml  Net -206.37 ml    Filed Weights   01/30/2021 1113 01/07/2021 1443  Weight: 81.6 kg 85.3 kg    Examination:  General: Eyes open, not responsive    Data Reviewed: I have personally reviewed following labs and imaging studies  CBC Lab Results  Component Value Date  WBC 10.7 (H) 02/08/2021   RBC 7.60 (H) 02/08/2021   HGB 18.6 (H) 02/08/2021   HCT 59.1 (H) 02/08/2021   MCV 77.8 (L) 02/08/2021   MCH 24.5 (L) 02/08/2021   PLT 258 02/08/2021   MCHC 31.5 02/08/2021   RDW 21.2 (H) 02/08/2021   LYMPHSABS 0.3 (L) 02/08/2021   MONOABS 1.0 02/08/2021   EOSABS 0.0 02/08/2021   BASOSABS 0.0 67/06/4579     Last metabolic panel Lab  Results  Component Value Date   NA 135 02/08/2021   K 4.7 02/08/2021   CL 103 02/08/2021   CO2 19 (L) 02/08/2021   BUN 72 (H) 02/08/2021   CREATININE 3.01 (H) 02/08/2021   GLUCOSE 142 (H) 02/08/2021   GFRNONAA 20 (L) 02/08/2021   GFRAA 23 (L) 12/06/2020   CALCIUM 9.9 02/08/2021   PHOS 3.7 02/05/2021   PROT 6.1 (L) 02/08/2021   ALBUMIN 2.2 (L) 02/08/2021   BILITOT 1.5 (H) 02/08/2021   ALKPHOS 77 02/08/2021   AST 16 02/08/2021   ALT 8 02/08/2021   ANIONGAP 13 02/08/2021    CBG (last 3)  No results for input(s): GLUCAP in the last 72 hours.   GFR: Estimated Creatinine Clearance: 19.7 mL/min (A) (by C-G formula based on SCr of 3.01 mg/dL (H)).  Coagulation Profile: No results for input(s): INR, PROTIME in the last 168 hours.  Recent Results (from the past 240 hour(s))  Urine Culture     Status: Abnormal   Collection Time: 01/27/2021 11:35 AM   Specimen: Urine, Clean Catch  Result Value Ref Range Status   Specimen Description URINE, CLEAN CATCH  Final   Special Requests NONE  Final   Culture (A)  Final    <10,000 COLONIES/mL INSIGNIFICANT GROWTH Performed at [facility], [address] [place], [place] [address]    Report Status 02/04/2021 FINAL  Final  Resp Panel by RT-PCR (Flu A&B, Covid) Nasopharyngeal Swab     Status: None   Collection Time: 01/14/2021  1:28 PM   Specimen: Nasopharyngeal Swab; Nasopharyngeal(NP) swabs in vial transport medium  Result Value Ref Range Status   SARS Coronavirus 2 by RT PCR NEGATIVE NEGATIVE Final    Comment: (NOTE) SARS-CoV-2 target nucleic acids are NOT DETECTED.  The SARS-CoV-2 RNA is generally detectable in upper respiratory specimens during the acute phase of infection. The lowest concentration of SARS-CoV-2 viral copies this assay can detect is 138 copies/mL. A negative result does not preclude SARS-Cov-2 infection and should not be used as the sole basis for treatment or other patient management decisions. A negative  result may occur with  improper specimen collection/handling, submission of specimen other than nasopharyngeal swab, presence of viral mutation(s) within the areas targeted by this assay, and inadequate number of viral copies(<138 copies/mL). A negative result must be combined with clinical observations, patient history, and epidemiological information. The expected result is Negative.  Fact Sheet for Patients:  EntrepreneurPulse.com.au  Fact Sheet for Healthcare Providers:  IncredibleEmployment.be  This test is no t yet approved or cleared by the [place] FDA and  has been authorized for detection and/or diagnosis of SARS-CoV-2 by FDA under an Emergency Use Authorization (EUA). This EUA will remain  in effect (meaning this test can be used) for the duration of the COVID-19 declaration under Section 564(b)(1) of the Act, 21 U.S.C.section 360bbb-3(b)(1), unless the authorization is terminated  or revoked sooner.       Influenza A by PCR NEGATIVE NEGATIVE Final   Influenza B by  PCR NEGATIVE NEGATIVE Final    Comment: (NOTE) The Xpert Xpress SARS-CoV-2/FLU/RSV plus assay is intended as an aid in the diagnosis of influenza from Nasopharyngeal swab specimens and should not be used as a sole basis for treatment. Nasal washings and aspirates are unacceptable for Xpert Xpress SARS-CoV-2/FLU/RSV testing.  Fact Sheet for Patients: EntrepreneurPulse.com.au  Fact Sheet for Healthcare Providers: IncredibleEmployment.be  This test is not yet approved or cleared by the [place] FDA and has been authorized for detection and/or diagnosis of SARS-CoV-2 by FDA under an Emergency Use Authorization (EUA). This EUA will remain in effect (meaning this test can be used) for the duration of the COVID-19 declaration under Section 564(b)(1) of the Act, 21 U.S.C. section 360bbb-3(b)(1), unless the authorization is  terminated or revoked.  Performed at [facility], [place] [address], [place], [place] [address]   MRSA Next Gen by PCR, Nasal     Status: None   Collection Time: 02/04/21  1:50 AM   Specimen: Nasal Mucosa; Nasal Swab  Result Value Ref Range Status   MRSA by PCR Next Gen NOT DETECTED NOT DETECTED Final    Comment: (NOTE) The GeneXpert MRSA Assay (FDA approved for NASAL specimens only), is one component of a comprehensive MRSA colonization surveillance program. It is not intended to diagnose MRSA infection nor to guide or monitor treatment for MRSA infections. Test performance is not FDA approved in patients less than 46 years old. Performed at [facility], [place] [address] [place], [place] [address]         Radiology Studies: No results found.      Scheduled Meds: Continuous Infusions:  morphine 2 mg/hr (02/12/21 0459)     LOS: 9 days     Cordelia Poche, MD [facility] 02/12/2021, 2:18 PM  If 7PM-7AM, please contact night-coverage [email or web address]

## 2021-02-12 NOTE — Plan of Care (Signed)
  Problem: Education: Goal: Knowledge of General Education information will improve Description: Including pain rating scale, medication(s)/side effects and non-pharmacologic comfort measures Outcome: Progressing   Problem: Health Behavior/Discharge Planning: Goal: Ability to manage health-related needs will improve Outcome: Progressing   Problem: Clinical Measurements: Goal: Ability to maintain clinical measurements within normal limits will improve Outcome: Progressing Goal: Respiratory complications will improve Outcome: Progressing   Problem: Activity: Goal: Risk for activity intolerance will decrease Outcome: Progressing   Problem: Nutrition: Goal: Adequate nutrition will be maintained Outcome: Progressing   Problem: Elimination: Goal: Will not experience complications related to urinary retention Outcome: Progressing   Problem: Pain Managment: Goal: General experience of comfort will improve Outcome: Progressing   Problem: Safety: Goal: Ability to remain free from injury will improve Outcome: Progressing

## 2021-02-12 NOTE — Progress Notes (Signed)
Patient full comfort measures awaiting discharge to hospice. Will leave drain for comfort given bilious output. No further surgical needs - please reach out as needed

## 2021-03-08 NOTE — Progress Notes (Signed)
    OVERNIGHT PROGRESS REPORT  Notified by RN that patient has expired at 0252  Patient was DNR/palliative   2 RN verified.  Family was not immediately available to RN but they have been contacted and are en-route to the facility.   Gershon Cull MSNA ACNPC-AG Acute Care Nurse Practitioner [place]

## 2021-03-08 NOTE — Death Summary Note (Signed)
DEATH SUMMARY   Patient Details  Name: Isaiah Jackson MRN: [id number] DOB: December 20, 1933  Admission/Discharge Information   Admit Date:  02-22-21  Date of Death: Date of Death: 03-04-2021  Time of Death: Time of Death: 0252  Length of Stay: 10-04-22  Referring Physician: [name], MD   Reason(s) for Hospitalization   Abdominal pain. Bowel perforation  Diagnoses  Preliminary cause of death:  Secondary Diagnoses (including complications and co-morbidities):  Principal Problem:   Bowel perforation (HCC) Active Problems:   Hypertension   CKD stage 4 due to type 2 diabetes mellitus ([place])   Status post surgery   Comfort measures only status   Acute respiratory failure with hypoxia [facility])   Brief Hospital Course (including significant findings, care, treatment, and services provided and events leading to death)  Isaiah Jackson is a 87 y.o. year old male with a history of with history of MI with angioplasty/stent, diabetes mellitus type 2, aortic aneurysm status post stent repair, BPH, hyperlipidemia, COPD, hypertension, CKD stage IV, hyperlipidemia, CVA on Plavix.  Patient presented secondary to abdominal pain, nausea, vomiting and was found to have bowel perforation.  General surgery performed emergent exploratory laparotomy with patch repair.  Post extubation from procedure, patient developed worsening dyspnea and respiratory distress requiring reintubation and ICU admission.  He was briefly intubated for about 1 day before extubation.  During hospitalization, patient continued to have worsening hypoxia with eventual transition to comfort measures.  Patient is now on morphine drip with plans to transfer to hospice facility if bed is available.   Acute respiratory failure with hypoxia Developed postextubation and after surgery, on 02-23-2023.  Patient was reintubated and admitted to the ICU.  Patient was managed on ventilator for about 1 day and was extubated on 7/31.  Patient was initially  managed on BiPAP and was weaned to nasal cannula.   Perforated ulcer Patient underwent exploratory laparotomy with repair on admission on February 23, 2023.   AKI on CKD stage IV Baseline creatinine of about 2.6.  While admitted, patient's creatinine has been labile but is trending up prior to transition to comfort measures.  No treatment.   Renal cell carcinoma Noted   Goals of care Palliative care has been consulted and patient has now been transferred addition to full comfort measures.  Patient started on morphine drip with plans to transfer to [facility] once a bed is available. Eye care, Liquifilm ointment if not blinking/closing his eyes.   History of CVA Atrial fibrillation with RVR Essential hypertension Hyperlipidemia Type 2 diabetes mellitus   Pertinent Labs and Studies  Significant Diagnostic Studies CT Abdomen Pelvis Wo Contrast  Result Date: 2021/02/22 CLINICAL DATA:  Abdominal pain and weakness. Previous abdominal aortic aneurysm repair. EXAM: CT ABDOMEN AND PELVIS WITHOUT CONTRAST TECHNIQUE: Multidetector CT imaging of the abdomen and pelvis was performed following the standard protocol without IV contrast. COMPARISON:  01/15/2021. FINDINGS: Lower chest: Stable enlarged heart and atheromatous coronary artery calcifications. Minimal dependent atelectasis at both lung bases. Hepatobiliary: Normal appearing liver. Gallstones in the gallbladder, the largest measuring 1.7 cm in diameter. No gallbladder wall thickening or pericholecystic fluid. Pancreas: Mild to moderate diffuse pancreatic atrophy. Spleen: Small calcified granuloma.  Normal in size and shape. Adrenals/Urinary Tract: Normal appearing adrenal glands. Again demonstrated is moderate dilatation of the right renal collecting system to the level of the ureteropelvic junction. A heterogeneous lower pole mass is again demonstrated measuring 7.2 x 6.4 cm on image number 42/3, previously 6.8 x 6.5 cm. This is  involving the UPJ. Normal  appearing right kidney, ureters and urinary bladder. Stomach/Bowel: Multiple sigmoid colon diverticula without evidence of diverticulitis. Normal appearing appendix. Suggestion of mild poorly defined wall thickening in the region of the distal gastric antrum and proximal duodenum slight adjacent soft tissue stranding. Vascular/Lymphatic: Stable aorto bi-iliac stent. Proximal aortic atheromatous calcifications. No enlarged lymph nodes. Reproductive: Prostate is unremarkable.  Bilateral hydroceles. Other: Small bilateral inguinal hernias containing fat. Small to moderate amount of free peritoneal air and small amount of free peritoneal fluid. Musculoskeletal: Lumbar and lower thoracic spine degenerative changes. IMPRESSION: 1. Free peritoneal air and free peritoneal fluid compatible with bowel perforation. 2. Suggestion of mild poorly defined wall thickening and adjacent soft tissue stranding in the region of the distal gastric antrum and proximal duodenum, possibly indicating a ruptured gastric or duodenal ulcer as the source of perforation. 3. Again demonstrated large lower pole left renal mass most likely representing a primary renal cell carcinoma and causing moderate hydronephrosis due to obstruction at the UPJ. 4. Sigmoid diverticulosis. 5.  Calcific coronary artery and aortic atherosclerosis. 6. Bilateral hydroceles and small inguinal hernias containing fat. 7. Cholelithiasis without evidence cholecystitis. Electronically Signed   By: Claudie Revering M.D.   On: 01/17/2021 12:47   CT Head Wo Contrast  Result Date: 01/15/2021 CLINICAL DATA:  87 year old male with concern for intracranial hemorrhage. EXAM: CT HEAD WITHOUT CONTRAST TECHNIQUE: Contiguous axial images were obtained from the base of the skull through the vertex without intravenous contrast. COMPARISON:  Brain MRI dated 10/14/2020 FINDINGS: Brain: Mild age-related atrophy and chronic microvascular ischemic changes. Probable punctate old lacunar  infarct in the left corona radiata. There is no acute intracranial hemorrhage. No mass effect or midline shift. No extra-axial fluid collection. Vascular: No hyperdense vessel or unexpected calcification. Skull: Normal. Negative for fracture or focal lesion. Sinuses/Orbits: Partial opacification of the left maxillary sinus. No air-fluid level. Right mastoid effusion. Other: None IMPRESSION: 1. No acute intracranial pathology. 2. Mild age-related atrophy and chronic microvascular ischemic changes. Electronically Signed   By: Anner Crete M.D.   On: 01/15/2021 02:22   DG CHEST PORT 1 VIEW  Result Date: 02/07/2021 CLINICAL DATA:  Follow-up pneumonia EXAM: PORTABLE CHEST 1 VIEW COMPARISON:  02/06/2021 FINDINGS: Heart is mildly enlarged. Vascular congestion. Bilateral lower lobe airspace opacities and layering effusions are similar prior study. No acute bony abnormality. IMPRESSION: Layering effusions with bilateral lower lobe airspace disease, similar to prior study. Cardiomegaly, vascular congestion. Electronically Signed   By: Rolm Baptise M.D.   On: 02/07/2021 08:50   DG CHEST PORT 1 VIEW  Result Date: 02/06/2021 CLINICAL DATA:  Shortness of breath and weakness EXAM: PORTABLE CHEST 1 VIEW COMPARISON:  01/10/2021 FINDINGS: Midline trachea. Moderate cardiomegaly. Extubation and removal of nasogastric tube. Small bilateral pleural effusions. No pneumothorax. Biapical pleural thickening. Mild interstitial edema is increased. Right greater than left base airspace disease. IMPRESSION: Mild congestive heart failure, increased. Small bilateral pleural effusions with bibasilar Airspace disease, likely atelectasis. Electronically Signed   By: Abigail Miyamoto M.D.   On: 02/06/2021 15:02   DG CHEST PORT 1 VIEW  Result Date: 01/05/2021 CLINICAL DATA:  Intubation. EXAM: PORTABLE CHEST 1 VIEW COMPARISON:  Radiograph earlier today. FINDINGS: Endotracheal tube tip 5.6 cm from the carina. Enteric tube in place with tip and  side-port not included in the field of view below the diaphragm. Cardiomegaly is unchanged. Unchanged mediastinal contours. Vascular congestion. Small pleural effusions. No pneumothorax. IMPRESSION: 1. Endotracheal tube tip 5.6 cm from the  carina. Enteric tube in place with tip and side-port below the diaphragm, not included in the field of view. 2. Cardiomegaly with vascular congestion and small pleural effusions. Electronically Signed   By: Keith Rake M.D.   On: 01/15/2021 23:08   DG Chest Portable 1 View  Result Date: 01/24/2021 CLINICAL DATA:  Hypoxia.  Abdominal pain. EXAM: PORTABLE CHEST 1 VIEW COMPARISON:  01/15/2020 FINDINGS: Stable enlarged cardiac silhouette and tortuous aorta. Clear lungs. Lower thoracic spine degenerative changes. IMPRESSION: No acute abnormality.  Stable cardiomegaly. Electronically Signed   By: Claudie Revering M.D.   On: 01/24/2021 12:55   DG Chest Portable 1 View  Result Date: 01/14/2021 CLINICAL DATA:  87 year old male with shortness of breath. EXAM: PORTABLE CHEST 1 VIEW COMPARISON:  Chest radiograph dated 2212. FINDINGS: Bibasilar atelectasis/scarring. No focal consolidation, pleural effusion, or pneumothorax. Mild cardiomegaly. Atherosclerotic calcification of the aorta. No acute osseous pathology. IMPRESSION: No active disease. Electronically Signed   By: Anner Crete M.D.   On: 01/14/2021 23:59   ECHOCARDIOGRAM COMPLETE  Result Date: 02/07/2021    ECHOCARDIOGRAM REPORT   Patient Name:   Isaiah Jackson Date of Exam: 02/07/2021 Medical Rec #:  [id number]       Height:       72.0 in Accession #:    [id number]      Weight:       188.0 lb Date of Birth:  April 25, 1934       BSA:          2.075 m Patient Age:    33 years        BP:           159/59 mmHg Patient Gender: M               HR:           70 bpm. Exam Location:  Inpatient Procedure: 2D Echo, Cardiac Doppler, Color Doppler and Intracardiac            Opacification Agent Indications:    I48.91* Unspeicified atrial  fibrillation  History:        Patient has no prior history of Echocardiogram examinations. CAD                 and Previous Myocardial Infarction, COPD and Stroke; Risk                 Factors:Diabetes, Dyslipidemia and Hypertension.  Sonographer:    Bernadene Person RDCS Referring Phys: [id number] [place]  1. Apical septal akinesis with thrombus (3.3 x 1 cm) - see image 115. Left ventricular ejection fraction, by estimation, is 45 to 50%. The left ventricle has mildly decreased function. The left ventricle demonstrates regional wall motion abnormalities (see scoring diagram/findings for description). Left ventricular diastolic parameters are consistent with Grade I diastolic dysfunction (impaired relaxation).  2. Right ventricular systolic function is normal. The right ventricular size is normal.  3. Left atrial size was mildly dilated.  4. The mitral valve is normal in structure. No evidence of mitral valve regurgitation. No evidence of mitral stenosis.  5. The aortic valve is normal in structure. Aortic valve regurgitation is not visualized. No aortic stenosis is present.  6. The inferior vena cava is normal in size with greater than 50% respiratory variability, suggesting right atrial pressure of 3 mmHg. FINDINGS  Left Ventricle: Apical septal akinesis with thrombus (3.3 x 1 cm) - see image 115. Left ventricular ejection fraction, by estimation, is 45  to 50%. The left ventricle has mildly decreased function. The left ventricle demonstrates regional wall motion abnormalities. Definity contrast agent was given IV to delineate the left ventricular endocardial borders. The left ventricular internal cavity size was normal in size. There is no left ventricular hypertrophy. Left ventricular diastolic parameters are consistent with Grade I diastolic dysfunction (impaired relaxation).  LV Wall Scoring: The mid and distal anterior septum and mid inferoseptal segment are akinetic. Right Ventricle: The right  ventricular size is normal. No increase in right ventricular wall thickness. Right ventricular systolic function is normal. Left Atrium: Left atrial size was mildly dilated. Right Atrium: Right atrial size was normal in size. Pericardium: There is no evidence of pericardial effusion. Mitral Valve: The mitral valve is normal in structure. No evidence of mitral valve regurgitation. No evidence of mitral valve stenosis. Tricuspid Valve: The tricuspid valve is normal in structure. Tricuspid valve regurgitation is not demonstrated. No evidence of tricuspid stenosis. Aortic Valve: The aortic valve is normal in structure. Aortic valve regurgitation is not visualized. No aortic stenosis is present. Pulmonic Valve: The pulmonic valve was normal in structure. Pulmonic valve regurgitation is not visualized. No evidence of pulmonic stenosis. Aorta: The aortic root is normal in size and structure. Venous: The inferior vena cava is normal in size with greater than 50% respiratory variability, suggesting right atrial pressure of 3 mmHg. IAS/Shunts: No atrial level shunt detected by color flow Doppler.  LEFT VENTRICLE PLAX 2D LVIDd:         5.60 cm  Diastology LVIDs:         4.20 cm  LV e' medial:    7.29 cm/s LV PW:         1.00 cm  LV E/e' medial:  6.2 LV IVS:        1.10 cm  LV e' lateral:   4.68 cm/s LVOT diam:     2.20 cm  LV E/e' lateral: 9.7 LV SV:         87 LV SV Index:   42 LVOT Area:     3.80 cm  RIGHT VENTRICLE RV S prime:     16.40 cm/s TAPSE (M-mode): 1.8 cm LEFT ATRIUM             Index       RIGHT ATRIUM           Index LA diam:        3.90 cm 1.88 cm/m  RA Area:     16.10 cm LA Vol (A2C):   43.9 ml 21.15 ml/m RA Volume:   36.70 ml  17.68 ml/m LA Vol (A4C):   45.4 ml 21.88 ml/m LA Biplane Vol: 48.9 ml 23.56 ml/m  AORTIC VALVE LVOT Vmax:   119.00 cm/s LVOT Vmean:  75.500 cm/s LVOT VTI:    0.230 m  AORTA Ao Root diam: 3.40 cm Ao Asc diam:  3.20 cm MITRAL VALVE MV Area (PHT): 1.42 cm    SHUNTS MV Decel Time:  535 msec    Systemic VTI:  0.23 m MV E velocity: 45.40 cm/s  Systemic Diam: 2.20 cm MV A velocity: 95.50 cm/s MV E/A ratio:  0.48 Candee Furbish MD Electronically signed by Candee Furbish MD Signature Date/Time: 02/07/2021/3:37:38 PM    Final    CT RENAL STONE STUDY  Result Date: 01/15/2021 CLINICAL DATA:  87 year old male with abdominal pain. EXAM: CT ABDOMEN AND PELVIS WITHOUT CONTRAST TECHNIQUE: Multidetector CT imaging of the abdomen and pelvis was performed following the standard  protocol without IV contrast. COMPARISON:  CT abdomen pelvis dated 10/18/2009. FINDINGS: Evaluation of this exam is limited in the absence of intravenous contrast. Lower chest: Minimal bibasilar atelectasis/scarring. The visualized lung bases are otherwise clear. Mild cardiomegaly. There is coronary vascular calcification. No intra-abdominal free air or free fluid. Hepatobiliary: The liver is unremarkable. No intrahepatic biliary ductal dilatation. Multiple gallstones. No pericholecystic fluid or evidence of acute cholecystitis by CT Pancreas: Unremarkable. No pancreatic ductal dilatation or surrounding inflammatory changes. Spleen: Normal in size without focal abnormality. Adrenals/Urinary Tract: The adrenal glands are unremarkable. There is a 6.5 x 7.0 x 8.0 cm mass involving the mid to lower pole of the left kidney most consistent with malignancy. Further evaluation with MRI without and with contrast on a nonemergent basis recommended. There is nodularity of the left perinephric fat within the Gerota's fascia. There is associated mass effect and compression/infiltration of the ureteropelvic junction with mild left hydronephrosis. There is mild right renal parenchyma atrophy. There is no hydronephrosis or nephrolithiasis on the right. The right ureter and urinary bladder appear unremarkable. Stomach/Bowel: There is sigmoid diverticulosis without active inflammatory changes. There is no bowel obstruction or active inflammation. The  appendix is normal. Vascular/Lymphatic: An aorto bi iliac endovascular stent graft repair noted. Evaluation of the vasculature is limited in the absence of intravenous contrast. The IVC is unremarkable. No portal venous gas. There is no adenopathy. Reproductive: The prostate and seminal vesicles are grossly unremarkable. Other: None Musculoskeletal: Osteopenia with degenerative changes of the spine. No acute osseous pathology. IMPRESSION: 1. Left renal mid to lower pole mass most consistent with malignancy. Further evaluation with MRI without and with contrast on a nonemergent basis recommended. 2. Cholelithiasis. 3. Sigmoid diverticulosis. No bowel obstruction. Normal appendix. 4. Aortic Atherosclerosis (ICD10-I70.0). Electronically Signed   By: Anner Crete M.D.   On: 01/15/2021 02:28    Microbiology Recent Results (from the past 240 hour(s))  MRSA Next Gen by PCR, Nasal     Status: None   Collection Time: 02/04/21  1:50 AM   Specimen: Nasal Mucosa; Nasal Swab  Result Value Ref Range Status   MRSA by PCR Next Gen NOT DETECTED NOT DETECTED Final    Comment: (NOTE) The GeneXpert MRSA Assay (FDA approved for NASAL specimens only), is one component of a comprehensive MRSA colonization surveillance program. It is not intended to diagnose MRSA infection nor to guide or monitor treatment for MRSA infections. Test performance is not FDA approved in patients less than 69 years old. Performed at [facility], [place] [address], [place], [place] [address]     Lab Basic Metabolic Panel: Recent Labs  Lab 02/07/21 0228 02/08/21 0412  NA 135 135  K 5.6* 4.7  CL 100 103  CO2 18* 19*  GLUCOSE 159* 142*  BUN 59* 72*  CREATININE 2.60* 3.01*  CALCIUM 10.3 9.9  MG 2.3 2.3   Liver Function Tests: Recent Labs  Lab 02/07/21 0228 02/08/21 0412  AST 23 16  ALT 12 8  ALKPHOS 64 77  BILITOT 1.6* 1.5*  PROT 6.3* 6.1*  ALBUMIN 2.4* 2.2*   No results for input(s): LIPASE, AMYLASE in the  last 168 hours. No results for input(s): AMMONIA in the last 168 hours. CBC: Recent Labs  Lab 02/07/21 0228 02/08/21 0412  WBC 11.4* 10.7*  NEUTROABS 10.9* 9.3*  HGB 18.5* 18.6*  HCT 60.4* 59.1*  MCV 80.0 77.8*  PLT 268 258   Cardiac Enzymes: No results for input(s): CKTOTAL, CKMB, CKMBINDEX, TROPONINI  in the last 168 hours. Sepsis Labs: Recent Labs  Lab 02/07/21 0228 02/08/21 0412  PROCALCITON 5.60  --   WBC 11.4* 10.7*    Procedures/Operations   EXPLORATORY LAPAROTOMY/REPAIR OF PERFORATED ULCER (01/31/2021)   Cordelia Poche, MD 03/08/2021, 2:23 PM

## 2021-03-08 DEATH — deceased

## 2021-04-09 ENCOUNTER — Other Ambulatory Visit: Payer: Self-pay | Admitting: Internal Medicine

## 2021-04-10 ENCOUNTER — Ambulatory Visit: Payer: Medicare Other | Admitting: Endocrinology

## 2021-05-07 ENCOUNTER — Ambulatory Visit: Payer: Medicare Other | Admitting: Adult Health

## 2021-10-09 ENCOUNTER — Encounter: Payer: Medicare Other | Admitting: Adult Health

## 2022-04-15 IMAGING — DX DG CHEST 1V PORT
1 series · 2 of 2 positions shown · non-contrast
Comparison: Chest radiograph dated 6636.

CLINICAL DATA: 87-year-old male with shortness of breath.

EXAM:
PORTABLE CHEST 1 VIEW

[Series 1: chest ap · 0.14mm/px · 2 of 2 slices shown]
[im 1/2]
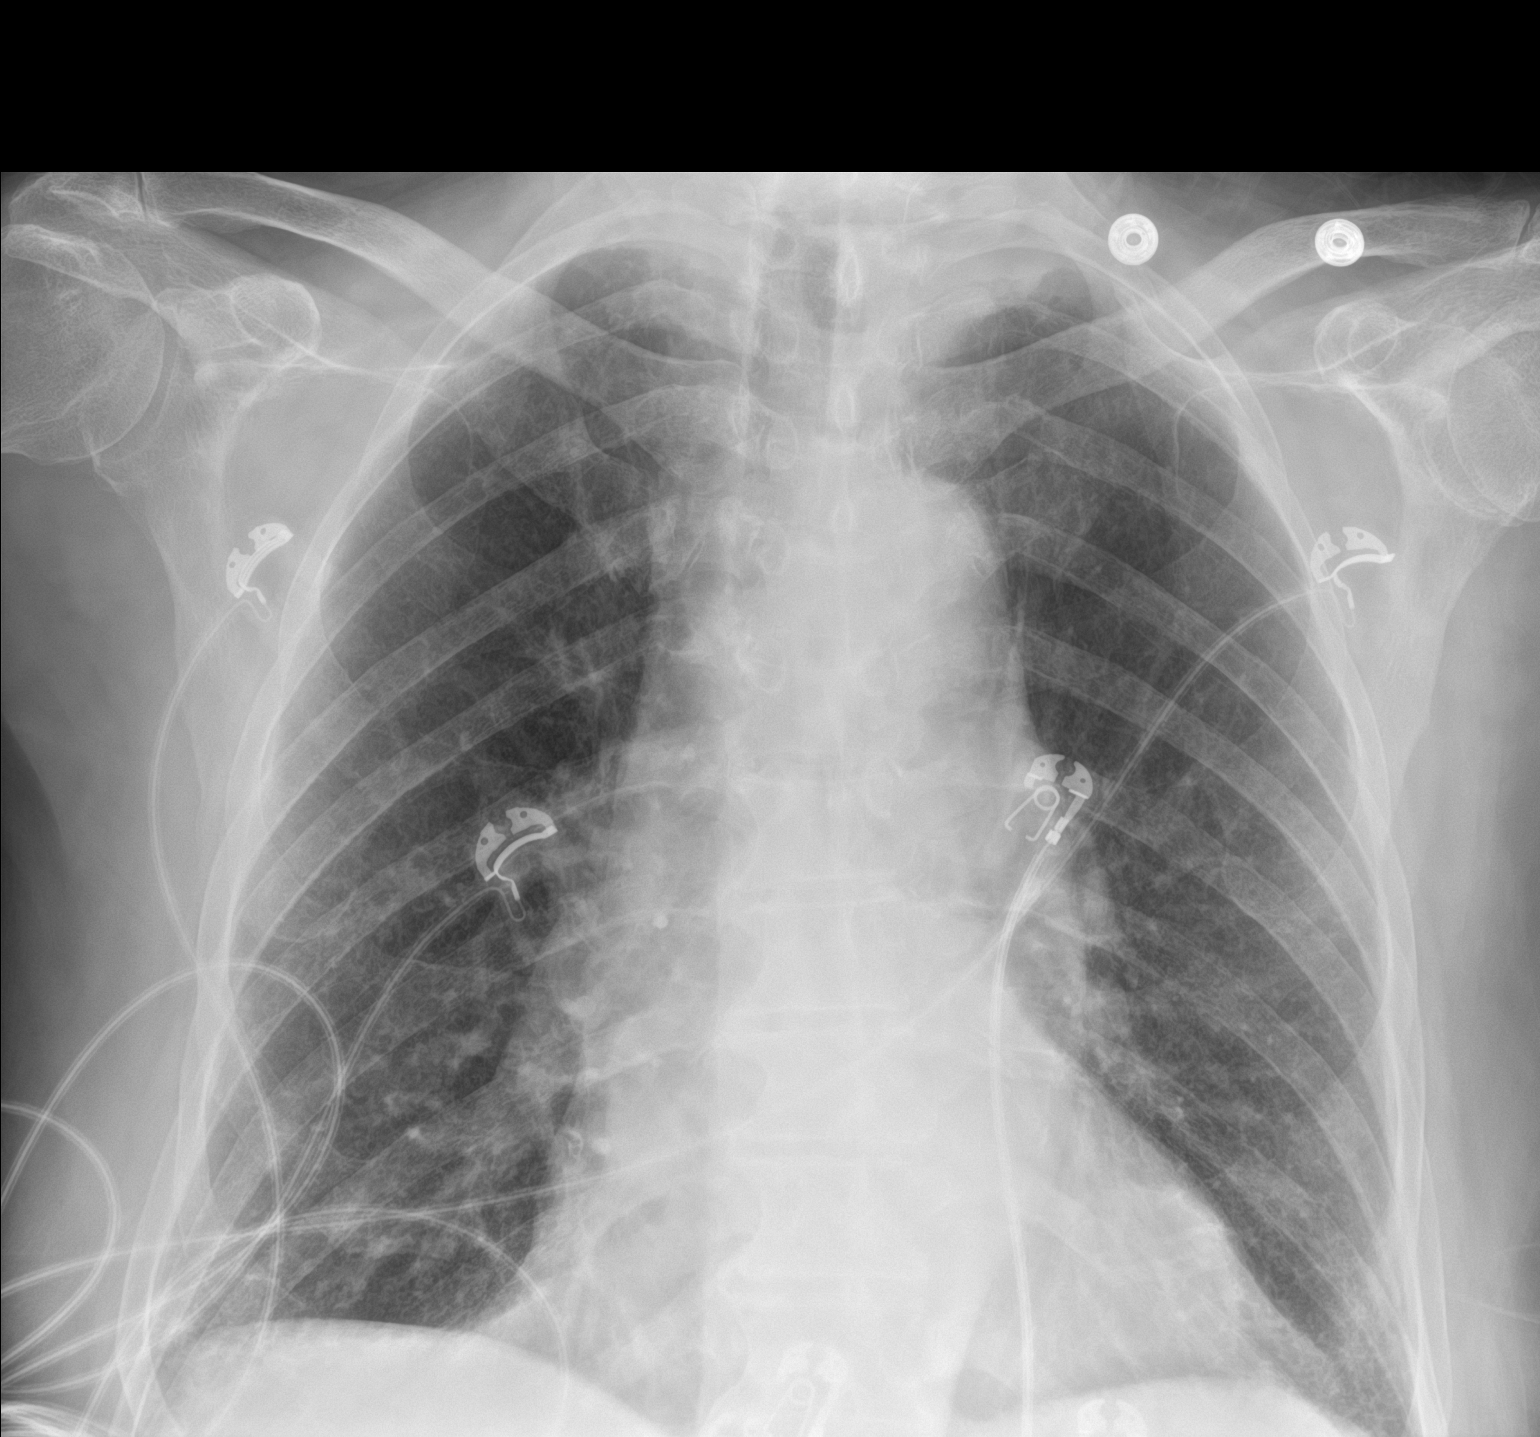
[im 2/2]
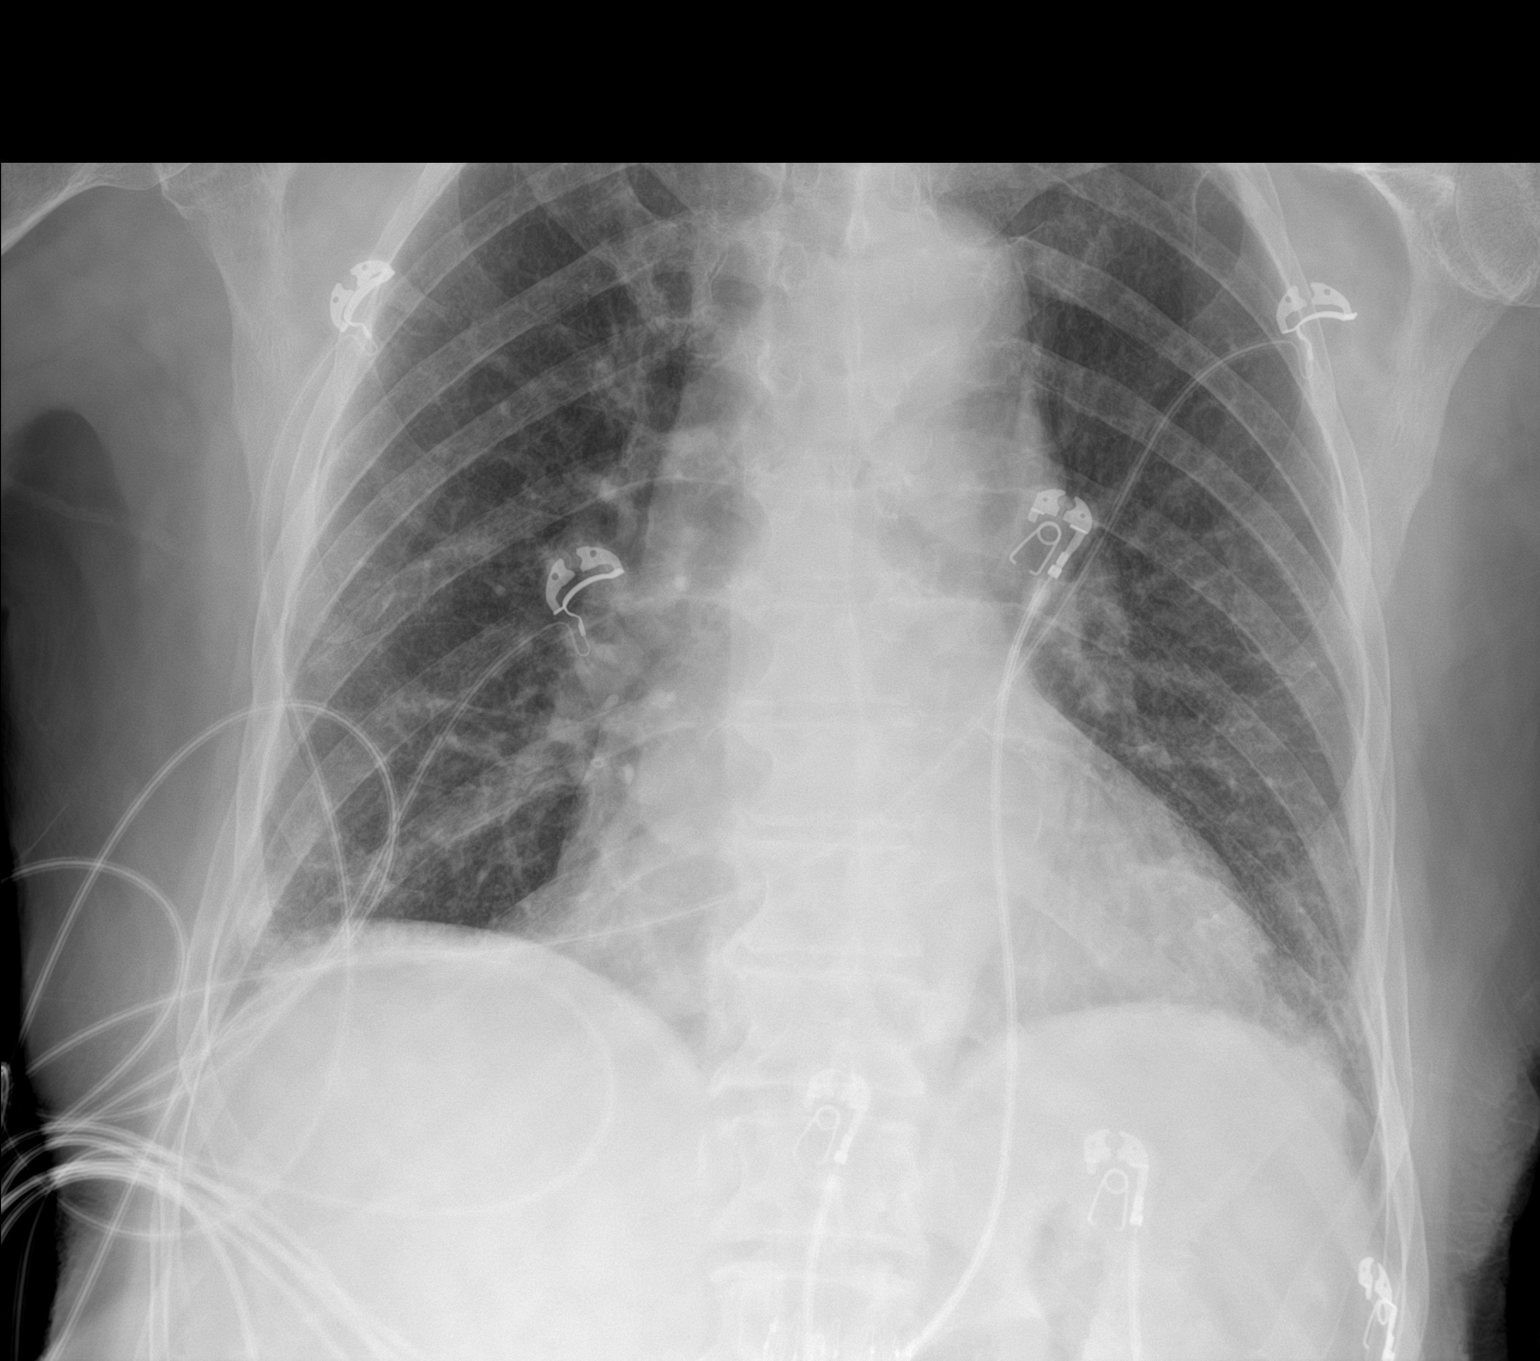

[2 of 2 positions shown; findings below may reference images not displayed]

FINDINGS: Bibasilar atelectasis/scarring. No focal consolidation, pleural
effusion, or pneumothorax. Mild cardiomegaly. Atherosclerotic
calcification of the aorta. No acute osseous pathology.
IMPRESSION: No active disease.

## 2022-04-16 IMAGING — CT CT RENAL STONE PROTOCOL
2 of 4 series · 15 of 46 positions shown, 17 images · non-contrast
Comparison: CT abdomen pelvis dated 10/18/2009.

CLINICAL DATA: 87-year-old male with abdominal pain.

EXAM:
CT ABDOMEN AND PELVIS WITHOUT CONTRAST
TECHNIQUE: Multidetector CT imaging of the abdomen and pelvis was performed
following the standard protocol without IV contrast.

[Series 3: ap without · axial · non-contrast · 0.83mm/px · z∈[-546,-132]mm · 12 of 95 slices shown, 14 images]
[im 6/95  soft-tissue]
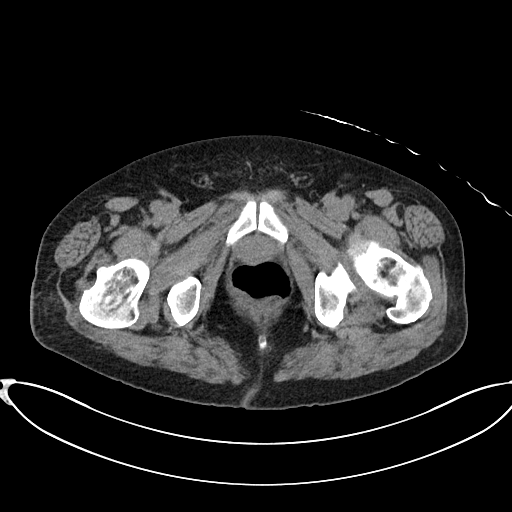
[im 6/95  bone]
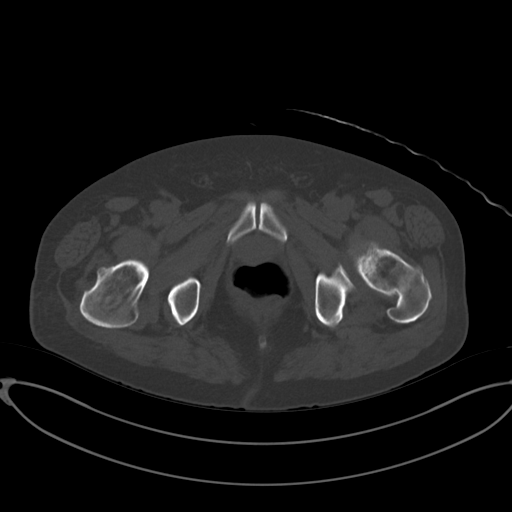
[im 16/95  soft-tissue]
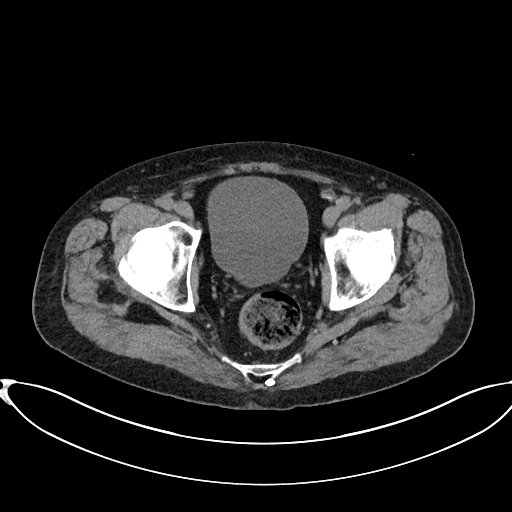
[im 21/95  soft-tissue]
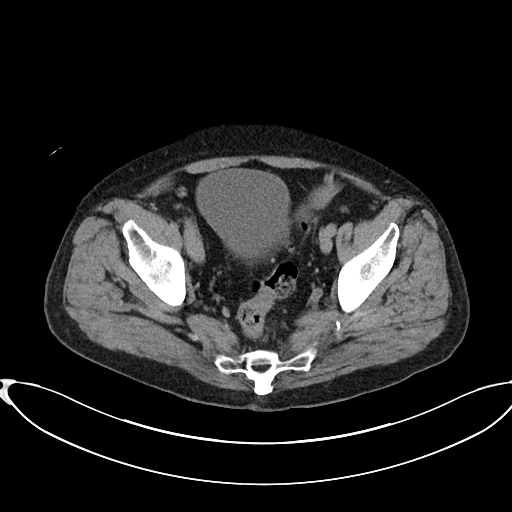
[im 27/95  soft-tissue]
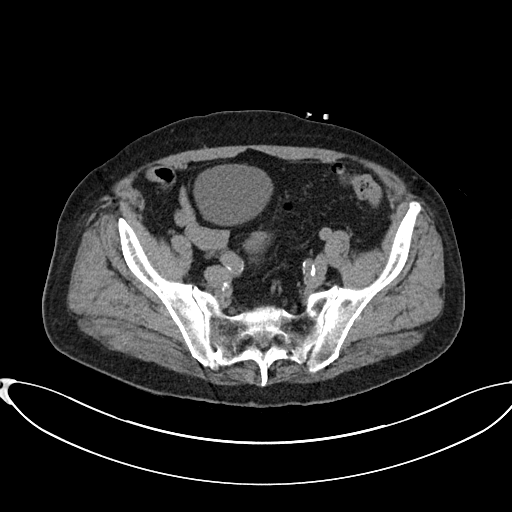
[im 37/95  soft-tissue]
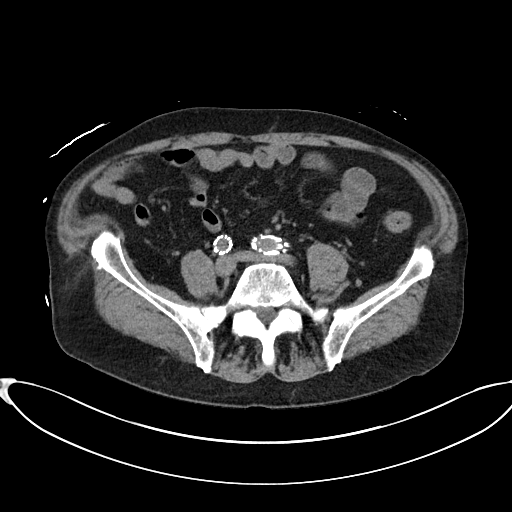
[im 42/95  soft-tissue]
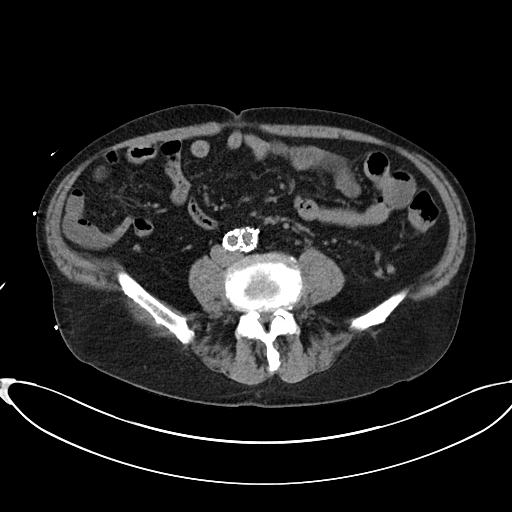
[im 53/95  soft-tissue]
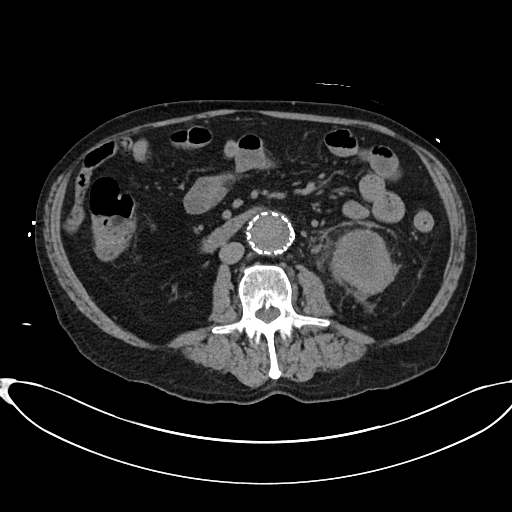
[im 58/95  soft-tissue]
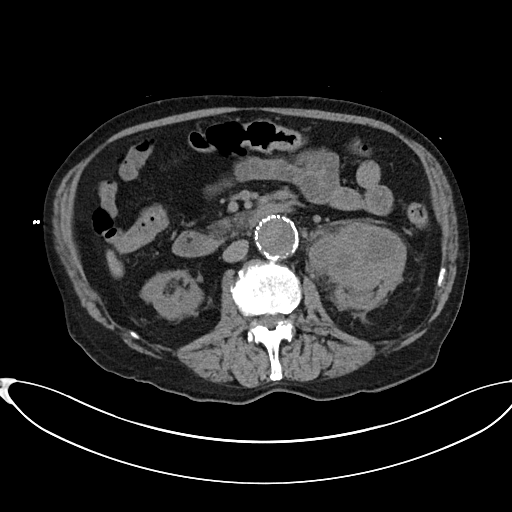
[im 68/95  soft-tissue]
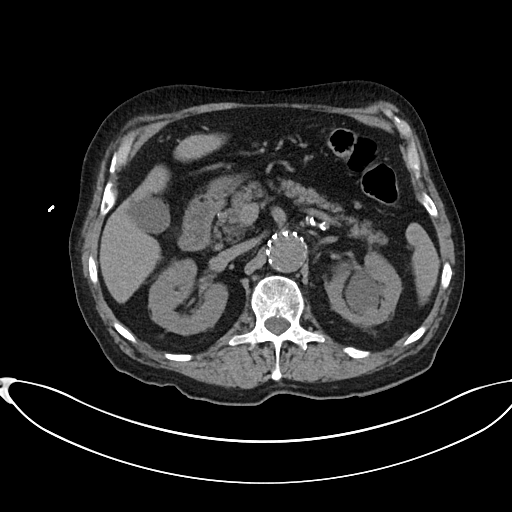
[im 68/95  bone]
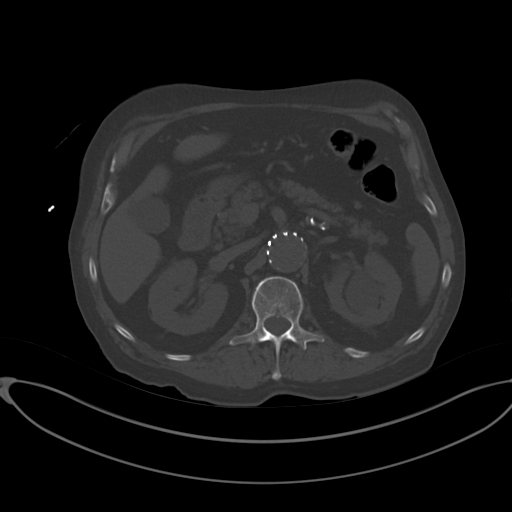
[im 74/95  soft-tissue]
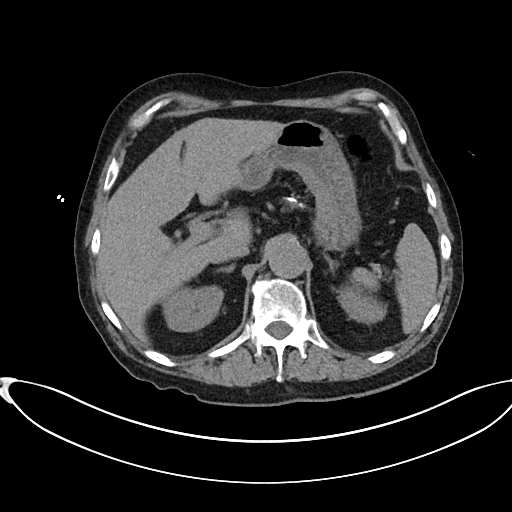
[im 79/95  soft-tissue]
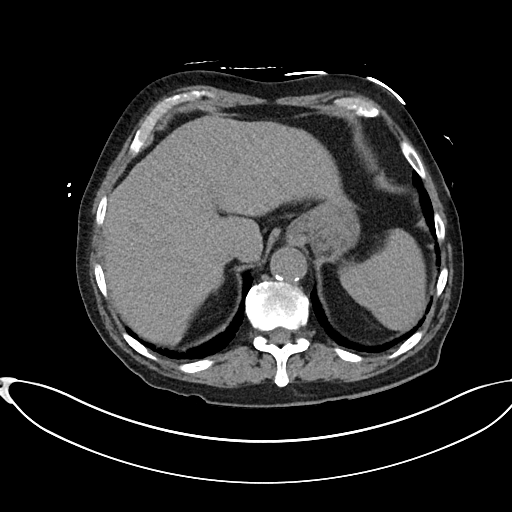
[im 89/95  soft-tissue]
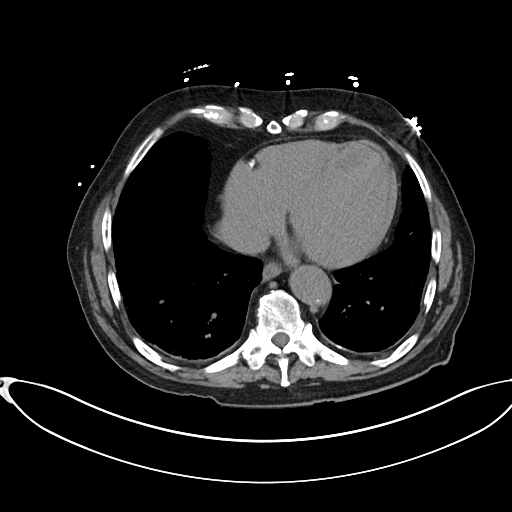

[Series 6: cor · coronal · 0.76mm/px · 3 of 97 slices shown]
[im 43/97  soft-tissue]
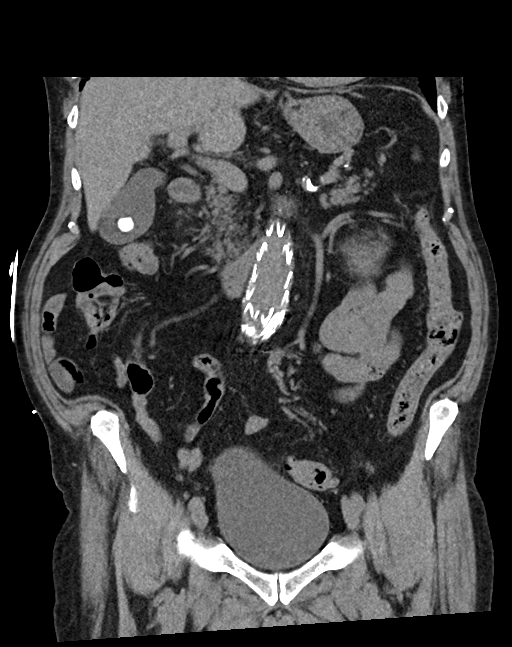
[im 54/97  soft-tissue]
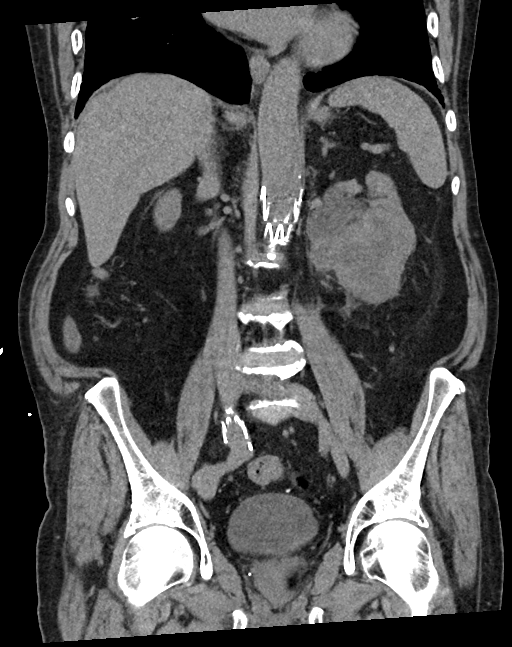
[im 65/97  soft-tissue]
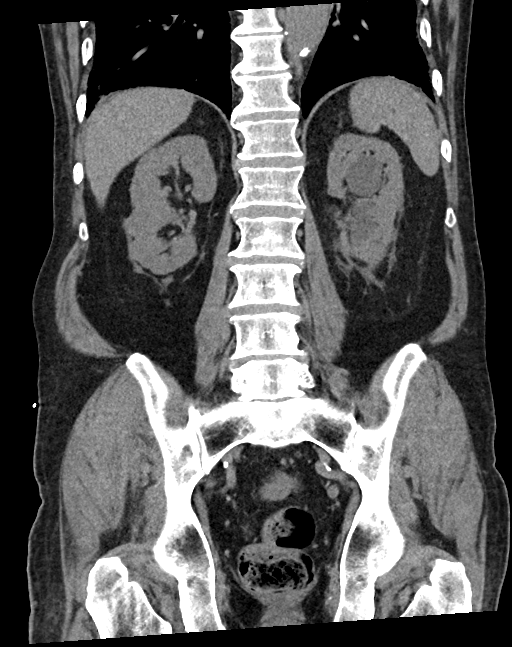

[15 of 46 positions shown; findings below may reference images not displayed]

FINDINGS: Evaluation of this exam is limited in the absence of intravenous
contrast.

Lower chest: Minimal bibasilar atelectasis/scarring. The visualized
lung bases are otherwise clear. Mild cardiomegaly. There is coronary
vascular calcification.

No intra-abdominal free air or free fluid.

Hepatobiliary: The liver is unremarkable. No intrahepatic biliary
ductal dilatation. Multiple gallstones. No pericholecystic fluid or
evidence of acute cholecystitis by CT

Pancreas: Unremarkable. No pancreatic ductal dilatation or
surrounding inflammatory changes.

Spleen: Normal in size without focal abnormality.

Adrenals/Urinary Tract: The adrenal glands are unremarkable. There
is a 6.5 x 7.0 x 8.0 cm mass involving the mid to lower pole of the
left kidney most consistent with malignancy. Further evaluation with
MRI without and with contrast on a nonemergent basis recommended.
There is nodularity of the left perinephric fat within the Gerota's
fascia. There is associated mass effect and compression/infiltration
of the ureteropelvic junction with mild left hydronephrosis.

There is mild right renal parenchyma atrophy. There is no
hydronephrosis or nephrolithiasis on the right. The right ureter and
urinary bladder appear unremarkable.

Stomach/Bowel: There is sigmoid diverticulosis without active
inflammatory changes. There is no bowel obstruction or active
inflammation. The appendix is normal.

Vascular/Lymphatic: An aorto bi iliac endovascular stent graft
repair noted. Evaluation of the vasculature is limited in the
absence of intravenous contrast. The IVC is unremarkable. No portal
venous gas. There is no adenopathy.

Reproductive: The prostate and seminal vesicles are grossly
unremarkable.

Other: None

Musculoskeletal: Osteopenia with degenerative changes of the spine.
No acute osseous pathology.
IMPRESSION: 1. Left renal mid to lower pole mass most consistent with
malignancy. Further evaluation with MRI without and with contrast on
a nonemergent basis recommended.
2. Cholelithiasis.
3. Sigmoid diverticulosis. No bowel obstruction. Normal appendix.
4. Aortic Atherosclerosis ([id number]).

## 2022-04-16 IMAGING — CT CT HEAD W/O CM
4 series · 16 of 47 positions shown, 18 images · non-contrast
Comparison: Brain MRI dated 10/14/2020

CLINICAL DATA: 87-year-old male with concern for intracranial
hemorrhage.

EXAM:
CT HEAD WITHOUT CONTRAST
TECHNIQUE: Contiguous axial images were obtained from the base of the skull
through the vertex without intravenous contrast.

[Series 3: head wo · axial · 0.47mm/px · z∈[-139,-19]mm · 7 of 33 slices shown, 9 images]
[im 5/33  brain]
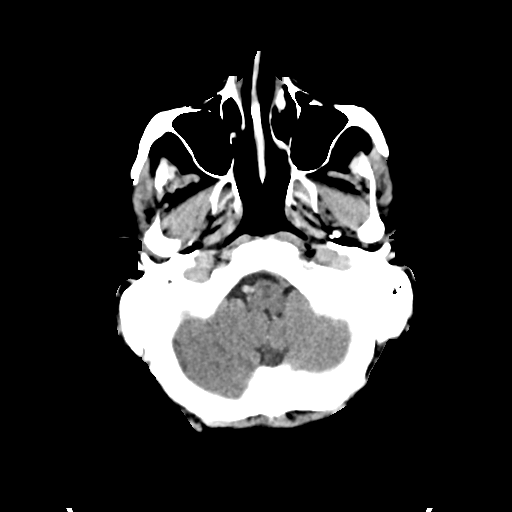
[im 5/33  bone]
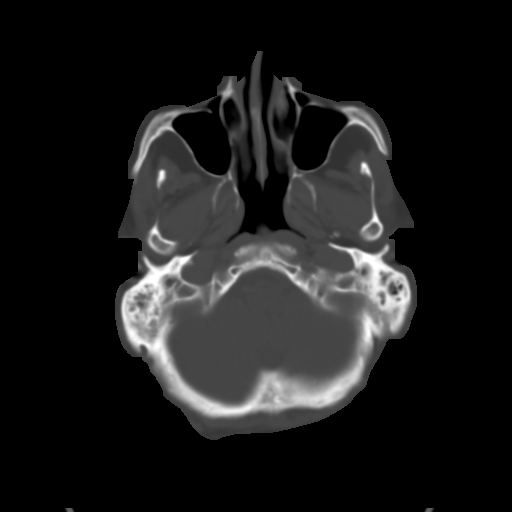
[im 9/33  brain]
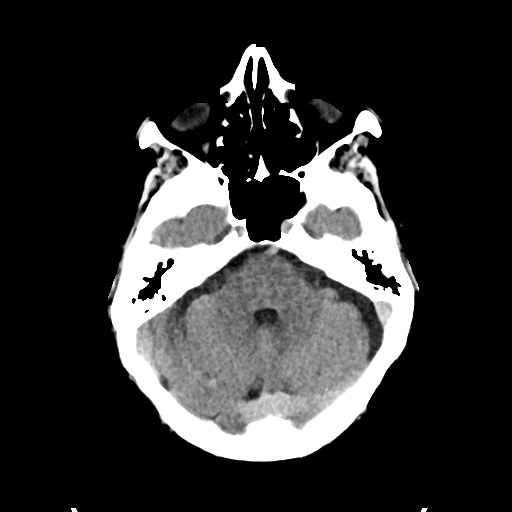
[im 13/33  brain]
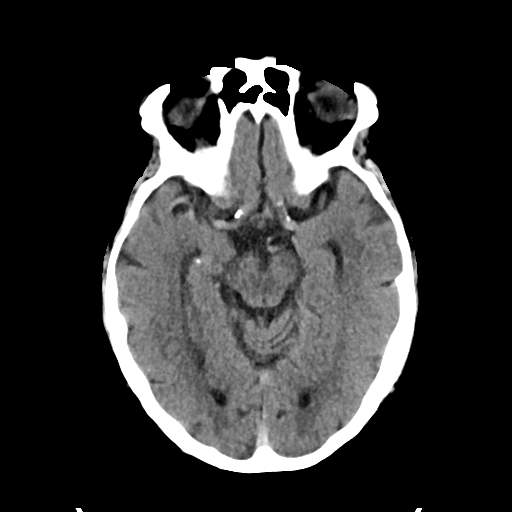
[im 17/33  brain]
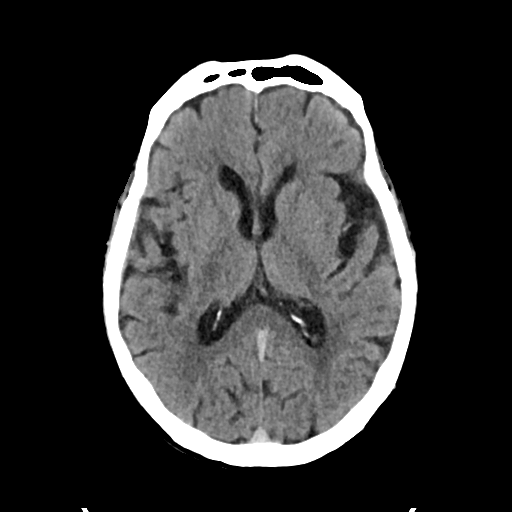
[im 21/33  brain]
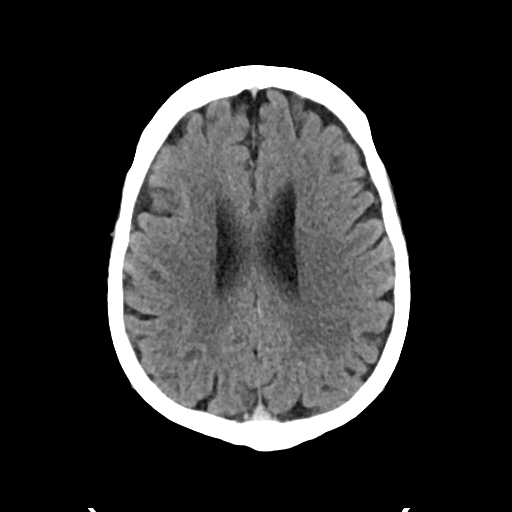
[im 21/33  bone]
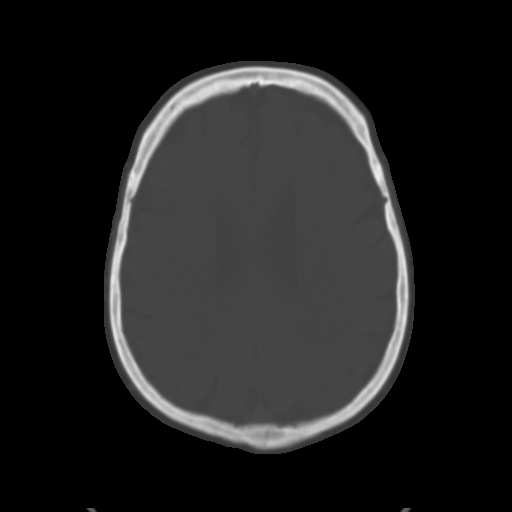
[im 25/33  brain]
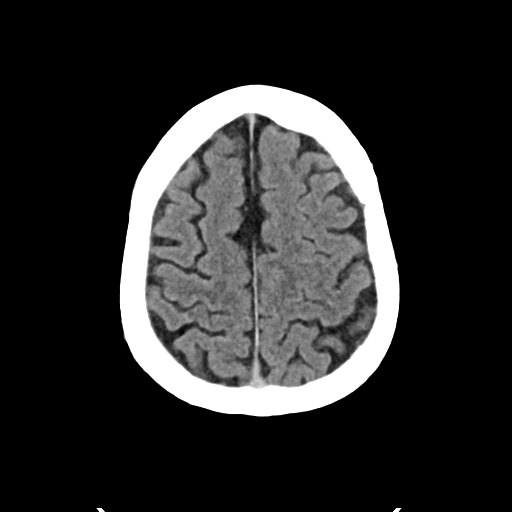
[im 29/33  brain]
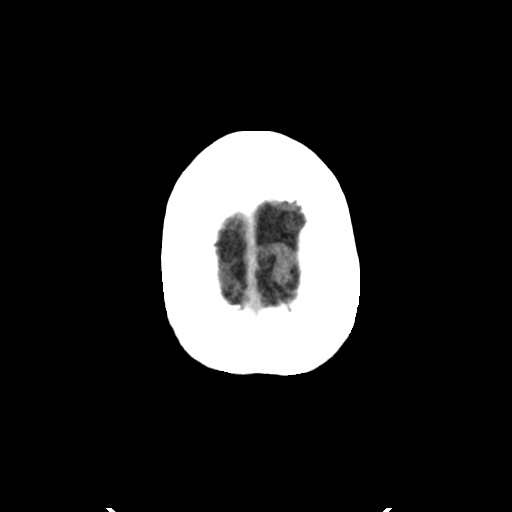

[Series 4: head bone · axial · 0.47mm/px · z∈[-143,-111]mm · 3 of 81 slices shown]
[im 9/81  bone]
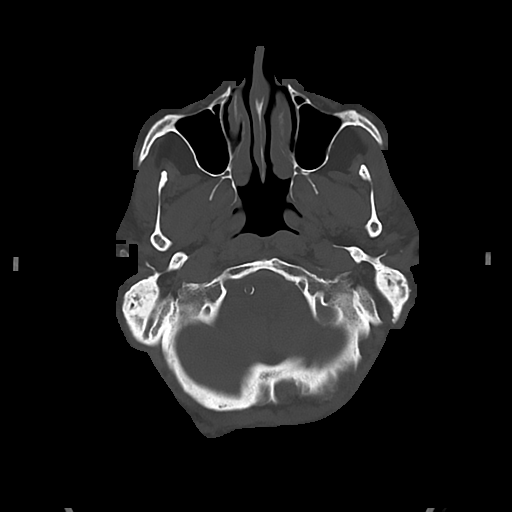
[im 17/81  bone]
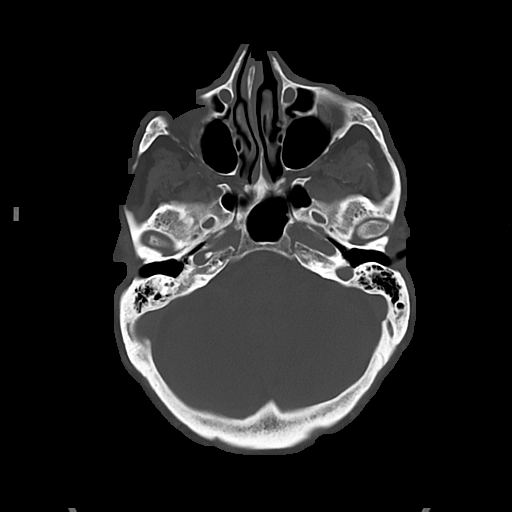
[im 25/81  bone]
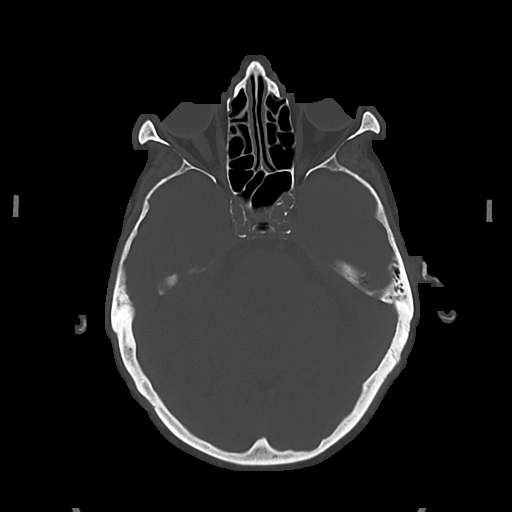

[Series 5: cor soft · coronal · 0.36mm/px · 3 of 71 slices shown]
[im 24/71  brain]
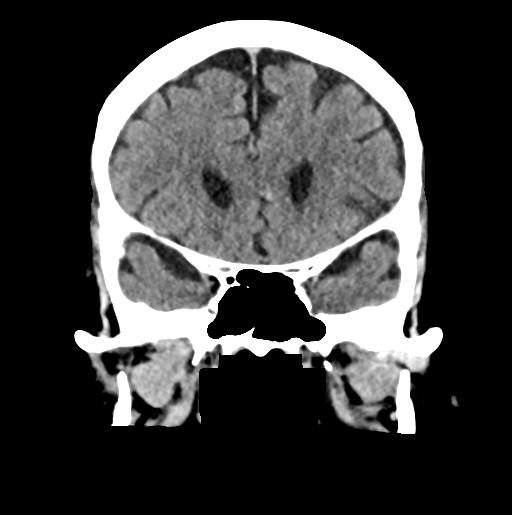
[im 32/71  brain]
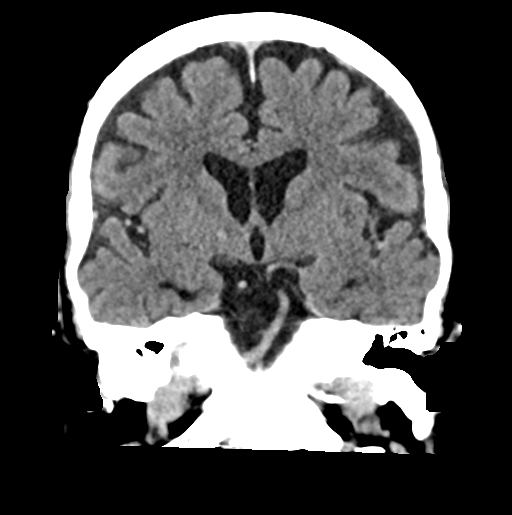
[im 39/71  brain]
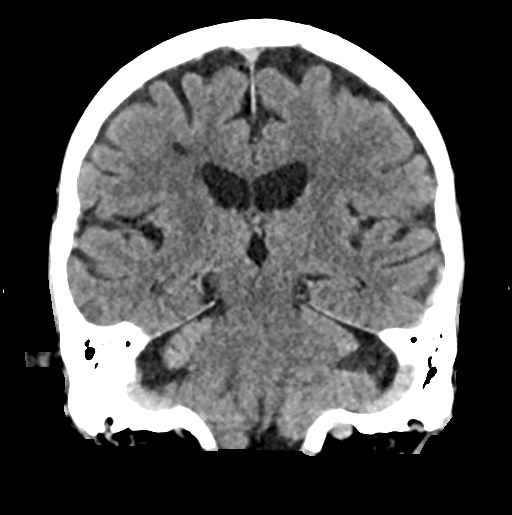

[Series 6: sag soft · sagittal · 0.31mm/px · 3 of 62 slices shown]
[im 21/62  brain]
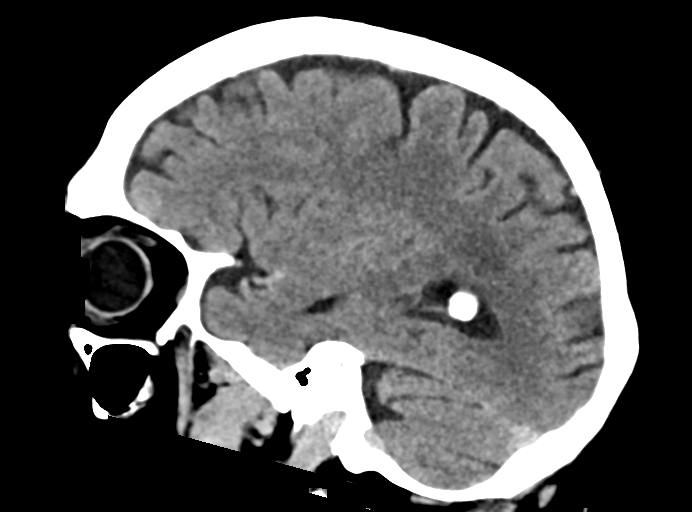
[im 31/62  brain]
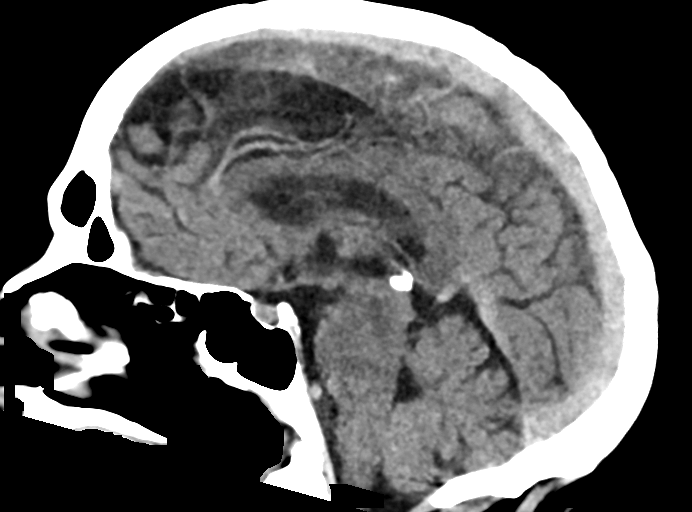
[im 41/62  brain]
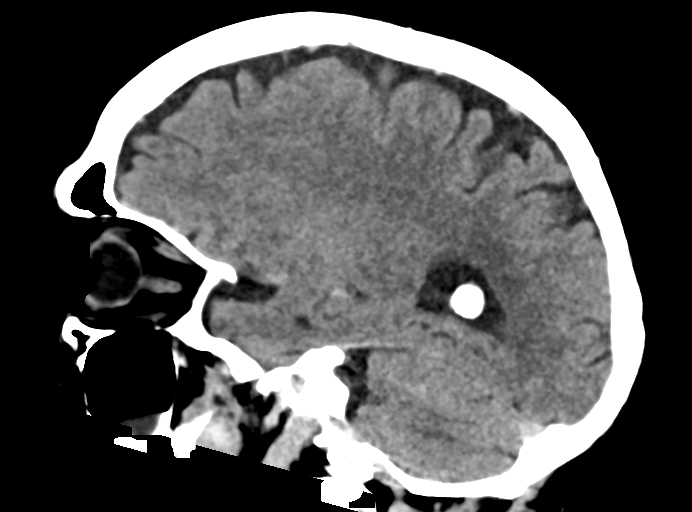

[16 of 47 positions shown; findings below may reference images not displayed]

FINDINGS: Brain: Mild age-related atrophy and chronic microvascular ischemic
changes. Probable punctate old lacunar infarct in the left corona
radiata. There is no acute intracranial hemorrhage. No mass effect
or midline shift. No extra-axial fluid collection.

Vascular: No hyperdense vessel or unexpected calcification.

Skull: Normal. Negative for fracture or focal lesion.

Sinuses/Orbits: Partial opacification of the left maxillary sinus.
No air-fluid level. Right mastoid effusion.

Other: None
IMPRESSION: 1. No acute intracranial pathology.
2. Mild age-related atrophy and chronic microvascular ischemic
changes.
